# Patient Record
Sex: Female | Born: 1980 | Hispanic: Yes | Marital: Married | Smoking: Never smoker
Health system: Southern US, Community
[De-identification: ages and names within clinical notes are randomized; demographics above are authoritative.]

## PROBLEM LIST (undated history)

## (undated) DIAGNOSIS — Z8041 Family history of malignant neoplasm of ovary: Secondary | ICD-10-CM

## (undated) DIAGNOSIS — D392 Neoplasm of uncertain behavior of placenta: Secondary | ICD-10-CM

## (undated) DIAGNOSIS — J029 Acute pharyngitis, unspecified: Secondary | ICD-10-CM

## (undated) DIAGNOSIS — Z1509 Genetic susceptibility to other malignant neoplasm: Principal | ICD-10-CM

## (undated) DIAGNOSIS — C801 Malignant (primary) neoplasm, unspecified: Secondary | ICD-10-CM

## (undated) DIAGNOSIS — Z1501 Genetic susceptibility to malignant neoplasm of breast: Secondary | ICD-10-CM

## (undated) HISTORY — PX: DILATION AND CURETTAGE OF UTERUS: SHX78

## (undated) HISTORY — DX: Family history of malignant neoplasm of ovary: Z80.41

## (undated) HISTORY — PX: BREAST BIOPSY: SHX20

## (undated) HISTORY — DX: Genetic susceptibility to malignant neoplasm of breast: Z15.09

## (undated) HISTORY — PX: WISDOM TOOTH EXTRACTION: SHX21

## (undated) HISTORY — DX: Neoplasm of uncertain behavior of placenta: D39.2

## (undated) HISTORY — DX: Genetic susceptibility to malignant neoplasm of breast: Z15.01

---

## 2001-10-17 ENCOUNTER — Ambulatory Visit (HOSPITAL_COMMUNITY): Admission: RE | Admit: 2001-10-17 | Discharge: 2001-10-17 | Payer: Self-pay | Admitting: Obstetrics and Gynecology

## 2001-10-17 ENCOUNTER — Encounter: Payer: Self-pay | Admitting: Obstetrics and Gynecology

## 2002-02-25 ENCOUNTER — Inpatient Hospital Stay (HOSPITAL_COMMUNITY): Admission: AD | Admit: 2002-02-25 | Discharge: 2002-02-28 | Payer: Self-pay | Admitting: *Deleted

## 2004-02-23 ENCOUNTER — Other Ambulatory Visit: Admission: RE | Admit: 2004-02-23 | Discharge: 2004-02-23 | Payer: Self-pay | Admitting: Obstetrics and Gynecology

## 2004-08-30 ENCOUNTER — Inpatient Hospital Stay (HOSPITAL_COMMUNITY): Admission: AD | Admit: 2004-08-30 | Discharge: 2004-09-03 | Payer: Self-pay | Admitting: Obstetrics and Gynecology

## 2004-10-13 ENCOUNTER — Other Ambulatory Visit: Admission: RE | Admit: 2004-10-13 | Discharge: 2004-10-13 | Payer: Self-pay | Admitting: Obstetrics and Gynecology

## 2010-03-04 ENCOUNTER — Encounter: Payer: Self-pay | Admitting: Family Medicine

## 2010-03-04 ENCOUNTER — Ambulatory Visit (INDEPENDENT_AMBULATORY_CARE_PROVIDER_SITE_OTHER): Payer: 59 | Admitting: Family Medicine

## 2010-03-04 DIAGNOSIS — W460XXA Contact with hypodermic needle, initial encounter: Secondary | ICD-10-CM

## 2010-03-04 DIAGNOSIS — J019 Acute sinusitis, unspecified: Secondary | ICD-10-CM

## 2010-03-04 DIAGNOSIS — J018 Other acute sinusitis: Secondary | ICD-10-CM | POA: Insufficient documentation

## 2010-03-04 DIAGNOSIS — S4980XA Other specified injuries of shoulder and upper arm, unspecified arm, initial encounter: Secondary | ICD-10-CM

## 2010-03-04 LAB — CONVERTED CEMR LAB
HCV Ab: NEGATIVE
Hep B S Ab: POSITIVE — AB
Hepatitis B Surface Ag: NEGATIVE

## 2010-03-10 NOTE — Assessment & Plan Note (Signed)
Summary: NEW PT TO EST/CLE   Vital Signs:  Patient profile:   30 year old female Height:      67 inches Weight:      163.50 pounds BMI:     25.70 Temp:     97.9 degrees F oral Pulse rate:   86 / minute Pulse rhythm:   regular BP sitting:   118 / 80  (left arm) Cuff size:   regular  Vitals Entered By: Linde Gillis CMA Duncan Dull) (March 04, 2010 11:14 AM) CC: new patient, establish care   History of Present Illness: 30 yo here to establish care.  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 Six month labs were normal but was advised to have them repeated at 1 year. Would like them repeated today.  ?sinus infection- past month, has had a URI- started with runny nose. Now has sinus pressure and body aches. NO fevers or chills. No cough.  No CP.  She does not believe she has seasonal allergies but eyes do water at work from time to time.  Preventive Screening-Counseling & Management  Alcohol-Tobacco     Smoking Status: never  Caffeine-Diet-Exercise     Does Patient Exercise: yes      Drug Use:  no.    Current Medications (verified): 1)  Azithromycin 250 Mg  Tabs (Azithromycin) .... 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 Eyes:  Denies blurring. ENT:  Complains of nasal congestion and sinus pressure. CV:  Denies chest pain or discomfort. Resp:   Denies shortness of breath. GI:  Denies abdominal pain and change in bowel habits. GU:  Denies abnormal vaginal bleeding and dysuria. MS:  Denies joint pain, joint redness, and joint swelling. Derm:  Denies rash. Neuro:  Denies headaches. Psych:  Denies anxiety and depression. Endo:  Denies cold intolerance and heat intolerance. Heme:  Denies abnormal bruising and bleeding.  Physical Exam  General:  alert, well-developed, and well-nourished.   Head:  normocephalic and atraumatic.   Eyes:  vision grossly intact, pupils equal, and pupils round.   Ears:  R ear normal and L ear normal.   Nose:  boggy turbinates sinuses TTP- frontal, maxillary  Mouth:  good dentition and pharynx pink and moist.   Neck:  No deformities, masses, or tenderness noted. Lungs:  Normal respiratory effort, chest expands symmetrically. Lungs are clear to auscultation, no crackles or wheezes. Heart:  Normal rate and regular rhythm. S1 and S2 normal without gallop, murmur, click, rub or other extra sounds. Abdomen:  Bowel sounds positive,abdomen soft and non-tender without masses, organomegaly or hernias noted. Msk:  No deformity or scoliosis noted of thoracic or lumbar spine.   Extremities:  No clubbing, cyanosis, edema, or deformity noted with normal full range of motion of all joints.   Neurologic:  No cranial nerve deficits noted. Station and gait are normal. Plantar reflexes are down-going  bilaterally. DTRs are symmetrical throughout. Sensory, motor and coordinative functions appear intact. Skin:  Intact without suspicious lesions or rashes Cervical Nodes:  No lymphadenopathy noted Psych:  Cognition and judgment appear intact. Alert and cooperative with normal attention span and concentration. 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 Orders: Venipuncture (04540) T-Hepatitis B Core Antibody, IGM (98119-14782) T-Hepatitis B Surface Antibody (95621-30865) T-Hepatitis C Antibody (78469-62952) T-Hepatitis B Surface Antigen (84132-44010) T-HIV Antibody  (Reflex) ([id number])  Problem # 2:  OTHER ACUTE SINUSITIS (ICD-461.8) Assessment: New Given duration and progression of symptoms, will treat for bacterial sinusitis with Zpack. Advised OTC decongestant/allergy medication as well. Her updated medication list for this problem includes:    Azithromycin 250 Mg Tabs (Azithromycin) .Marland Kitchen... 2 by  mouth today and then 1 daily for 4 days  Complete Medication List: 1)  Azithromycin 250 Mg Tabs (Azithromycin) .... 2 by  mouth today and then 1 daily for 4 days Prescriptions: AZITHROMYCIN 250 MG  TABS (AZITHROMYCIN) 2 by  mouth today and then 1 daily for 4 days  #6 x 0   Entered and Authorized by:   Ruthe Mannan MD   Signed by:   Ruthe Mannan MD on 03/04/2010   Method used:   Electronically to        [facility]  [address] [id number] (retail)       [address]       [place], [place]  [address]       Ph: [phone] or [phone]       Fax: [phone]   RxID:   [id number]    Orders Added: 1)  Venipuncture [37628] 2)  T-Hepatitis B Core Antibody, IGM [86705-23580] 3)  T-Hepatitis B Surface Antibody [[id number]] 4)  T-Hepatitis C Antibody [86803-23620] 5)  T-Hepatitis B Surface Antigen [37106-26948] 6)  T-HIV Antibody  (Reflex) [[id number]] 7)  New Patient Level II [99202]    Prior Medications (reviewed today): None Current Allergies (reviewed today): No known allergies

## 2010-03-22 ENCOUNTER — Encounter: Payer: Self-pay | Admitting: Family Medicine

## 2010-04-13 NOTE — Letter (Signed)
Summary: [facility]   Imported By: Kassie Mends 04/05/2010 11:14:44  _____________________________________________________________________  External Attachment:    Type:   Image     Comment:   External Document

## 2010-08-09 ENCOUNTER — Encounter (HOSPITAL_COMMUNITY): Payer: Self-pay | Admitting: *Deleted

## 2010-08-09 ENCOUNTER — Ambulatory Visit (HOSPITAL_COMMUNITY)
Admit: 2010-08-09 | Discharge: 2010-08-09 | Disposition: A | Discharge status: Home / Self Care | Payer: 59 | Source: Ambulatory Visit | Attending: Obstetrics and Gynecology | Admitting: Obstetrics and Gynecology

## 2010-08-10 ENCOUNTER — Other Ambulatory Visit (HOSPITAL_COMMUNITY): Payer: Self-pay | Admitting: Obstetrics and Gynecology

## 2010-08-10 ENCOUNTER — Ambulatory Visit (HOSPITAL_COMMUNITY)
Admission: RE | Admit: 2010-08-10 | Discharge: 2010-08-10 | Disposition: A | Discharge status: Home / Self Care | Payer: 59 | Source: Ambulatory Visit | Attending: Obstetrics and Gynecology | Admitting: Obstetrics and Gynecology

## 2010-08-10 DIAGNOSIS — O020 Blighted ovum and nonhydatidiform mole: Secondary | ICD-10-CM

## 2010-08-10 DIAGNOSIS — O0289 Other abnormal products of conception: Secondary | ICD-10-CM | POA: Insufficient documentation

## 2010-08-10 DIAGNOSIS — O019 Hydatidiform mole, unspecified: Secondary | ICD-10-CM | POA: Insufficient documentation

## 2010-08-10 DIAGNOSIS — Z01811 Encounter for preprocedural respiratory examination: Secondary | ICD-10-CM | POA: Insufficient documentation

## 2010-08-10 NOTE — Anesthesia Preprocedure Evaluation (Addendum)
Anesthesia Evaluation  Name, MR# and DOB Patient awake  General Assessment Comment  Reviewed: Allergy & Precautions, H&P  and Patient's Chart, lab work & pertinent test results  Airway Mallampati: II TM Distance: >3 FB Neck ROM: Full    Dental No notable dental hx (+) Teeth Intact   Pulmonaryneg pulmonary ROS    clear to auscultation  pulmonary exam normal   Cardiovascular Regular Normal   Neuro/Psych  GI/Hepatic/Renal negative GI ROS, negative Liver ROS, and negative Renal ROS (+)       Endo/Other  Negative Endocrine ROS (+)   Abdominal Normal abdominal exam  (+)   Musculoskeletal negative musculoskeletal ROS (+)  Hematology negative hematology ROS (+)   Peds  Reproductive/Obstetrics (+) Pregnancy (Suspected Molar Pregnancy. Hcg measures 22wks)   Anesthesia Other Findings             Anesthesia Physical Anesthesia Plan  ASA: II  Anesthesia Plan: General   Post-op Pain Management:    Induction: Intravenous  Airway Management Planned: Oral ETT  Additional Equipment:   Intra-op Plan:   Post-operative Plan: Extubation in OR  Informed Consent: I have reviewed the patients History and Physical, chart, labs and discussed the procedure including the risks, benefits and alternatives for the proposed anesthesia with the patient or authorized representative who has indicated his/her understanding and acceptance.   Dental advisory given  Plan Discussed with:   Anesthesia Plan Comments:         Anesthesia Quick Evaluation

## 2010-08-12 ENCOUNTER — Encounter (HOSPITAL_COMMUNITY): Payer: Self-pay | Admitting: Anesthesiology

## 2010-08-12 ENCOUNTER — Other Ambulatory Visit: Payer: Self-pay | Admitting: Obstetrics and Gynecology

## 2010-08-12 ENCOUNTER — Ambulatory Visit (HOSPITAL_COMMUNITY)
Admission: RE | Admit: 2010-08-12 | Discharge: 2010-08-12 | Disposition: A | Discharge status: Home / Self Care | Payer: 59 | Source: Ambulatory Visit | Attending: Obstetrics and Gynecology | Admitting: Obstetrics and Gynecology

## 2010-08-12 ENCOUNTER — Ambulatory Visit (HOSPITAL_COMMUNITY): Payer: 59 | Admitting: Anesthesiology

## 2010-08-12 ENCOUNTER — Encounter (HOSPITAL_COMMUNITY)
Admission: RE | Disposition: A | Discharge status: Home / Self Care | Payer: Self-pay | Source: Ambulatory Visit | Attending: Obstetrics and Gynecology

## 2010-08-12 ENCOUNTER — Ambulatory Visit (HOSPITAL_COMMUNITY)
Admission: RE | Admit: 2010-08-12 | Discharge: 2010-08-12 | Disposition: A | Discharge status: Home / Self Care | Payer: 59 | Source: Ambulatory Visit

## 2010-08-12 ENCOUNTER — Encounter (HOSPITAL_COMMUNITY): Payer: Self-pay | Admitting: *Deleted

## 2010-08-12 DIAGNOSIS — O021 Missed abortion: Secondary | ICD-10-CM | POA: Insufficient documentation

## 2010-08-12 HISTORY — PX: DILATION AND EVACUATION: SHX1459

## 2010-08-12 LAB — CBC
HCT: 30.9 % — ABNORMAL LOW (ref 36.0–46.0)
HCT: 29.9 % — ABNORMAL LOW (ref 36.0–46.0)
Hemoglobin: 10.7 g/dL — ABNORMAL LOW (ref 12.0–15.0)
Hemoglobin: 10.3 g/dL — ABNORMAL LOW (ref 12.0–15.0)
MCH: 30.6 pg (ref 26.0–34.0)
MCH: 30.6 pg (ref 26.0–34.0)
MCHC: 34.6 g/dL (ref 30.0–36.0)
RBC: 3.5 MIL/uL — ABNORMAL LOW (ref 3.87–5.11)
RBC: 3.37 MIL/uL — ABNORMAL LOW (ref 3.87–5.11)

## 2010-08-12 LAB — TYPE AND SCREEN: Antibody Screen: NEGATIVE

## 2010-08-12 LAB — SURGICAL PCR SCREEN
MRSA, PCR: NEGATIVE
Staphylococcus aureus: NEGATIVE

## 2010-08-12 LAB — ABO/RH: ABO/RH(D): O POS

## 2010-08-12 SURGERY — DILATION AND EVACUATION, UTERUS
Anesthesia: General | Site: Vagina | Wound class: Clean Contaminated

## 2010-08-12 MED ORDER — FENTANYL CITRATE 0.05 MG/ML IJ SOLN: 25.0000 ug | INTRAMUSCULAR | Status: DC | PRN

## 2010-08-12 MED ORDER — PROPOFOL 10 MG/ML IV EMUL
INTRAVENOUS | Status: DC | PRN
  Administered 2010-08-12: 180 mg via INTRAVENOUS

## 2010-08-12 MED ORDER — FENTANYL CITRATE 0.05 MG/ML IJ SOLN
INTRAMUSCULAR | Status: AC
  Filled 2010-08-12: qty 5

## 2010-08-12 MED ORDER — PROPOFOL 10 MG/ML IV EMUL
INTRAVENOUS | Status: AC
  Filled 2010-08-12: qty 20

## 2010-08-12 MED ORDER — LIDOCAINE HCL (CARDIAC) 10 MG/ML IV SOLN
INTRAVENOUS | Status: DC | PRN
  Administered 2010-08-12: 50 mg via INTRAVENOUS

## 2010-08-12 MED ORDER — NEOSTIGMINE METHYLSULFATE 1 MG/ML IJ SOLN
INTRAMUSCULAR | Status: DC | PRN
  Administered 2010-08-12: 3 mg via INTRAMUSCULAR

## 2010-08-12 MED ORDER — MIDAZOLAM HCL 2 MG/2ML IJ SOLN
INTRAMUSCULAR | Status: AC
  Filled 2010-08-12: qty 2

## 2010-08-12 MED ORDER — LACTATED RINGERS IV SOLN
INTRAVENOUS | Status: DC
  Administered 2010-08-12 (×2): via INTRAVENOUS

## 2010-08-12 MED ORDER — DEXAMETHASONE SODIUM PHOSPHATE 10 MG/ML IJ SOLN
INTRAMUSCULAR | Status: AC
  Filled 2010-08-12: qty 1

## 2010-08-12 MED ORDER — SCOPOLAMINE 1 MG/3DAYS TD PT72
MEDICATED_PATCH | TRANSDERMAL | Status: AC
  Administered 2010-08-12: 1.5 mg via TRANSDERMAL
  Filled 2010-08-12: qty 1

## 2010-08-12 MED ORDER — FENTANYL CITRATE 0.05 MG/ML IJ SOLN
INTRAMUSCULAR | Status: DC | PRN
  Administered 2010-08-12: 50 ug via INTRAVENOUS
  Administered 2010-08-12: 15 ug via INTRAVENOUS

## 2010-08-12 MED ORDER — NEOSTIGMINE METHYLSULFATE 1 MG/ML IJ SOLN
INTRAMUSCULAR | Status: AC
  Filled 2010-08-12: qty 10

## 2010-08-12 MED ORDER — GLYCOPYRROLATE 0.2 MG/ML IJ SOLN
INTRAMUSCULAR | Status: AC
  Filled 2010-08-12: qty 1

## 2010-08-12 MED ORDER — OXYTOCIN 10 UNIT/ML IJ SOLN
INTRAMUSCULAR | Status: AC
  Filled 2010-08-12: qty 2

## 2010-08-12 MED ORDER — MIDAZOLAM HCL 5 MG/5ML IJ SOLN
INTRAMUSCULAR | Status: DC | PRN
  Administered 2010-08-12: 2 mg via INTRAVENOUS

## 2010-08-12 MED ORDER — CARBOPROST TROMETHAMINE 250 MCG/ML IM SOLN
INTRAMUSCULAR | Status: AC
  Filled 2010-08-12: qty 1

## 2010-08-12 MED ORDER — LIDOCAINE HCL (CARDIAC) 20 MG/ML IV SOLN
INTRAVENOUS | Status: AC
  Filled 2010-08-12: qty 5

## 2010-08-12 MED ORDER — ONDANSETRON HCL 4 MG/2ML IJ SOLN
INTRAMUSCULAR | Status: AC
  Filled 2010-08-12: qty 2

## 2010-08-12 MED ORDER — SCOPOLAMINE 1 MG/3DAYS TD PT72
1.0000 | MEDICATED_PATCH | TRANSDERMAL | Status: DC
  Administered 2010-08-12: 1.5 mg via TRANSDERMAL
  Filled 2010-08-12: qty 1

## 2010-08-12 MED ORDER — ROCURONIUM BROMIDE 100 MG/10ML IV SOLN
INTRAVENOUS | Status: DC | PRN
  Administered 2010-08-12: 30 mg via INTRAVENOUS

## 2010-08-12 MED ORDER — SODIUM CHLORIDE 0.9 % IR SOLN
Status: DC | PRN
  Administered 2010-08-12: 1000 mL

## 2010-08-12 MED ORDER — MUPIROCIN 2 % EX OINT
TOPICAL_OINTMENT | CUTANEOUS | Status: AC
  Administered 2010-08-12: 1 via NASAL
  Filled 2010-08-12: qty 22

## 2010-08-12 MED ORDER — METHYLERGONOVINE MALEATE 0.2 MG PO TABS: 0.2000 mg | ORAL_TABLET | Freq: Three times a day (TID) | ORAL | Status: DC | PRN

## 2010-08-12 MED ORDER — DEXAMETHASONE SODIUM PHOSPHATE 10 MG/ML IJ SOLN
INTRAMUSCULAR | Status: DC | PRN
  Administered 2010-08-12: 10 mg via INTRAVENOUS

## 2010-08-12 MED ORDER — GLYCOPYRROLATE 0.2 MG/ML IJ SOLN
INTRAMUSCULAR | Status: DC | PRN
  Administered 2010-08-12: 0.3 mg via INTRAVENOUS

## 2010-08-12 MED ORDER — ONDANSETRON HCL 4 MG/2ML IJ SOLN
INTRAMUSCULAR | Status: DC | PRN
  Administered 2010-08-12: 4 mg via INTRAVENOUS

## 2010-08-12 MED ORDER — OXYTOCIN 20 UNITS IN LACTATED RINGERS INFUSION - SIMPLE
INTRAVENOUS | Status: DC | PRN
  Administered 2010-08-12: 20 [IU] via INTRAVENOUS

## 2010-08-12 MED ORDER — CARBOPROST TROMETHAMINE 250 MCG/ML IM SOLN
INTRAMUSCULAR | Status: DC | PRN
  Administered 2010-08-12: 250 ug via INTRAMUSCULAR

## 2010-08-12 SURGICAL SUPPLY — 60 items
CANISTER SUCTION 2500CC (MISCELLANEOUS) ×3 IMPLANT
CATH FOLEY 3WAY  5CC 16FR (CATHETERS)
CATH FOLEY 3WAY 5CC 16FR (CATHETERS) IMPLANT
CATH ROBINSON RED A/P 16FR (CATHETERS) IMPLANT
CLOTH BEACON ORANGE TIMEOUT ST (SAFETY) ×3 IMPLANT
CONT PATH 16OZ SNAP LID 3702 (MISCELLANEOUS) IMPLANT
CONTAINER PREFILL 10% NBF 15ML (MISCELLANEOUS) IMPLANT
COUNTER NEEDLE 1200 MAGNETIC (NEEDLE) ×3 IMPLANT
DECANTER SPIKE VIAL GLASS SM (MISCELLANEOUS) ×3 IMPLANT
DRAPE CAMERA CLOSED 9X96 (DRAPES) IMPLANT
DRAPE UTILITY XL STRL (DRAPES) ×3 IMPLANT
DRSG VASELINE 3X18 (GAUZE/BANDAGES/DRESSINGS) IMPLANT
ELECT BLADE 6 FLAT ULTRCLN (ELECTRODE) IMPLANT
FILTER STRAW FLUID ASPIR (MISCELLANEOUS) IMPLANT
GAUZE SPONGE 4X4 12PLY STRL LF (GAUZE/BANDAGES/DRESSINGS) IMPLANT
GAUZE SPONGE 4X4 16PLY XRAY LF (GAUZE/BANDAGES/DRESSINGS) IMPLANT
GLOVE BIO SURGEON STRL SZ7.5 (GLOVE) ×6 IMPLANT
GOWN BRE IMP SLV AUR LG STRL (GOWN DISPOSABLE) ×6 IMPLANT
GOWN BRE IMP SLV AUR XL STRL (GOWN DISPOSABLE) ×3 IMPLANT
KIT BERKELEY 1ST TRIMESTER 3/8 (MISCELLANEOUS) ×3 IMPLANT
NEEDLE HYPO 25X1 1.5 SAFETY (NEEDLE) IMPLANT
NEEDLE SPNL 22GX3.5 QUINCKE BK (NEEDLE) ×3 IMPLANT
NS IRRIG 1000ML POUR BTL (IV SOLUTION) ×3 IMPLANT
PACK ABDOMINAL GYN (CUSTOM PROCEDURE TRAY) IMPLANT
PACK VAGINAL MINOR WOMEN LF (CUSTOM PROCEDURE TRAY) ×3 IMPLANT
PAD OB MATERNITY 4.3X12.25 (PERSONAL CARE ITEMS) ×3 IMPLANT
PAD PREP 24X48 CUFFED NSTRL (MISCELLANEOUS) ×3 IMPLANT
PLUG CATH AND CAP STER (CATHETERS) IMPLANT
RETRACTOR WND ALEXIS 25 LRG (MISCELLANEOUS) IMPLANT
RTRCTR WOUND ALEXIS 25CM LRG (MISCELLANEOUS)
SET BERKELEY SUCTION TUBING (SUCTIONS) ×3 IMPLANT
SET CYSTO W/LG BORE CLAMP LF (SET/KITS/TRAYS/PACK) IMPLANT
SPONGE LAP 18X18 X RAY DECT (DISPOSABLE) IMPLANT
SPONGE LAP 4X18 X RAY DECT (DISPOSABLE) IMPLANT
STAPLER VISISTAT 35W (STAPLE) IMPLANT
SUT CHROMIC 2 0 CT 1 (SUTURE) ×3 IMPLANT
SUT PDS AB 0 CTX 60 (SUTURE) IMPLANT
SUT VIC AB 0 CT1 18XCR BRD8 (SUTURE) IMPLANT
SUT VIC AB 0 CT1 27 (SUTURE)
SUT VIC AB 0 CT1 27XBRD ANBCTR (SUTURE) IMPLANT
SUT VIC AB 0 CT1 36 (SUTURE) IMPLANT
SUT VIC AB 0 CT1 8-18 (SUTURE)
SUT VIC AB 3-0 CTX 36 (SUTURE) IMPLANT
SUT VIC AB 3-0 SH 27 (SUTURE)
SUT VIC AB 3-0 SH 27X BRD (SUTURE) IMPLANT
SUT VICRYL 0 TIES 12 18 (SUTURE) IMPLANT
SYR 3ML LL SCALE MARK (SYRINGE) IMPLANT
SYR CONTROL 10ML LL (SYRINGE) ×3 IMPLANT
TOWEL OR 17X24 6PK STRL BLUE (TOWEL DISPOSABLE) ×6 IMPLANT
TRAY FOLEY CATH 14FR (SET/KITS/TRAYS/PACK) ×3 IMPLANT
TUBE VACURETTE 2ND TRIMESTER (CANNULA) ×3 IMPLANT
TUBING NON-CON 1/4 X 20 CONN (TUBING) ×3 IMPLANT
VACURETTE 10 RIGID CVD (CANNULA) IMPLANT
VACURETTE 12 RIGID CVD (CANNULA) ×3 IMPLANT
VACURETTE 14MM CVD 1/2 BASE (CANNULA) ×3 IMPLANT
VACURETTE 7MM CVD STRL WRAP (CANNULA) IMPLANT
VACURETTE 8 RIGID CVD (CANNULA) IMPLANT
VACURETTE 9 RIGID CVD (CANNULA) IMPLANT
WATER STERILE IRR 1000ML POUR (IV SOLUTION) IMPLANT
YANKAUER SUCT BULB TIP NO VENT (SUCTIONS) ×3 IMPLANT

## 2010-08-12 NOTE — Op Note (Signed)
This is Dr. Ambrose Mantle dictating an operative note on  [name] date of the operation 08/12/2010  Preoperative diagnosis: Molar pregnancy  Postoperative diagnosis: Same  Operation: Suction D&C followed by sharp D&C both under ultrasound guidance.  Anesthesia: Gen. By Dr. Malen Gauze  The patient was brought to the operating room placed under satisfactory general anesthesia and placed in lithotomy position in the [facility] stirrups. The ultrasound technologist was  in the room and identified the molar pregnancy. The uterus was approximately 18-20 weeks size and was palpable at the umbilicus. The vulva vagina, perineum, lower abdomen, and medial thighs were prepped with Betadine solution. The cervix was grasped with a single-tooth tenaculum and the cervical canal was dilated up to a 43 dilator. A #14 suction curette was inserted into the uterine cavity under US guidance . We encountered fairly significant bleeding as I was dilating the cervix but after I began the suction the bleeding diminished. The uterine size rapidly became almost normal. Pitocin was begun in the IV fluid when I began to dilate the cervix and during the procedure the anesthetist gave 1 ampule of Hemabate. At this point the endometrial thickness at the fundus was a little over 2 cm so I went back under ultrasound guidance and tried to evacuate all tissue that was present I massaged the uterus to obtain hemostasis. There did not seem to be any significant amount of tissue left in the uterus at the end of the procedure. I sutured one tenaculum site for hemostasis and the procedure was terminated. I inspected the products of conception and the tissue appeared to be consistent with placenta. I did not inspect the entire specimen to see if there was anything that appeared similar. to grapes.I inspected both suction devices measured the fluid present and the total was 825 cc. I do not know what percentage of that was new and old blood the patient was  returned to recovery in satisfactory condition without complications.

## 2010-08-12 NOTE — H&P (Signed)
Dictated on 08-11-10. Will be added to Epic after surgery.

## 2010-08-12 NOTE — Transfer of Care (Signed)
Immediate Anesthesia Transfer of Care Note  Patient: [name]  Procedure(s) Performed:  DILATATION AND EVACUATION (D&E)  Patient Location: PACU  Anesthesia Type: General  Level of Consciousness: awake, alert  and oriented  Airway & Oxygen Therapy: Patient Spontanous Breathing and Patient connected to nasal cannula oxygen  Post-op Assessment: Report given to PACU RN  Post vital signs: Reviewed and stable  Complications: No apparent anesthesia complications

## 2010-08-12 NOTE — Anesthesia Postprocedure Evaluation (Signed)
  Anesthesia Post-op Note  Patient: [name]  Procedure(s) Performed:  DILATATION AND EVACUATION (D&E)  Patient Location: PACU  Anesthesia Type: General  Level of Consciousness: awake, alert  and oriented  Airway and Oxygen Therapy: Patient Spontanous Breathing  Post-op Pain: none  Post-op Assessment: Post-op Vital signs reviewed, Patient's Cardiovascular Status Stable, Respiratory Function Stable, Patent Airway, No signs of Nausea or vomiting and Pain level controlled  Post-op Vital Signs: Reviewed and stable  Complications: No apparent anesthesia complications

## 2010-08-13 MED ORDER — LACTATED RINGERS IV SOLN: INTRAVENOUS | Status: DC

## 2010-08-13 NOTE — H&P (Signed)
NAMECONSWELLA, [name]             ACCOUNT NO.:  [id number]  MEDICAL RECORD NO.:  [id number]  LOCATION:                                 FACILITY:  PHYSICIAN:  Malachi Pro. Ambrose Mantle, M.D.      DATE OF BIRTH:  DATE OF ADMISSION:  08/12/2010 DATE OF DISCHARGE:                             HISTORY & PHYSICAL   HISTORY OF PRESENT ILLNESS:  This is a 30 year old Hispanic female para 2-0-0-2, gravida 3 who is admitted for removal of a molar pregnancy. The patient's last menstrual period was May 31, 2010.  A beta-HCG on July 30, 2010 was 244,[phone].  An ultrasound was performed that showed a molar pregnancy enlarging the uterus up to the umbilicus.  She has had some bleeding that began approximately the middle of June and actually on August 10, 2010 had some small clots.  PAST MEDICAL HISTORY:  ALLERGIES:  No known allergies.  PAST SURGIES:  She has had 2 C-sections.  PAST ILLNESSES:  She has had no significant illnesses.  SOCIAL HISTORY:  She drinks occasionally.  Does not smoke or take drugs.  FAMILY HISTORY:  Mother is 6, living and well.  Father 70, unknown health.  One brother and sister are living and well.  OBSTETRICAL HISTORY:  In 2004, the patient had a primary C-section with nonreassuring fetal heart rate pattern and in 2006, she had a failed VBAC and had the C-section for fetal tachycardia nonreassuring, tracing meconium, and occiput posterior presentation.  PHYSICAL EXAMINATION:  VITAL SIGNS:  Her blood pressure is 120/78, weight is 170, and pulse is 70. HEAD, EYES, EARS, NOSE AND THROAT:  Normal. NECK:  Supple without thyromegaly. LUNGS:  Clear to auscultation. HEART:  Normal size and sounds.  No murmurs. ABDOMEN:  Soft.  The fundal height was 12 cm on August 06, 2010, today July 11 is 13 cm.  The vulva and vagina are normal.  There is a slight amount of bloody discharge in the vagina.  The cervix is closed.  Uterus is anterior, felt to be 18-20 weeks size.  The adnexa are  free of masses as far as they can be examined.  LABORATORY DATA:  The patient's TSH was markedly depressed suggesting hyperthyroidism but her free T4 was only 1.8 which was 300s of a point above the upper limit of normal.  She has no signs or symptoms of hyperthyroidism.  A chest x-ray was normal.  Her CBC and comprehensive metabolic profile were normal.  She has been typed and screened.  ASSESSMENT AND PLAN:  We will proceed with suction dilatation and curettage ultrasound control.  If that is unsuccessful, we will proceed with hysterotomy and possible hysterectomy.  The patient has been informed of the risks of surgery including but not limited to deep venous thrombosis, hemorrhage with need for reoperation or hysterectomy or hysterotomy.  She has been informed that there could be injury to surrounding tissues including the bowel, bladder, and ureter and she understands the potential that this could eventuate in a malignancy and the need for contraception if the uterus is preserved and for close followup to ensure that her HCG titers return to normal.     Malachi Pro.  Ambrose Mantle, M.D.     TFH/MEDQ  D:  08/11/2010  T:  08/11/2010  Job:  [id number]

## 2010-08-31 ENCOUNTER — Encounter (HOSPITAL_COMMUNITY): Payer: Self-pay | Admitting: Obstetrics and Gynecology

## 2010-09-06 ENCOUNTER — Other Ambulatory Visit (HOSPITAL_COMMUNITY): Payer: Self-pay | Admitting: Obstetrics and Gynecology

## 2010-09-06 DIAGNOSIS — O020 Blighted ovum and nonhydatidiform mole: Secondary | ICD-10-CM

## 2010-09-10 ENCOUNTER — Ambulatory Visit (HOSPITAL_COMMUNITY)
Admission: RE | Admit: 2010-09-10 | Discharge: 2010-09-10 | Disposition: A | Discharge status: Home / Self Care | Payer: 59 | Source: Ambulatory Visit | Attending: Obstetrics and Gynecology | Admitting: Obstetrics and Gynecology

## 2010-09-10 ENCOUNTER — Other Ambulatory Visit (HOSPITAL_COMMUNITY): Payer: Self-pay | Admitting: Obstetrics and Gynecology

## 2010-09-10 DIAGNOSIS — O020 Blighted ovum and nonhydatidiform mole: Secondary | ICD-10-CM

## 2010-09-10 DIAGNOSIS — O0289 Other abnormal products of conception: Secondary | ICD-10-CM | POA: Insufficient documentation

## 2010-09-13 ENCOUNTER — Other Ambulatory Visit (HOSPITAL_COMMUNITY): Payer: Self-pay | Admitting: Obstetrics and Gynecology

## 2010-09-13 ENCOUNTER — Encounter (HOSPITAL_COMMUNITY): Payer: Self-pay

## 2010-09-13 ENCOUNTER — Ambulatory Visit (HOSPITAL_COMMUNITY)
Admission: RE | Admit: 2010-09-13 | Discharge: 2010-09-13 | Disposition: A | Discharge status: Home / Self Care | Payer: 59 | Source: Ambulatory Visit | Attending: Obstetrics and Gynecology | Admitting: Obstetrics and Gynecology

## 2010-09-13 DIAGNOSIS — O020 Blighted ovum and nonhydatidiform mole: Secondary | ICD-10-CM

## 2010-09-13 DIAGNOSIS — O019 Hydatidiform mole, unspecified: Secondary | ICD-10-CM | POA: Insufficient documentation

## 2010-09-13 HISTORY — DX: Malignant (primary) neoplasm, unspecified: C80.1

## 2010-09-13 MED ORDER — IOHEXOL 300 MG/ML  SOLN: 100.0000 mL | Freq: Once | INTRAMUSCULAR | Status: DC | PRN

## 2010-09-15 ENCOUNTER — Inpatient Hospital Stay (HOSPITAL_COMMUNITY)
Admission: AD | Admit: 2010-09-15 | Discharge: 2010-09-15 | Disposition: A | Discharge status: Home / Self Care | Payer: 59 | Source: Ambulatory Visit | Attending: Obstetrics and Gynecology | Admitting: Obstetrics and Gynecology

## 2010-09-15 DIAGNOSIS — O00109 Unspecified tubal pregnancy without intrauterine pregnancy: Secondary | ICD-10-CM | POA: Insufficient documentation

## 2010-09-15 LAB — HCG, QUANTITATIVE, PREGNANCY: hCG, Beta Chain, Quant, S: 25683 m[IU]/mL — ABNORMAL HIGH (ref <5)

## 2010-09-15 MED ORDER — METHOTREXATE INJECTION FOR WOMEN'S HOSPITAL
50.0000 mg/m2 | Freq: Once | INTRAMUSCULAR | Status: AC
  Administered 2010-09-15: 95 mg via INTRAMUSCULAR
  Filled 2010-09-15: qty 1.9

## 2010-09-15 MED ORDER — METHOTREXATE INJECTION FOR WOMEN'S HOSPITAL: 50.0000 mg/m2 | INTRAMUSCULAR | Status: DC

## 2010-09-15 NOTE — Initial Assessments (Signed)
MTX given. Pt tolerated well. D/C home. F/U in one week with Dr. Ambrose Mantle.

## 2010-09-15 NOTE — Progress Notes (Signed)
Pt is here for methotrexate injection

## 2010-09-16 ENCOUNTER — Other Ambulatory Visit: Payer: Self-pay | Admitting: Obstetrics and Gynecology

## 2010-09-16 MED ORDER — METHOTREXATE SODIUM CHEMO INJECTION 25 MG/ML PF: 50.0000 mg/m2 | INTRAMUSCULAR | Status: DC

## 2010-09-22 ENCOUNTER — Other Ambulatory Visit (HOSPITAL_COMMUNITY): Payer: Self-pay | Admitting: Pharmacist

## 2010-09-22 ENCOUNTER — Inpatient Hospital Stay (HOSPITAL_COMMUNITY): Admission: RE | Admit: 2010-09-22 | Payer: 59 | Source: Ambulatory Visit

## 2010-09-22 ENCOUNTER — Other Ambulatory Visit (HOSPITAL_COMMUNITY): Payer: Self-pay | Admitting: *Deleted

## 2010-09-22 ENCOUNTER — Inpatient Hospital Stay (HOSPITAL_COMMUNITY)
Admission: AD | Admit: 2010-09-22 | Discharge: 2010-09-22 | Disposition: A | Discharge status: Home / Self Care | Payer: 59 | Source: Ambulatory Visit | Attending: Obstetrics and Gynecology | Admitting: Obstetrics and Gynecology

## 2010-09-22 DIAGNOSIS — O00109 Unspecified tubal pregnancy without intrauterine pregnancy: Secondary | ICD-10-CM | POA: Insufficient documentation

## 2010-09-22 LAB — HCG, QUANTITATIVE, PREGNANCY: hCG, Beta Chain, Quant, S: 25658 m[IU]/mL — ABNORMAL HIGH (ref <5)

## 2010-09-22 LAB — COMPREHENSIVE METABOLIC PANEL
AST: 21 U/L (ref 0–37)
Albumin: 4 g/dL (ref 3.5–5.2)
BUN: 7 mg/dL (ref 6–23)
Calcium: 9.2 mg/dL (ref 8.4–10.5)
Chloride: 102 mEq/L (ref 96–112)
Creatinine, Ser: 0.49 mg/dL — ABNORMAL LOW (ref 0.50–1.10)
GFR calc non Af Amer: >60 mL/min (ref >60)
Total Bilirubin: 0.3 mg/dL (ref 0.3–1.2)

## 2010-09-22 LAB — CBC
Hemoglobin: 10.6 g/dL — ABNORMAL LOW (ref 12.0–15.0)
MCV: 88.2 fL (ref 78.0–100.0)
Platelets: 206 10*3/uL (ref 150–400)
RBC: 3.57 MIL/uL — ABNORMAL LOW (ref 3.87–5.11)
WBC: 4.5 10*3/uL (ref 4.0–10.5)

## 2010-09-22 LAB — DIFFERENTIAL
Lymphocytes Relative: 25 % (ref 12–46)
Lymphs Abs: 1.1 10*3/uL (ref 0.7–4.0)
Monocytes Relative: 7 % (ref 3–12)
Neutrophils Relative %: 67 % (ref 43–77)

## 2010-09-22 MED ORDER — METHOTREXATE INJECTION FOR WOMEN'S HOSPITAL
50.0000 mg/m2 | INTRAMUSCULAR | Status: DC
  Administered 2010-09-22: 95 mg via INTRAMUSCULAR
  Filled 2010-09-22: qty 1.9

## 2010-09-22 NOTE — Progress Notes (Signed)
Pt to MAU for weekly lab work and Methotrexate injection. Pt states no pain and slight bleeding on and off.

## 2010-09-29 ENCOUNTER — Inpatient Hospital Stay (HOSPITAL_COMMUNITY)
Admission: AD | Admit: 2010-09-29 | Discharge: 2010-09-29 | Disposition: A | Discharge status: Home / Self Care | Payer: 59 | Source: Ambulatory Visit | Attending: Obstetrics and Gynecology | Admitting: Obstetrics and Gynecology

## 2010-09-29 ENCOUNTER — Ambulatory Visit (HOSPITAL_COMMUNITY): Payer: 59

## 2010-09-29 ENCOUNTER — Other Ambulatory Visit (HOSPITAL_COMMUNITY): Payer: Self-pay | Admitting: *Deleted

## 2010-09-29 DIAGNOSIS — O00109 Unspecified tubal pregnancy without intrauterine pregnancy: Secondary | ICD-10-CM | POA: Insufficient documentation

## 2010-09-29 LAB — COMPREHENSIVE METABOLIC PANEL
ALT: 12 U/L (ref 0–35)
BUN: 7 mg/dL (ref 6–23)
CO2: 25 mEq/L (ref 19–32)
Calcium: 9.2 mg/dL (ref 8.4–10.5)
GFR calc Af Amer: >60 mL/min (ref >60)
GFR calc non Af Amer: >60 mL/min (ref >60)
Glucose, Bld: 109 mg/dL — ABNORMAL HIGH (ref 70–99)
Total Protein: 6.9 g/dL (ref 6.0–8.3)

## 2010-09-29 LAB — CBC
HCT: 29.4 % — ABNORMAL LOW (ref 36.0–46.0)
Hemoglobin: 10.1 g/dL — ABNORMAL LOW (ref 12.0–15.0)
MCH: 30.2 pg (ref 26.0–34.0)
MCHC: 34.4 g/dL (ref 30.0–36.0)
MCV: 88 fL (ref 78.0–100.0)
RBC: 3.34 MIL/uL — ABNORMAL LOW (ref 3.87–5.11)

## 2010-09-29 LAB — HCG, QUANTITATIVE, PREGNANCY: hCG, Beta Chain, Quant, S: 14374 m[IU]/mL — ABNORMAL HIGH (ref <5)

## 2010-09-29 MED ORDER — METHOTREXATE INJECTION FOR WOMEN'S HOSPITAL
50.0000 mg/m2 | Freq: Once | INTRAMUSCULAR | Status: AC
  Administered 2010-09-29: 95 mg via INTRAMUSCULAR
  Filled 2010-09-29: qty 1.9

## 2010-09-29 NOTE — Progress Notes (Signed)
Pt to MAU for BHCG and MTX injection. Pt denies any pain but does have bleeding like a period.

## 2010-09-29 NOTE — Plan of Care (Signed)
Here for weekly MTX injection and labs

## 2010-10-06 ENCOUNTER — Inpatient Hospital Stay (HOSPITAL_COMMUNITY)
Admission: AD | Admit: 2010-10-06 | Discharge: 2010-10-06 | Disposition: A | Discharge status: Home / Self Care | Payer: 59 | Source: Ambulatory Visit | Attending: Obstetrics and Gynecology | Admitting: Obstetrics and Gynecology

## 2010-10-06 ENCOUNTER — Ambulatory Visit (HOSPITAL_COMMUNITY)
Admit: 2010-10-06 | Discharge: 2010-10-06 | Disposition: A | Discharge status: Home / Self Care | Payer: 59 | Attending: Obstetrics and Gynecology | Admitting: Obstetrics and Gynecology

## 2010-10-06 ENCOUNTER — Encounter (HOSPITAL_COMMUNITY): Payer: Self-pay | Admitting: *Deleted

## 2010-10-06 DIAGNOSIS — O00109 Unspecified tubal pregnancy without intrauterine pregnancy: Secondary | ICD-10-CM | POA: Insufficient documentation

## 2010-10-06 LAB — CBC
Hemoglobin: 10.2 g/dL — ABNORMAL LOW (ref 12.0–15.0)
MCH: 29.5 pg (ref 26.0–34.0)
MCHC: 33.2 g/dL (ref 30.0–36.0)
Platelets: 234 10*3/uL (ref 150–400)
RBC: 3.46 MIL/uL — ABNORMAL LOW (ref 3.87–5.11)

## 2010-10-06 LAB — COMPREHENSIVE METABOLIC PANEL
ALT: 11 U/L (ref 0–35)
AST: 20 U/L (ref 0–37)
Alkaline Phosphatase: 56 U/L (ref 39–117)
CO2: 27 mEq/L (ref 19–32)
Calcium: 9.5 mg/dL (ref 8.4–10.5)
Glucose, Bld: 67 mg/dL — ABNORMAL LOW (ref 70–99)
Potassium: 3.8 mEq/L (ref 3.5–5.1)
Sodium: 138 mEq/L (ref 135–145)
Total Protein: 7.1 g/dL (ref 6.0–8.3)

## 2010-10-06 MED ORDER — METHOTREXATE INJECTION FOR WOMEN'S HOSPITAL: 50.0000 mg/m2 | Freq: Once | INTRAMUSCULAR | Status: DC

## 2010-10-06 MED ORDER — METHOTREXATE INJECTION FOR WOMEN'S HOSPITAL
50.0000 mg/m2 | Freq: Once | INTRAMUSCULAR | Status: AC
  Administered 2010-10-06: 95 mg via INTRAMUSCULAR
  Filled 2010-10-06: qty 1.9

## 2010-10-06 NOTE — Progress Notes (Signed)
Post molar - getting wkly TMX injection.

## 2010-10-13 ENCOUNTER — Inpatient Hospital Stay (HOSPITAL_COMMUNITY)
Admission: AD | Admit: 2010-10-13 | Discharge: 2010-10-13 | Disposition: A | Discharge status: Home / Self Care | Payer: 59 | Source: Ambulatory Visit | Attending: Obstetrics and Gynecology | Admitting: Obstetrics and Gynecology

## 2010-10-13 DIAGNOSIS — O00109 Unspecified tubal pregnancy without intrauterine pregnancy: Secondary | ICD-10-CM | POA: Insufficient documentation

## 2010-10-13 LAB — CBC
HCT: 29.5 % — ABNORMAL LOW (ref 36.0–46.0)
Hemoglobin: 9.9 g/dL — ABNORMAL LOW (ref 12.0–15.0)
MCH: 29.6 pg (ref 26.0–34.0)
MCHC: 33.6 g/dL (ref 30.0–36.0)
MCV: 88.3 fL (ref 78.0–100.0)
RBC: 3.34 MIL/uL — ABNORMAL LOW (ref 3.87–5.11)

## 2010-10-13 LAB — COMPREHENSIVE METABOLIC PANEL
ALT: 11 U/L (ref 0–35)
BUN: 7 mg/dL (ref 6–23)
CO2: 25 mEq/L (ref 19–32)
Calcium: 8.9 mg/dL (ref 8.4–10.5)
Creatinine, Ser: 0.48 mg/dL — ABNORMAL LOW (ref 0.50–1.10)
GFR calc Af Amer: >60 mL/min (ref >60)
GFR calc non Af Amer: >60 mL/min (ref >60)
Glucose, Bld: 95 mg/dL (ref 70–99)
Total Protein: 6.9 g/dL (ref 6.0–8.3)

## 2010-10-13 LAB — DIFFERENTIAL
Basophils Absolute: 0.1 10*3/uL (ref 0.0–0.1)
Eosinophils Relative: 1 % (ref 0–5)
Lymphocytes Relative: 15 % (ref 12–46)
Neutro Abs: 4.8 10*3/uL (ref 1.7–7.7)
Neutrophils Relative %: 77 % (ref 43–77)

## 2010-10-13 LAB — HCG, QUANTITATIVE, PREGNANCY: hCG, Beta Chain, Quant, S: 8996 m[IU]/mL — ABNORMAL HIGH (ref <5)

## 2010-10-13 MED ORDER — METHOTREXATE INJECTION FOR WOMEN'S HOSPITAL
50.0000 mg/m2 | Freq: Once | INTRAMUSCULAR | Status: AC
  Administered 2010-10-13: 95 mg via INTRAMUSCULAR
  Filled 2010-10-13: qty 1.9

## 2010-10-13 NOTE — Progress Notes (Signed)
Pt to MAU for blood work and MTX injection

## 2010-10-20 ENCOUNTER — Inpatient Hospital Stay (HOSPITAL_COMMUNITY)
Admission: AD | Admit: 2010-10-20 | Discharge: 2010-10-20 | Disposition: A | Discharge status: Home / Self Care | Payer: 59 | Source: Ambulatory Visit | Attending: Obstetrics and Gynecology | Admitting: Obstetrics and Gynecology

## 2010-10-20 ENCOUNTER — Encounter (HOSPITAL_COMMUNITY): Payer: Self-pay

## 2010-10-20 DIAGNOSIS — O0289 Other abnormal products of conception: Secondary | ICD-10-CM | POA: Insufficient documentation

## 2010-10-20 DIAGNOSIS — O020 Blighted ovum and nonhydatidiform mole: Secondary | ICD-10-CM

## 2010-10-20 LAB — HCG, QUANTITATIVE, PREGNANCY: hCG, Beta Chain, Quant, S: 8210 m[IU]/mL — ABNORMAL HIGH (ref <5)

## 2010-10-20 LAB — AST: AST: 15 U/L (ref 0–37)

## 2010-10-20 LAB — CREATININE, SERUM
Creatinine, Ser: 0.47 mg/dL — ABNORMAL LOW (ref 0.50–1.10)
GFR calc non Af Amer: >60 mL/min (ref >60)

## 2010-10-20 LAB — CBC
Hemoglobin: 9.7 g/dL — ABNORMAL LOW (ref 12.0–15.0)
MCHC: 33.1 g/dL (ref 30.0–36.0)
WBC: 5 10*3/uL (ref 4.0–10.5)

## 2010-10-20 LAB — DIFFERENTIAL
Eosinophils Relative: 1 % (ref 0–5)
Monocytes Relative: 6 % (ref 3–12)
Neutrophils Relative %: 72 % (ref 43–77)

## 2010-10-20 MED ORDER — METHOTREXATE INJECTION FOR WOMEN'S HOSPITAL
50.0000 mg/m2 | Freq: Once | INTRAMUSCULAR | Status: AC
  Administered 2010-10-20: 95 mg via INTRAMUSCULAR
  Filled 2010-10-20: qty 1.9

## 2010-10-20 NOTE — ED Provider Notes (Signed)
[name] is a 30 y.o. female who returns for her weekly MTX injection after being dx with molar pregnancy. Having no problems but concerned that the hormone level only dropped slightly this week. She plans to discuss follow up with Dr. Ambrose Mantle. He has been calling her each week after her level is drawn. Dr. Ellyn Hack notified of results of Bhcg today.  [place], [place] 10/20/10 1239

## 2010-10-20 NOTE — Progress Notes (Signed)
No adverse reaction to MTX

## 2010-10-27 ENCOUNTER — Inpatient Hospital Stay (HOSPITAL_COMMUNITY)
Admission: AD | Admit: 2010-10-27 | Discharge: 2010-10-27 | Disposition: A | Discharge status: Home / Self Care | Payer: 59 | Source: Ambulatory Visit | Attending: Obstetrics and Gynecology | Admitting: Obstetrics and Gynecology

## 2010-10-27 DIAGNOSIS — O0289 Other abnormal products of conception: Secondary | ICD-10-CM | POA: Insufficient documentation

## 2010-10-27 LAB — HCG, QUANTITATIVE, PREGNANCY: hCG, Beta Chain, Quant, S: 5305 m[IU]/mL — ABNORMAL HIGH (ref <5)

## 2010-10-27 LAB — DIFFERENTIAL
Basophils Relative: 0 % (ref 0–1)
Eosinophils Absolute: 0.1 10*3/uL (ref 0.0–0.7)
Monocytes Relative: 8 % (ref 3–12)
Neutrophils Relative %: 74 % (ref 43–77)

## 2010-10-27 LAB — CBC
MCH: 28.7 pg (ref 26.0–34.0)
MCHC: 33 g/dL (ref 30.0–36.0)
Platelets: 244 10*3/uL (ref 150–400)

## 2010-10-27 MED ORDER — METHOTREXATE INJECTION FOR WOMEN'S HOSPITAL
50.0000 mg/m2 | Freq: Once | INTRAMUSCULAR | Status: AC
  Administered 2010-10-27: 95 mg via INTRAMUSCULAR
  Filled 2010-10-27: qty 1.9

## 2010-10-27 NOTE — ED Provider Notes (Signed)
History   Pt presents today for her weekly methotrexate injection secondary to molar preg. She states she is doing well and has no complaints other than some mild abd cramping.  Chief Complaint  Patient presents with  . Follow-up   HPI  OB History    Grav Para Term Preterm Abortions TAB SAB Ect Mult Living   4 2 2  0 1           Past Medical History  Diagnosis Date  . Cancer     hydatidiform mole    Past Surgical History  Procedure Date  . Cesarean section 2004, 2006  . Dilation and evacuation 08/12/2010    Procedure: DILATATION AND EVACUATION (D&E);  Surgeon: Bing Plume, MD;  Location: [facility];  Service: Gynecology;  Laterality: N/A;  . Dilation and curettage of uterus     No family history on file.  History  Substance Use Topics  . Smoking status: Never Smoker   . Smokeless tobacco: Not on file  . Alcohol Use: Yes     rarely    Allergies: No Known Allergies  Prescriptions prior to admission  Medication Sig Dispense Refill  . dimenhyDRINATE (DRAMAMINE) 50 MG tablet Take 50 mg by mouth every 6 (six) hours as needed. For nausea.      Marland Kitchen ibuprofen (ADVIL,MOTRIN) 200 MG tablet Take 400 mg by mouth daily as needed. For pain.       . methylergonovine (METHERGINE) 0.2 MG tablet Take 1 tablet (0.2 mg total) by mouth 3 (three) times daily as needed.  9 tablet  0    Review of Systems  Constitutional: Negative for fever.  Cardiovascular: Negative for chest pain.  Gastrointestinal: Negative for nausea, vomiting, abdominal pain, diarrhea and constipation.  Genitourinary: Negative for dysuria, urgency, frequency and hematuria.  Neurological: Negative for dizziness and headaches.  Psychiatric/Behavioral: Negative for depression and suicidal ideas.   Physical Exam   Blood pressure 107/62, pulse 72, temperature 97 F (36.1 C), temperature source Oral, resp. rate 16, last menstrual period 05/31/2010.  Physical Exam  Constitutional: She is oriented to person, place, and  time. She appears well-developed and well-nourished. No distress.  HENT:  Head: Normocephalic and atraumatic.  Eyes: EOM are normal. Pupils are equal, round, and reactive to light.  GI: Soft. She exhibits no distension. There is no tenderness. There is no rebound and no guarding.  Neurological: She is alert and oriented to person, place, and time.  Skin: Skin is warm and dry. She is not diaphoretic.  Psychiatric: She has a normal mood and affect. Her behavior is normal. Judgment and thought content normal.    MAU Course  Procedures  Results for orders placed during the hospital encounter of 10/27/10 (from the past 24 hour(s))  CBC     Status: Abnormal   Collection Time   10/27/10  9:22 AM      Component Value Range   WBC 5.1  4.0 - 10.5 (K/uL)   RBC 3.21 (*) 3.87 - 5.11 (MIL/uL)   Hemoglobin 9.2 (*) 12.0 - 15.0 (g/dL)   HCT 04.5 (*) 40.9 - 46.0 (%)   MCV 86.9  78.0 - 100.0 (fL)   MCH 28.7  26.0 - 34.0 (pg)   MCHC 33.0  30.0 - 36.0 (g/dL)   RDW 81.1  91.4 - 78.2 (%)   Platelets 244  150 - 400 (K/uL)  DIFFERENTIAL     Status: Normal   Collection Time   10/27/10  9:22 AM  Component Value Range   Neutrophils Relative 74  43 - 77 (%)   Neutro Abs 3.8  1.7 - 7.7 (K/uL)   Lymphocytes Relative 17  12 - 46 (%)   Lymphs Abs 0.9  0.7 - 4.0 (K/uL)   Monocytes Relative 8  3 - 12 (%)   Monocytes Absolute 0.4  0.1 - 1.0 (K/uL)   Eosinophils Relative 1  0 - 5 (%)   Eosinophils Absolute 0.1  0.0 - 0.7 (K/uL)   Basophils Relative 0  0 - 1 (%)   Basophils Absolute 0.0  0.0 - 0.1 (K/uL)  COMPREHENSIVE METABOLIC PANEL     Status: Abnormal   Collection Time   10/27/10  9:22 AM      Component Value Range   Sodium 139  135 - 145 (mEq/L)   Potassium 3.7  3.5 - 5.1 (mEq/L)   Chloride 104  96 - 112 (mEq/L)   CO2 29  19 - 32 (mEq/L)   Glucose, Bld 79  70 - 99 (mg/dL)   BUN 9  6 - 23 (mg/dL)   Creatinine, Ser <1.61 (*) 0.50 - 1.10 (mg/dL)   Calcium 9.6  8.4 - 09.6 (mg/dL)   Total Protein 6.9   6.0 - 8.3 (g/dL)   Albumin 3.8  3.5 - 5.2 (g/dL)   AST 13  0 - 37 (U/L)   ALT 10  0 - 35 (U/L)   Alkaline Phosphatase 61  39 - 117 (U/L)   Total Bilirubin 0.3  0.3 - 1.2 (mg/dL)   GFR calc non Af Amer NOT CALCULATED  >60 (mL/min)   GFR calc Af Amer NOT CALCULATED  >60 (mL/min)  HCG, QUANTITATIVE, PREGNANCY     Status: Abnormal   Collection Time   10/27/10  9:22 AM      Component Value Range   hCG, Beta Chain, Quant, S 5305 (*) <5 (mIU/mL)   Discussed pt with Dr. Ambrose Mantle at length. Pt will receive methotrexate today and f/u in 1wk.  Assessment and Plan  Molar preg: discussed with pt at length. She will continue weekly methotrexate injections. Discussed diet, activity, risks, and precautions.  Clinton Gallant. Rice III, DrHSc, MPAS, PA-C  10/27/2010, 10:40 AM   Henrietta Hoover, PA 10/27/10 1048

## 2010-10-27 NOTE — Progress Notes (Signed)
Pt to MAU for BHCG. Pt states that last week after her Wednesday injection she had a lot of bleeding on Thursday and Friday but none now. No pain.

## 2010-10-28 LAB — COMPREHENSIVE METABOLIC PANEL
Albumin: 3.8 g/dL (ref 3.5–5.2)
Alkaline Phosphatase: 61 U/L (ref 39–117)
BUN: 9 mg/dL (ref 6–23)
Calcium: 9.6 mg/dL (ref 8.4–10.5)
Potassium: 3.7 mEq/L (ref 3.5–5.1)
Total Protein: 6.9 g/dL (ref 6.0–8.3)

## 2010-11-01 ENCOUNTER — Other Ambulatory Visit: Payer: Self-pay | Admitting: Obstetrics and Gynecology

## 2010-11-01 ENCOUNTER — Other Ambulatory Visit (HOSPITAL_COMMUNITY): Payer: Self-pay | Admitting: *Deleted

## 2010-11-01 DIAGNOSIS — O020 Blighted ovum and nonhydatidiform mole: Secondary | ICD-10-CM

## 2010-11-02 ENCOUNTER — Other Ambulatory Visit: Payer: Self-pay | Admitting: Obstetrics and Gynecology

## 2010-11-02 ENCOUNTER — Inpatient Hospital Stay (HOSPITAL_COMMUNITY)
Admission: AD | Admit: 2010-11-02 | Discharge: 2010-11-02 | Disposition: A | Discharge status: Home / Self Care | Payer: 59 | Source: Ambulatory Visit | Attending: Obstetrics and Gynecology | Admitting: Obstetrics and Gynecology

## 2010-11-02 DIAGNOSIS — O0289 Other abnormal products of conception: Secondary | ICD-10-CM | POA: Insufficient documentation

## 2010-11-02 DIAGNOSIS — D392 Neoplasm of uncertain behavior of placenta: Secondary | ICD-10-CM

## 2010-11-02 LAB — COMPREHENSIVE METABOLIC PANEL
ALT: 10 U/L (ref 0–35)
Alkaline Phosphatase: 64 U/L (ref 39–117)
BUN: 8 mg/dL (ref 6–23)
CO2: 29 mEq/L (ref 19–32)
Chloride: 101 mEq/L (ref 96–112)
GFR calc Af Amer: >90 mL/min (ref >90)
GFR calc non Af Amer: >90 mL/min (ref >90)
Glucose, Bld: 91 mg/dL (ref 70–99)
Potassium: 3.5 mEq/L (ref 3.5–5.1)
Total Bilirubin: 0.2 mg/dL — ABNORMAL LOW (ref 0.3–1.2)

## 2010-11-02 LAB — CBC
HCT: 27.4 % — ABNORMAL LOW (ref 36.0–46.0)
Hemoglobin: 9.1 g/dL — ABNORMAL LOW (ref 12.0–15.0)
MCHC: 33.2 g/dL (ref 30.0–36.0)
RBC: 3.21 MIL/uL — ABNORMAL LOW (ref 3.87–5.11)
WBC: 5.4 10*3/uL (ref 4.0–10.5)

## 2010-11-02 LAB — HCG, SERUM, QUALITATIVE: Preg, Serum: POSITIVE — AB

## 2010-11-02 MED ORDER — METHOTREXATE INJECTION FOR WOMEN'S HOSPITAL
50.0000 mg/m2 | Freq: Once | INTRAMUSCULAR | Status: AC
  Administered 2010-11-02: 95 mg via INTRAMUSCULAR
  Filled 2010-11-02: qty 1.9

## 2010-11-02 NOTE — ED Provider Notes (Signed)
History   Pt presents today for repeat B-quant and methotrexate injection for prior invasive molar preg. She states she is doing well and has no complaints at this time.  Chief Complaint  Patient presents with  . Follow-up   HPI  OB History    Grav Para Term Preterm Abortions TAB SAB Ect Mult Living   4 2 2  0 1           Past Medical History  Diagnosis Date  . Cancer     hydatidiform mole    Past Surgical History  Procedure Date  . Cesarean section 2004, 2006  . Dilation and evacuation 08/12/2010    Procedure: DILATATION AND EVACUATION (D&E);  Surgeon: Bing Plume, MD;  Location: [facility];  Service: Gynecology;  Laterality: N/A;  . Dilation and curettage of uterus     No family history on file.  History  Substance Use Topics  . Smoking status: Never Smoker   . Smokeless tobacco: Not on file  . Alcohol Use: Yes     rarely    Allergies: No Known Allergies  Prescriptions prior to admission  Medication Sig Dispense Refill  . Ascorbic Acid (VITAMIN C) 1000 MG tablet Take 1,000 mg by mouth daily.        . Calcium-Vitamin D-Vitamin K (CALCIUM + D + K PO) Take 1 tablet by mouth daily.        . Cyanocobalamin (VITAMIN B 12 PO) Take 1 tablet by mouth daily.        Marland Kitchen OVER THE COUNTER MEDICATION Take 1 tablet by mouth daily. Patient takes iron over the counter       . dimenhyDRINATE (DRAMAMINE) 50 MG tablet Take 50 mg by mouth every 6 (six) hours as needed. For nausea.      Marland Kitchen ibuprofen (ADVIL,MOTRIN) 200 MG tablet Take 400 mg by mouth daily as needed. For pain.       . methylergonovine (METHERGINE) 0.2 MG tablet Take 1 tablet (0.2 mg total) by mouth 3 (three) times daily as needed.  9 tablet  0    Review of Systems  Constitutional: Negative for fever.  Cardiovascular: Negative for chest pain and palpitations.  Gastrointestinal: Negative for nausea, vomiting, abdominal pain, diarrhea and constipation.  Genitourinary: Negative for dysuria, urgency, frequency and hematuria.    Neurological: Negative for dizziness and headaches.  Psychiatric/Behavioral: Negative for depression and suicidal ideas.   Physical Exam   Blood pressure 109/71, pulse 87, temperature 98.3 F (36.8 C), temperature source Oral, resp. rate 16, height 5' 6.5" (1.689 m), weight 166 lb (75.297 kg), last menstrual period 05/31/2010.  Physical Exam  Constitutional: She is oriented to person, place, and time. She appears well-developed and well-nourished. No distress.  HENT:  Head: Normocephalic and atraumatic.  Eyes: EOM are normal. Pupils are equal, round, and reactive to light.  GI: Soft. She exhibits no distension. There is no tenderness. There is no rebound and no guarding.  Neurological: She is alert and oriented to person, place, and time.  Skin: Skin is warm and dry. She is not diaphoretic.  Psychiatric: She has a normal mood and affect. Her behavior is normal. Judgment and thought content normal.    MAU Course  Procedures  Results for orders placed during the hospital encounter of 11/02/10 (from the past 24 hour(s))  CBC     Status: Abnormal   Collection Time   11/02/10  3:20 PM      Component Value Range   WBC  5.4  4.0 - 10.5 (K/uL)   RBC 3.21 (*) 3.87 - 5.11 (MIL/uL)   Hemoglobin 9.1 (*) 12.0 - 15.0 (g/dL)   HCT 40.9 (*) 81.1 - 46.0 (%)   MCV 85.4  78.0 - 100.0 (fL)   MCH 28.3  26.0 - 34.0 (pg)   MCHC 33.2  30.0 - 36.0 (g/dL)   RDW 91.4  78.2 - 95.6 (%)   Platelets 272  150 - 400 (K/uL)  COMPREHENSIVE METABOLIC PANEL     Status: Abnormal   Collection Time   11/02/10  3:20 PM      Component Value Range   Sodium 135  135 - 145 (mEq/L)   Potassium 3.5  3.5 - 5.1 (mEq/L)   Chloride 101  96 - 112 (mEq/L)   CO2 29  19 - 32 (mEq/L)   Glucose, Bld 91  70 - 99 (mg/dL)   BUN 8  6 - 23 (mg/dL)   Creatinine, Ser 2.13  0.50 - 1.10 (mg/dL)   Calcium 9.6  8.4 - 08.6 (mg/dL)   Total Protein 7.1  6.0 - 8.3 (g/dL)   Albumin 3.9  3.5 - 5.2 (g/dL)   AST 16  0 - 37 (U/L)   ALT 10  0  - 35 (U/L)   Alkaline Phosphatase 64  39 - 117 (U/L)   Total Bilirubin 0.2 (*) 0.3 - 1.2 (mg/dL)   GFR calc non Af Amer >90  >90 (mL/min)   GFR calc Af Amer >90  >90 (mL/min)  HCG, SERUM, QUALITATIVE     Status: Abnormal   Collection Time   11/02/10  3:20 PM      Component Value Range   Preg, Serum POSITIVE (*) NEGATIVE   HCG, QUANTITATIVE, PREGNANCY     Status: Abnormal   Collection Time   11/02/10  3:49 PM      Component Value Range   hCG, Beta Chain, Quant, S 4055 (*) <5 (mIU/mL)    Pt discussed with Dr. Ambrose Mantle. He wishes pt to have methotrexate and f/u next week for repeat injection and blood work.   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 Discussed diet, activity, risks, and precautions.  Clinton Gallant. Wilford Merryfield III, DrHSc, MPAS, PA-C  11/02/2010, 7:15 PM   Henrietta Hoover, PA 11/02/10 1920

## 2010-11-02 NOTE — Progress Notes (Signed)
Pt to MAU for BHCG prior to MTX injections. Pt states she is having a little spotting and no pain.

## 2010-11-02 NOTE — ED Notes (Signed)
MTX dose verified with Sue Lush, PharmD

## 2010-11-02 NOTE — Progress Notes (Signed)
Rice,PA in to speak with pt, follow up reviewed. Injection given. Instructions given.

## 2010-11-09 ENCOUNTER — Inpatient Hospital Stay (HOSPITAL_COMMUNITY)
Admission: AD | Admit: 2010-11-09 | Discharge: 2010-11-09 | Disposition: A | Discharge status: Home / Self Care | Payer: 59 | Source: Ambulatory Visit | Attending: Obstetrics and Gynecology | Admitting: Obstetrics and Gynecology

## 2010-11-09 DIAGNOSIS — O00109 Unspecified tubal pregnancy without intrauterine pregnancy: Secondary | ICD-10-CM | POA: Insufficient documentation

## 2010-11-09 LAB — COMPREHENSIVE METABOLIC PANEL
ALT: 12 U/L (ref 0–35)
AST: 16 U/L (ref 0–37)
Calcium: 9.4 mg/dL (ref 8.4–10.5)
Creatinine, Ser: 0.54 mg/dL (ref 0.50–1.10)
GFR calc Af Amer: >90 mL/min (ref >90)
Sodium: 138 mEq/L (ref 135–145)
Total Protein: 7.1 g/dL (ref 6.0–8.3)

## 2010-11-09 LAB — CBC
MCH: 27.6 pg (ref 26.0–34.0)
MCHC: 32.1 g/dL (ref 30.0–36.0)
MCV: 85.9 fL (ref 78.0–100.0)
Platelets: 230 10*3/uL (ref 150–400)

## 2010-11-09 MED ORDER — METHOTREXATE INJECTION FOR WOMEN'S HOSPITAL
50.0000 mg/m2 | Freq: Once | INTRAMUSCULAR | Status: AC
  Administered 2010-11-09: 95 mg via INTRAMUSCULAR
  Filled 2010-11-09: qty 1.9

## 2010-11-09 NOTE — ED Notes (Signed)
Pt plans to work on [employer] in [employer] after blood work is drawn while waiting for injection.  Pt gave cell #.

## 2010-11-09 NOTE — Progress Notes (Signed)
Wkly methotrexate injection.  No complaints

## 2010-11-16 ENCOUNTER — Inpatient Hospital Stay (HOSPITAL_COMMUNITY)
Admission: AD | Admit: 2010-11-16 | Discharge: 2010-11-16 | Disposition: A | Discharge status: Home / Self Care | Payer: 59 | Source: Ambulatory Visit | Attending: Obstetrics and Gynecology | Admitting: Obstetrics and Gynecology

## 2010-11-16 DIAGNOSIS — O00109 Unspecified tubal pregnancy without intrauterine pregnancy: Secondary | ICD-10-CM | POA: Insufficient documentation

## 2010-11-16 LAB — COMPREHENSIVE METABOLIC PANEL
BUN: 8 mg/dL (ref 6–23)
CO2: 27 mEq/L (ref 19–32)
Calcium: 9.6 mg/dL (ref 8.4–10.5)
Chloride: 102 mEq/L (ref 96–112)
Creatinine, Ser: 0.58 mg/dL (ref 0.50–1.10)
GFR calc Af Amer: >90 mL/min (ref >90)
GFR calc non Af Amer: >90 mL/min (ref >90)
Glucose, Bld: 94 mg/dL (ref 70–99)
Total Bilirubin: 0.3 mg/dL (ref 0.3–1.2)

## 2010-11-16 LAB — CBC
HCT: 28.4 % — ABNORMAL LOW (ref 36.0–46.0)
Hemoglobin: 9.3 g/dL — ABNORMAL LOW (ref 12.0–15.0)
MCH: 27.4 pg (ref 26.0–34.0)
MCV: 83.8 fL (ref 78.0–100.0)
Platelets: 206 10*3/uL (ref 150–400)
RBC: 3.39 MIL/uL — ABNORMAL LOW (ref 3.87–5.11)
WBC: 5.4 10*3/uL (ref 4.0–10.5)

## 2010-11-16 NOTE — ED Notes (Signed)
Dr. Ambrose Mantle notified of pt's bhcg level, pt to keep scheduled appt at specialist's office for tomorrow.

## 2010-11-16 NOTE — Progress Notes (Signed)
Pt here for f/u bhcg, cbc, and cmet. Denies pain, has had minimal spotting only. Getting weekly MTX injections.

## 2010-11-17 ENCOUNTER — Other Ambulatory Visit: Payer: Self-pay | Admitting: Oncology

## 2010-11-17 ENCOUNTER — Ambulatory Visit: Payer: 59 | Attending: Gynecologic Oncology | Admitting: Gynecologic Oncology

## 2010-11-17 ENCOUNTER — Encounter (HOSPITAL_BASED_OUTPATIENT_CLINIC_OR_DEPARTMENT_OTHER): Payer: 59 | Admitting: Oncology

## 2010-11-17 DIAGNOSIS — D500 Iron deficiency anemia secondary to blood loss (chronic): Secondary | ICD-10-CM

## 2010-11-17 DIAGNOSIS — D392 Neoplasm of uncertain behavior of placenta: Secondary | ICD-10-CM

## 2010-11-17 DIAGNOSIS — O019 Hydatidiform mole, unspecified: Secondary | ICD-10-CM | POA: Insufficient documentation

## 2010-11-17 LAB — CBC WITH DIFFERENTIAL/PLATELET
BASO%: 0.8 % (ref 0.0–2.0)
Eosinophils Absolute: 0.1 10*3/uL (ref 0.0–0.5)
LYMPH%: 17.5 % (ref 14.0–49.7)
MCHC: 32.8 g/dL (ref 31.5–36.0)
MONO#: 0.6 10*3/uL (ref 0.1–0.9)
MONO%: 9.4 % (ref 0.0–14.0)
NEUT#: 4.2 10*3/uL (ref 1.5–6.5)
Platelets: 227 10*3/uL (ref 145–400)
RBC: 3.54 10*6/uL — ABNORMAL LOW (ref 3.70–5.45)
RDW: 14.8 % — ABNORMAL HIGH (ref 11.2–14.5)
WBC: 5.9 10*3/uL (ref 3.9–10.3)

## 2010-11-17 LAB — COMPREHENSIVE METABOLIC PANEL
ALT: 17 U/L (ref 0–35)
Albumin: 4.2 g/dL (ref 3.5–5.2)
Alkaline Phosphatase: 68 U/L (ref 39–117)
CO2: 26 mEq/L (ref 19–32)
Glucose, Bld: 96 mg/dL (ref 70–99)
Potassium: 3.8 mEq/L (ref 3.5–5.3)
Sodium: 137 mEq/L (ref 135–145)
Total Bilirubin: 0.3 mg/dL (ref 0.3–1.2)
Total Protein: 7.7 g/dL (ref 6.0–8.3)

## 2010-11-19 NOTE — Consult Note (Signed)
[name], [name]              ACCOUNT NO.:  [id number]  MEDICAL RECORD NO.:  [id number]  LOCATION:  GYN                          FACILITY:  [facility]  PHYSICIAN:  Shalene Gallen A. Duard Brady, MD    DATE OF BIRTH:  May 26, 1980  DATE OF CONSULTATION:  11/17/2010 DATE OF DISCHARGE:                                CONSULTATION   REFERRING PHYSICIAN:  Malachi Pro. Ambrose Mantle, M.D.  HISTORY OF PRESENT ILLNESS:  The patient is seen today in consultation at the request of Dr. Ambrose Mantle.  [name] is a 30 year old, gravida 3, para 2.  She has an 79-year-old son and a 54-year-old daughter who has been managed by Dr. Ambrose Mantle for persistent gestational trophoblastic disease.  She was admitted after removal of molar pregnancy.  Her last menstrual cycle was May 31, 2010.  Beta hCG on June 29th was 244,553. Ultrasound was performed that showed a molar pregnancy enlarging the uterus up to the level of the umbilicus.  She has had some bleeding that began approximately in the middle of June and in July had some small clots.  She underwent a D and C on August 12, 2010 that revealed a complete hydatidiform mole.  Her hCGs were appropriately followed by Dr. Ambrose Mantle.  As stated, at the time of her D and C, her hCG had actually risen to 409,811.  She had a repeat CT scan on August 15th and repeat hCG was  25,683 and she received her first injection of methotrexate at 50 mg/sq m (95 mg). August 22nd hCG 25658/MTX 95 mg.  On August 29th, hCG was down to 14,337. She received MTX 95mg  on September 5th, September 12th, September 19th, and September 26th with appropriate decline in her hCGs.  Her hCGs were decreasing in the range of 44%, 20%, 20%, 8.7% and 35%.  However, on October 2nd, her hCG was 4055, which represented 23% drop from prior, but repeat on October 9th, her hCG again began to plateau and was 3631, which represented a 10.8% drop from prior.  On October 16th, her hCG had actually gone up to 3870 and she was  subsequently referred to [place].  The patient did have appropriate imaging at the time of her diagnosis including head CT on August 13th that was negative.  CT scan of the chest, abdomen, and pelvis similarly showed no evidence of metastatic disease.  Within the uterus, however, she did have a 6.3 x 5.6 x 5.5 cm endometrial mass consistent with her molar pregnancy.  Her WHO score was 7 at the time of her diagnosis based on hCG level and size of the uterine mass.  She comes in today for evaluation.  She is overall doing fairly well.  She has had some bleeding about 5-6 times, has felt weak as a result of bleeding, but that has improved significantly.  PAST MEDICAL HISTORY:  None.  PAST SURGICAL HISTORY:  C-sections x2.  ALLERGIES:  None.  MEDICATIONS:  Iron, B12, vitamin C, calcium with vitamin D.  SOCIAL HISTORY:  She denies use of tobacco.  She drinks alcohol occasionally.  She is married.  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  FAMILY  HISTORY:  Maternal grandfather with diabetes with complications as a result of that.  Paternal grandmother with diabetes and coronary artery disease.  GYN HISTORY:  Her Paps have been normal.  She had one abnormal Pap smear prior to the birth of her son, who is 25 years old.  She had a negative biopsy and her Pap smear has been normal since that time.  PHYSICAL EXAMINATION:  VITAL SIGNS:  Weight 163 pounds, height 5 feet 6 inches, blood pressure 90/62, pulse 60, respirations 14, temperature 98 GENERAL:  Well-nourished well-developed female in no acute distress. NECK:  Supple.  There is no lymphadenopathy, no thyromegaly. LUNGS:  Clear to auscultation bilaterally. CARDIOVASCULAR:  Regular rate and rhythm. ABDOMEN:  Soft, nontender, nondistended. There are no palpable masses or hepatosplenomegaly.  Groins are negative for adenopathy. EXTREMITIES:  There is no edema. PELVIC:  External genitalia is within normal limits.  The  cervix is without lesions.  Bimanual examination reveals the uterus to be slightly globular approximately 6-week size.  There is no adnexal mass.  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  I did discuss with her data regarding hysterectomy that it may shorten the number of cycles of chemotherapy required as long as the duration course; however, she and her husband have not completely ruled out the possibility of wanting to have another child and she would like to receive chemotherapy with a fertility- sparing agent.  We discussed a single agent actimomycin D at 1.25 mg/sq m every 2 weeks as a bolus and she is amenable to this.  I have spoken to Dr. Jama Flavors and she will be seeing the patient today for consideration of chemotherapy.  The patient understands the need for contraception to continue as well as surveillance for the next year. Her questions were elicited and answered to her satisfaction.  She has my business card and will contact me if she has any concerns.     [name] A. Duard Brady, MD     PAG/MEDQ  D:  11/18/2010  T:  11/18/2010  Job:  [id number]  cc:   Malachi Pro. Ambrose Mantle, M.D. Fax: [phone]  Telford Nab, R.N. [address] [place], [place] [address]  Reece Packer, M.D. Fax: [phone]  Electronically Signed by Cleda Mccreedy MD on 11/19/2010 11:56:13 AM

## 2010-11-22 ENCOUNTER — Encounter: Payer: 59 | Admitting: Oncology

## 2010-11-22 DIAGNOSIS — Z5111 Encounter for antineoplastic chemotherapy: Secondary | ICD-10-CM

## 2010-11-28 ENCOUNTER — Other Ambulatory Visit: Payer: Self-pay | Admitting: Oncology

## 2010-11-28 ENCOUNTER — Encounter: Payer: Self-pay | Admitting: Oncology

## 2010-11-28 DIAGNOSIS — D392 Neoplasm of uncertain behavior of placenta: Secondary | ICD-10-CM

## 2010-11-28 HISTORY — DX: Neoplasm of uncertain behavior of placenta: D39.2

## 2010-11-29 ENCOUNTER — Other Ambulatory Visit: Payer: Self-pay | Admitting: Oncology

## 2010-11-29 ENCOUNTER — Encounter (HOSPITAL_BASED_OUTPATIENT_CLINIC_OR_DEPARTMENT_OTHER): Payer: 59 | Admitting: Oncology

## 2010-11-29 DIAGNOSIS — D509 Iron deficiency anemia, unspecified: Secondary | ICD-10-CM

## 2010-11-29 DIAGNOSIS — D392 Neoplasm of uncertain behavior of placenta: Secondary | ICD-10-CM

## 2010-11-29 LAB — CBC WITH DIFFERENTIAL/PLATELET
BASO%: 1.5 % (ref 0.0–2.0)
EOS%: 1.9 % (ref 0.0–7.0)
LYMPH%: 20.6 % (ref 14.0–49.7)
MCHC: 33.7 g/dL (ref 31.5–36.0)
MCV: 83.5 fL (ref 79.5–101.0)
MONO%: 4.6 % (ref 0.0–14.0)
Platelets: 181 10*3/uL (ref 145–400)
RBC: 3.41 10*6/uL — ABNORMAL LOW (ref 3.70–5.45)
RDW: 17.9 % — ABNORMAL HIGH (ref 11.2–14.5)

## 2010-11-29 LAB — COMPREHENSIVE METABOLIC PANEL
ALT: 13 U/L (ref 0–35)
AST: 19 U/L (ref 0–37)
Albumin: 4.2 g/dL (ref 3.5–5.2)
Alkaline Phosphatase: 49 U/L (ref 39–117)
Potassium: 3.8 mEq/L (ref 3.5–5.3)
Sodium: 140 mEq/L (ref 135–145)
Total Bilirubin: 0.2 mg/dL — ABNORMAL LOW (ref 0.3–1.2)
Total Protein: 6.6 g/dL (ref 6.0–8.3)

## 2010-11-29 LAB — FERRITIN: Ferritin: 23 ng/mL (ref 10–291)

## 2010-11-29 LAB — IRON AND TIBC: UIBC: 356 ug/dL (ref 125–400)

## 2010-11-29 LAB — HCG, QUANTITATIVE, PREGNANCY: hCG, Beta Chain, Quant, S: 11426.6 m[IU]/mL

## 2010-11-30 ENCOUNTER — Other Ambulatory Visit: Payer: Self-pay | Admitting: Oncology

## 2010-11-30 DIAGNOSIS — D392 Neoplasm of uncertain behavior of placenta: Secondary | ICD-10-CM

## 2010-12-03 ENCOUNTER — Ambulatory Visit (HOSPITAL_BASED_OUTPATIENT_CLINIC_OR_DEPARTMENT_OTHER): Payer: 59 | Admitting: Oncology

## 2010-12-03 ENCOUNTER — Other Ambulatory Visit: Payer: Self-pay | Admitting: Oncology

## 2010-12-03 ENCOUNTER — Ambulatory Visit (HOSPITAL_COMMUNITY)
Admission: RE | Admit: 2010-12-03 | Discharge: 2010-12-03 | Disposition: A | Discharge status: Home / Self Care | Payer: 59 | Source: Ambulatory Visit | Attending: Oncology | Admitting: Oncology

## 2010-12-03 VITALS — Ht 66.0 in | Wt 162.0 lb

## 2010-12-03 DIAGNOSIS — O0289 Other abnormal products of conception: Secondary | ICD-10-CM | POA: Insufficient documentation

## 2010-12-03 DIAGNOSIS — D392 Neoplasm of uncertain behavior of placenta: Secondary | ICD-10-CM

## 2010-12-03 DIAGNOSIS — O019 Hydatidiform mole, unspecified: Secondary | ICD-10-CM

## 2010-12-03 DIAGNOSIS — K7689 Other specified diseases of liver: Secondary | ICD-10-CM | POA: Insufficient documentation

## 2010-12-03 LAB — HCG, QUANTITATIVE, PREGNANCY: hCG, Beta Chain, Quant, S: 3845 m[IU]/mL

## 2010-12-03 MED ORDER — DEXAMETHASONE SODIUM PHOSPHATE 10 MG/ML IJ SOLN: 10.0000 mg | Freq: Once | INTRAMUSCULAR | Status: DC

## 2010-12-03 MED ORDER — IOHEXOL 300 MG/ML  SOLN
100.0000 mL | Freq: Once | INTRAMUSCULAR | Status: AC | PRN
  Administered 2010-12-03: 100 mL via INTRAVENOUS

## 2010-12-03 MED ORDER — SODIUM CHLORIDE 0.9 % IV SOLN: INTRAVENOUS | Status: DC

## 2010-12-03 MED ORDER — ONDANSETRON 16 MG/50ML IVPB (CHCC): 16.0000 mg | Freq: Once | INTRAVENOUS | Status: DC

## 2010-12-03 MED ORDER — SODIUM CHLORIDE 0.9 % IV SOLN
1.2500 mg/m2 | Freq: Once | INTRAVENOUS | Status: DC
  Filled 2010-12-03: qty 4.6

## 2010-12-03 MED ORDER — DIPHENHYDRAMINE HCL 25 MG PO TABS
50.0000 mg | ORAL_TABLET | Freq: Once | ORAL | Status: DC
  Filled 2010-12-03: qty 2

## 2010-12-06 ENCOUNTER — Other Ambulatory Visit: Payer: Self-pay | Admitting: Oncology

## 2010-12-06 ENCOUNTER — Other Ambulatory Visit (HOSPITAL_BASED_OUTPATIENT_CLINIC_OR_DEPARTMENT_OTHER): Payer: 59 | Admitting: Lab

## 2010-12-06 ENCOUNTER — Ambulatory Visit (HOSPITAL_BASED_OUTPATIENT_CLINIC_OR_DEPARTMENT_OTHER): Payer: 59

## 2010-12-06 VITALS — BP 95/60 | HR 65 | Temp 97.4°F

## 2010-12-06 DIAGNOSIS — D392 Neoplasm of uncertain behavior of placenta: Secondary | ICD-10-CM

## 2010-12-06 DIAGNOSIS — Z5111 Encounter for antineoplastic chemotherapy: Secondary | ICD-10-CM

## 2010-12-06 LAB — CBC WITH DIFFERENTIAL/PLATELET
Basophils Absolute: 0.1 10*3/uL (ref 0.0–0.1)
EOS%: 1.7 % (ref 0.0–7.0)
Eosinophils Absolute: 0.1 10*3/uL (ref 0.0–0.5)
HGB: 9.8 g/dL — ABNORMAL LOW (ref 11.6–15.9)
NEUT#: 4.1 10*3/uL (ref 1.5–6.5)
RDW: 16.5 % — ABNORMAL HIGH (ref 11.2–14.5)
WBC: 6 10*3/uL (ref 3.9–10.3)
lymph#: 1.3 10*3/uL (ref 0.9–3.3)

## 2010-12-06 LAB — HCG, QUANTITATIVE, PREGNANCY: hCG, Beta Chain, Quant, S: 6844.5 m[IU]/mL

## 2010-12-06 MED ORDER — DIPHENHYDRAMINE HCL 25 MG PO TABS
25.0000 mg | ORAL_TABLET | Freq: Once | ORAL | Status: AC
  Administered 2010-12-06: 25 mg via ORAL
  Filled 2010-12-06: qty 1

## 2010-12-06 MED ORDER — ONDANSETRON 16 MG/50ML IVPB (CHCC)
16.0000 mg | Freq: Once | INTRAVENOUS | Status: AC
  Administered 2010-12-06: 16 mg via INTRAVENOUS

## 2010-12-06 MED ORDER — DEXAMETHASONE SODIUM PHOSPHATE 10 MG/ML IJ SOLN
10.0000 mg | Freq: Once | INTRAMUSCULAR | Status: AC
  Administered 2010-12-06: 10 mg via INTRAVENOUS

## 2010-12-06 MED ORDER — SODIUM CHLORIDE 0.9 % IV SOLN: INTRAVENOUS | Status: DC

## 2010-12-06 MED ORDER — SODIUM CHLORIDE 0.9 % IV SOLN
1.2500 mg/m2 | Freq: Once | INTRAVENOUS | Status: AC
  Administered 2010-12-06: 2300 ug via INTRAVENOUS
  Filled 2010-12-06: qty 4.6

## 2010-12-07 ENCOUNTER — Encounter: Payer: Self-pay | Admitting: Certified Registered Nurse Anesthetist

## 2010-12-09 ENCOUNTER — Telehealth: Payer: Self-pay | Admitting: *Deleted

## 2010-12-10 ENCOUNTER — Telehealth: Payer: Self-pay

## 2010-12-10 ENCOUNTER — Other Ambulatory Visit: Payer: Self-pay | Admitting: Oncology

## 2010-12-10 DIAGNOSIS — D392 Neoplasm of uncertain behavior of placenta: Secondary | ICD-10-CM

## 2010-12-10 NOTE — Telephone Encounter (Signed)
SPOKE WITH MS. Droz AND TOLD HER THAT SHE NEEDED LAB WORK EARLY 12-13-10 AM FOR BHCG LEVEL TO GO STAT.  APPT. AT 0815.  DR. Darrold Span SAID THAT SHE DOES NOT HAVE TO KEEP DOCTOR VISIT 12-13-10 SINCE SHE IS TO HAVE SURGERY 12-17-10 AND SAW DR. Duard Brady TODAY.  PT. VERBALIZED UNDERSTANDING.

## 2010-12-13 ENCOUNTER — Telehealth: Payer: Self-pay

## 2010-12-13 ENCOUNTER — Ambulatory Visit: Payer: 59 | Admitting: Oncology

## 2010-12-13 ENCOUNTER — Other Ambulatory Visit: Payer: Self-pay | Admitting: Oncology

## 2010-12-13 ENCOUNTER — Other Ambulatory Visit (HOSPITAL_BASED_OUTPATIENT_CLINIC_OR_DEPARTMENT_OTHER): Payer: 59 | Admitting: Lab

## 2010-12-13 DIAGNOSIS — D392 Neoplasm of uncertain behavior of placenta: Secondary | ICD-10-CM

## 2010-12-13 LAB — HCG, QUANTITATIVE, PREGNANCY: hCG, Beta Chain, Quant, S: 5919 m[IU]/mL

## 2010-12-13 LAB — COMPREHENSIVE METABOLIC PANEL
ALT: 11 U/L (ref 0–35)
AST: 16 U/L (ref 0–37)
Albumin: 4.2 g/dL (ref 3.5–5.2)
BUN: 7 mg/dL (ref 6–23)
Calcium: 9.3 mg/dL (ref 8.4–10.5)
Chloride: 107 mEq/L (ref 96–112)
Potassium: 4.1 mEq/L (ref 3.5–5.3)
Sodium: 137 mEq/L (ref 135–145)
Total Protein: 6.5 g/dL (ref 6.0–8.3)

## 2010-12-13 LAB — CBC WITH DIFFERENTIAL/PLATELET
Basophils Absolute: 0.1 10*3/uL (ref 0.0–0.1)
EOS%: 1.1 % (ref 0.0–7.0)
HGB: 9.8 g/dL — ABNORMAL LOW (ref 11.6–15.9)
MCH: 28 pg (ref 25.1–34.0)
NEUT#: 2.9 10*3/uL (ref 1.5–6.5)
RDW: 18.1 % — ABNORMAL HIGH (ref 11.2–14.5)
lymph#: 1.2 10*3/uL (ref 0.9–3.3)

## 2010-12-13 NOTE — Telephone Encounter (Signed)
CBC AND BHCG  FAXED TO DR. Duard Brady PER BEVERLY MISENHEIMER.  BEVERLY ALSO GAVE A COPY TO NANCY WILKINSON IN GYN ONC CLINIC.

## 2010-12-13 NOTE — Telephone Encounter (Signed)
GAVE MS. Lakeman THE BHCG VALUE FOR TODAY=5919.0 WHICH IS LOWER THAN 12-06-10.  HER HGB. =9.8 WHICH HAS BEEN CONSTANT.

## 2010-12-15 NOTE — Progress Notes (Signed)
Lab only no MD visit this day

## 2010-12-16 ENCOUNTER — Telehealth: Payer: Self-pay | Admitting: Oncology

## 2010-12-16 ENCOUNTER — Other Ambulatory Visit (HOSPITAL_COMMUNITY): Payer: Self-pay | Admitting: Oncology

## 2010-12-17 HISTORY — PX: LAPAROSCOPIC TOTAL HYSTERECTOMY: SUR800

## 2010-12-17 HISTORY — PX: ABDOMINAL HYSTERECTOMY: SHX81

## 2010-12-20 ENCOUNTER — Ambulatory Visit: Payer: 59

## 2010-12-20 ENCOUNTER — Other Ambulatory Visit: Payer: 59 | Admitting: Lab

## 2010-12-29 ENCOUNTER — Other Ambulatory Visit: Payer: Self-pay | Admitting: Oncology

## 2010-12-29 DIAGNOSIS — D392 Neoplasm of uncertain behavior of placenta: Secondary | ICD-10-CM

## 2010-12-30 ENCOUNTER — Other Ambulatory Visit: Payer: Self-pay | Admitting: Oncology

## 2010-12-30 ENCOUNTER — Other Ambulatory Visit (HOSPITAL_BASED_OUTPATIENT_CLINIC_OR_DEPARTMENT_OTHER): Payer: 59 | Admitting: Lab

## 2010-12-30 DIAGNOSIS — D392 Neoplasm of uncertain behavior of placenta: Secondary | ICD-10-CM

## 2010-12-30 LAB — CBC WITH DIFFERENTIAL/PLATELET
EOS%: 1.7 % (ref 0.0–7.0)
LYMPH%: 15.7 % (ref 14.0–49.7)
MCH: 27.6 pg (ref 25.1–34.0)
MCHC: 33 g/dL (ref 31.5–36.0)
MCV: 83.4 fL (ref 79.5–101.0)
MONO%: 6.1 % (ref 0.0–14.0)
NEUT#: 4.5 10*3/uL (ref 1.5–6.5)
RBC: 4.03 10*6/uL (ref 3.70–5.45)
RDW: 18.2 % — ABNORMAL HIGH (ref 11.2–14.5)

## 2010-12-31 ENCOUNTER — Telehealth: Payer: Self-pay

## 2010-12-31 ENCOUNTER — Other Ambulatory Visit: Payer: Self-pay | Admitting: Oncology

## 2010-12-31 ENCOUNTER — Telehealth: Payer: Self-pay | Admitting: Oncology

## 2010-12-31 DIAGNOSIS — D392 Neoplasm of uncertain behavior of placenta: Secondary | ICD-10-CM

## 2010-12-31 NOTE — Telephone Encounter (Signed)
lmonvm for pt re wkly lb appts for 12/6, 12/13, 12/20, and 12/27 all @ 9 am. Per louise no tx needed.

## 2010-12-31 NOTE — Telephone Encounter (Signed)
LM IN PT.'S CELL PHONE  LETTING HER KNOW THAT HER LABS WERE GOOD FROM 12-30-10 PER DR. Darrold Span.  HER HGB WAS STILL A LITTLE LOW BUT MUCH BETTER AT 11.1. A COPY OF HER LABS WERE FAXED TO DR. TOM HENLEY.  FAX [phone]. [phone].

## 2011-01-06 ENCOUNTER — Other Ambulatory Visit (HOSPITAL_BASED_OUTPATIENT_CLINIC_OR_DEPARTMENT_OTHER): Payer: 59 | Admitting: Lab

## 2011-01-06 DIAGNOSIS — D392 Neoplasm of uncertain behavior of placenta: Secondary | ICD-10-CM

## 2011-01-06 LAB — HCG, QUANTITATIVE, PREGNANCY: hCG, Beta Chain, Quant, S: 20.1 m[IU]/mL

## 2011-01-07 ENCOUNTER — Telehealth: Payer: Self-pay

## 2011-01-07 NOTE — Telephone Encounter (Signed)
SPOKE WITH MS. Zinn AND TOLD HER THAT THE BHCD LEVEL WAS 20 YESTERDAY.  IT IS  SL. UP FROM THE PREVIOUS WEEK.  NANCY WILL DISCUSS WITH DR. GHERIG.  NANCY SAID NOT TO FRET ABOUT LEVEL. SHE WILL KEEP APT. FOR LAB 01-13-11 AS SCHEDULED.

## 2011-01-13 ENCOUNTER — Other Ambulatory Visit: Payer: 59 | Admitting: Lab

## 2011-01-13 ENCOUNTER — Ambulatory Visit (HOSPITAL_BASED_OUTPATIENT_CLINIC_OR_DEPARTMENT_OTHER): Payer: 59

## 2011-01-13 DIAGNOSIS — D392 Neoplasm of uncertain behavior of placenta: Secondary | ICD-10-CM

## 2011-01-13 LAB — HCG, QUANTITATIVE, PREGNANCY: hCG, Beta Chain, Quant, S: 15.9 m[IU]/mL

## 2011-01-14 ENCOUNTER — Telehealth: Payer: Self-pay | Admitting: Oncology

## 2011-01-14 NOTE — Telephone Encounter (Signed)
Returned patient's phone call re results of BHCG from 01-13-11. Result lower at 15.9; she is feeling well and is scheduled for repeat BHCG 12-20 and 01-27-11.  I have LM for gyn onc that if they need to tell her anything additionally they should call her  [phone]; lab was cc'd to Dr.P.Gehrig and to N.Aundria Rud

## 2011-01-18 ENCOUNTER — Encounter: Payer: Self-pay | Admitting: Gynecologic Oncology

## 2011-01-19 ENCOUNTER — Encounter: Payer: Self-pay | Admitting: Gynecologic Oncology

## 2011-01-19 ENCOUNTER — Ambulatory Visit: Payer: 59 | Attending: Gynecologic Oncology | Admitting: Gynecologic Oncology

## 2011-01-19 VITALS — BP 110/60 | HR 74 | Temp 98.3°F | Resp 16 | Ht 67.0 in | Wt 171.6 lb

## 2011-01-19 DIAGNOSIS — O019 Hydatidiform mole, unspecified: Secondary | ICD-10-CM | POA: Insufficient documentation

## 2011-01-19 NOTE — Patient Instructions (Signed)
Return to clinic next Thursday for repeat hCG

## 2011-01-19 NOTE — Progress Notes (Signed)
Consult Note: Gyn-Onc  [name] 30 y.o. female  CC:  Chief Complaint  Patient presents with  . GTD    Follow up    HPI:HPI: 30 year old G3, P2 who was admitted to [facility] after removal of her molar pregnancy. Beta hCG on 6/29 was 244,000. Ultrasound was performed that showed a molar pregnancy and she had some bleeding. 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 HCGs were appropriately followed by Dr. Ambrose Mantle in [place]. At the time of her D&C, her hCG had actually risen to 603,000. She had a repeat CT scan on 8/15 and repeat hCG was 25,000 and she received her first injection of methotrexate. On 8/22, her hCG was 26,658. On 8/29, hCG was 14,000. She had methotrexate on 9/5, 9/12, 9/19 and 9/26 with appropriate decline in her hCGs. Her hCGs were decreasing in the range of 44%, 20%, 20%, 8.7% and 35%; however, on 10/2, her hCG was 4055, which again showed 23% drop from prior, but repeat on 10/9 hCG began to plateau and it is 3631 and actually gone up to 3870. She was referred to [facility] at [facility] with Dr. Duard Brady in October 2012. CT scan of the chest, abdomen and pelvis showed no evidence of metastatic disease. Within the uterus, she did have a 6 x 5 x 5 cm endometrial mass consistent with a molar pregnancy. Her WHO score was 7 at the time of her diagnosis being on hCG level. She was evaluated by Dr. Duard Brady and counseled regarding treatment options. She has been started on dactinomycin last week and has been dispositioned to hysterectomy. The patient currently is doing well with no complaints.   PE: Shows normal external female genitalia, normal urethra meatus. Upon insertion of speculum, there was some bleeding and clots noted in the vaginal vault, which was cleared. No active bleeding seen from the cervix. No vaginal or cervical masses seen. Bimanual examination reveals normal cervix with a uterus measuring about 8-10 cm, which is mobile. No adnexal masses or  tenderness to palpation.   PMH: none  PSH: CS x2  FH: none  Labs: as above: Recent HCG 12/17/2010 was 3489   Imaging: As above.   Procedure: Total robotic hysterectomy, bilateral salpingectomy 12/17/10.  Findings: 1. Normal tubes and ovaries bilaterally. 2. Globular uterus.   Pathology: Complete hydatidiform mole, size 4.5 x 4.2 x 3.5 cm, filling endometrial cavity and abuts myometrium, with focal trophoblasts extending superficially into myometrium. + LVSI. No serosal, cervix, lower uterine segment, or tubal involvement identified. Bilateral tubes / ovaries benign.   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 Dispositioned to continued dactinomycin with consideration of no chemo if her HCG drops appropriately.   HCGs has been followed consistently since her surgery. First and postoperatively was 18.9 it then rose slightly to 20.1. Most recently on December 13 it was 15.9.  Today's labs reveal an HCG of 11.0  She comes in today for her postoperative check.  She is doing well and very pleased with today's labs.  She is due to return to work on 01/29/11.   Interval History:   Review of Systems  Current Meds:  Outpatient Encounter Prescriptions as of 01/19/2011  Medication Sig Dispense Refill  . Ascorbic Acid (VITAMIN C) 1000 MG tablet Take 1,000 mg by mouth daily.        . Calcium-Vitamin  D-Vitamin K (CALCIUM + D + K PO) Take 1 tablet by mouth daily.        . Cyanocobalamin (VITAMIN B 12 PO) Take 1 tablet by mouth daily.        Marland Kitchen ibuprofen (ADVIL,MOTRIN) 200 MG tablet Take 400 mg by mouth daily as needed. For pain.       Marland Kitchen dimenhyDRINATE (DRAMAMINE) 50 MG tablet Take 50 mg by mouth every 6 (six) hours as needed. For nausea.      Marland Kitchen LORazepam (ATIVAN) 0.5 MG tablet Take 0.5 mg by mouth every 6 (six) hours as needed. MAY PUT UNDER TONGUE OR SWALLOW       .  methylergonovine (METHERGINE) 0.2 MG tablet Take 1 tablet (0.2 mg total) by mouth 3 (three) times daily as needed.  9 tablet  0  . ondansetron (ZOFRAN) 8 MG tablet Take 8 mg by mouth every 12 (twelve) hours as needed. MAY TAKE 1-2 TABS EVERY 12 HRS AS NEEDED       . OVER THE COUNTER MEDICATION Take 1 tablet by mouth daily. Patient takes iron over the counter         Allergy: No Known Allergies  Social Hx:   History   Social History  . Marital Status: Married    Spouse Name: N/A    Number of Children: N/A  . Years of Education: N/A   Occupational History  . Not on file.   Social History Main Topics  . Smoking status: Never Smoker   . Smokeless tobacco: Not on file  . Alcohol Use: Yes     rarely  . Drug Use: No  . Sexually Active: Yes    Birth Control/ Protection: None   Other Topics Concern  . Not on file   Social History Narrative  . No narrative on file    Past Surgical Hx:  Past Surgical History  Procedure Date  . Cesarean section 2004, 2006  . Dilation and evacuation 08/12/2010    Procedure: DILATATION AND EVACUATION (D&E);  Surgeon: Bing Plume, MD;  Location: [facility];  Service: Gynecology;  Laterality: N/A;  . Dilation and curettage of uterus   . Abdominal hysterectomy 12/17/2010    Past Medical Hx:  Past Medical History  Diagnosis Date  . Cancer     hydatidiform mole  . 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 Diabetes Other   . Diabetes Other   . Coronary artery disease Other     Vitals:  Blood pressure 110/60, pulse 74, temperature 98.3 F (36.8 C), temperature source Oral, resp. rate 16, height 5\' 7"  (1.702 m), weight 171 lb 9.6 oz (77.837 kg), last menstrual period 05/31/2010, unknown if currently breastfeeding.  Physical Exam: Well-nourished well-developed female in no acute distress.  Abdomen: Well-healed surgical incisions. Abdomen is soft nontender  nondistended there are no palpable masses or hepatomegaly.  Pelvic: Normal external female genitalia. The vaginal cuff is healing well. Suture line is intact. There is no granulation tissue. Bimanual examination reveals no nodularity.  Assessment/Plan: Very pleasant young lady with a stage I WHO score 5 GTN (1 point for pretreatment hCG of 3000, 2 points for tumor size of 5 cm, 2 points for failure of one single agent regimen). She was dispositioned to continued dactinomycin with consideration of no chemotherapy for hCG drops appropriately. To date has been the case in her hCG is dropped nicely. She'll return next  Thursday for repeat hCG and I will call her with the results. She'll be released to return to work on December 29.  Clarence Cogswell A., MD 01/19/2011, 3:30 PM

## 2011-01-20 ENCOUNTER — Other Ambulatory Visit: Payer: 59 | Admitting: Lab

## 2011-01-27 ENCOUNTER — Other Ambulatory Visit (HOSPITAL_BASED_OUTPATIENT_CLINIC_OR_DEPARTMENT_OTHER): Payer: 59 | Admitting: Lab

## 2011-01-27 DIAGNOSIS — D392 Neoplasm of uncertain behavior of placenta: Secondary | ICD-10-CM

## 2011-02-08 ENCOUNTER — Other Ambulatory Visit: Payer: Self-pay

## 2011-02-08 ENCOUNTER — Other Ambulatory Visit: Payer: Self-pay | Admitting: Oncology

## 2011-02-08 ENCOUNTER — Ambulatory Visit (HOSPITAL_BASED_OUTPATIENT_CLINIC_OR_DEPARTMENT_OTHER): Payer: 59

## 2011-02-08 DIAGNOSIS — D392 Neoplasm of uncertain behavior of placenta: Secondary | ICD-10-CM

## 2011-02-09 ENCOUNTER — Telehealth: Payer: Self-pay | Admitting: Gynecologic Oncology

## 2011-02-10 NOTE — Telephone Encounter (Signed)
See message above

## 2011-02-11 ENCOUNTER — Encounter: Payer: Self-pay | Admitting: Oncology

## 2011-02-11 ENCOUNTER — Telehealth: Payer: Self-pay | Admitting: Oncology

## 2011-02-11 ENCOUNTER — Other Ambulatory Visit: Payer: Self-pay | Admitting: Oncology

## 2011-02-11 DIAGNOSIS — D392 Neoplasm of uncertain behavior of placenta: Secondary | ICD-10-CM

## 2011-02-11 NOTE — Progress Notes (Signed)
Note to this MD from gyn onc clinic that patient needs to be scheduled for weekly BHCG until normal, then weekly x 1 month, then monthly x 1 year. Most recent BHCG was 7.9 (normal < 5) on Jan 8.  I have entered weekly orders beginning Jan 15 thru Feb 26; these dates may need to be adjusted if she normalizes quickly. I have requested all results be copied to Dr.Gehrig at [facility], to gyn onc clinic at [facility] and to Dr.[facility]. Note she will need labs/ appts out beyond Feb 26 when dates known. Request sent to schedulers for Jan 15 - Feb 26, 20113.

## 2011-02-11 NOTE — Telephone Encounter (Signed)
lmonvm adviisng the pt of her weekly lab appts starting on 02/15/2011

## 2011-02-15 ENCOUNTER — Other Ambulatory Visit: Payer: 59 | Admitting: Lab

## 2011-02-15 ENCOUNTER — Telehealth: Payer: Self-pay

## 2011-02-15 NOTE — Telephone Encounter (Signed)
SPOKE WITH [name] AND R/S HER LAB APPT. FROM TODAY TO TOMORROW AS SHE WAS NOT AWARE OF APPT. TODAY.  GAVE HER THE WEEKLY  LAB SCHEDULE THROUGH 03-29-11.  APPTS. WILL NEED TO BE MADE FROM THAT POINT FORWARD. SEE PROGRESS NOTE BY DR. Darrold Span DATED 02-11-11 FOR DETAILED LAB PLAN FOR PATIENT.

## 2011-02-16 ENCOUNTER — Other Ambulatory Visit: Payer: 59 | Admitting: Lab

## 2011-02-16 DIAGNOSIS — D392 Neoplasm of uncertain behavior of placenta: Secondary | ICD-10-CM

## 2011-02-16 LAB — HCG, QUANTITATIVE, PREGNANCY: hCG, Beta Chain, Quant, S: 6.4 m[IU]/mL

## 2011-02-17 ENCOUNTER — Telehealth: Payer: Self-pay | Admitting: *Deleted

## 2011-02-17 NOTE — Telephone Encounter (Signed)
Pt. Called for result of lab done yest.  This result was given to her.

## 2011-02-21 ENCOUNTER — Telehealth: Payer: Self-pay

## 2011-02-21 NOTE — Telephone Encounter (Signed)
NANCY WILKINSON RN WITH GYN ONC SAID THAT BEVERLY M. IN [facility] LAB HAS BHCG RESULTS BEING SENT TO THEIR OFFICE TO FOLLOW ON PATIENT.  THEY WILL NOTIFY PATIENT OF RESULTS.

## 2011-02-22 ENCOUNTER — Other Ambulatory Visit: Payer: 59 | Admitting: Lab

## 2011-02-22 DIAGNOSIS — D392 Neoplasm of uncertain behavior of placenta: Secondary | ICD-10-CM

## 2011-02-23 ENCOUNTER — Telehealth: Payer: Self-pay | Admitting: *Deleted

## 2011-02-23 NOTE — Telephone Encounter (Signed)
Pt and Dr Duard Brady notified of Hcg from 02/22/11.  Per Dr Duard Brady we will defer treatment decisions pending next week's Hcg.

## 2011-03-01 ENCOUNTER — Encounter: Payer: Self-pay | Admitting: Oncology

## 2011-03-01 ENCOUNTER — Other Ambulatory Visit: Payer: 59 | Admitting: Lab

## 2011-03-01 DIAGNOSIS — D392 Neoplasm of uncertain behavior of placenta: Secondary | ICD-10-CM

## 2011-03-01 LAB — HCG, QUANTITATIVE, PREGNANCY: hCG, Beta Chain, Quant, S: 6.6 m[IU]/mL

## 2011-03-01 NOTE — Progress Notes (Signed)
email from Dr.Gehrig 02-23-11 to N.Aundria Rud cc'd to me: she discussed rise in B HCG with Dr.Soper. Plan is to repeat B HCG as scheduled today, if rising to repeat scan and 5 day actinomycin D.

## 2011-03-08 ENCOUNTER — Other Ambulatory Visit (HOSPITAL_BASED_OUTPATIENT_CLINIC_OR_DEPARTMENT_OTHER): Payer: 59

## 2011-03-08 DIAGNOSIS — D392 Neoplasm of uncertain behavior of placenta: Secondary | ICD-10-CM

## 2011-03-08 LAB — HCG, QUANTITATIVE, PREGNANCY: hCG, Beta Chain, Quant, S: 9.9 m[IU]/mL

## 2011-03-10 ENCOUNTER — Telehealth: Payer: Self-pay | Admitting: *Deleted

## 2011-03-10 NOTE — Telephone Encounter (Signed)
Pt given Hcg results and Dr Denman George recommendation the she may wait for one more Hcg before restarting chemo or she may wish to proceed with chemo as soon as possible. She will consider this and call us tomorrow with her decision.

## 2011-03-11 ENCOUNTER — Telehealth: Payer: Self-pay

## 2011-03-11 ENCOUNTER — Telehealth: Payer: Self-pay | Admitting: Oncology

## 2011-03-11 ENCOUNTER — Other Ambulatory Visit (HOSPITAL_COMMUNITY): Payer: 59

## 2011-03-11 ENCOUNTER — Other Ambulatory Visit: Payer: Self-pay

## 2011-03-11 ENCOUNTER — Ambulatory Visit (HOSPITAL_BASED_OUTPATIENT_CLINIC_OR_DEPARTMENT_OTHER): Payer: 59 | Admitting: Lab

## 2011-03-11 ENCOUNTER — Other Ambulatory Visit: Payer: Self-pay | Admitting: Oncology

## 2011-03-11 DIAGNOSIS — D392 Neoplasm of uncertain behavior of placenta: Secondary | ICD-10-CM

## 2011-03-11 DIAGNOSIS — O019 Hydatidiform mole, unspecified: Secondary | ICD-10-CM

## 2011-03-11 LAB — CBC WITH DIFFERENTIAL/PLATELET
BASO%: 0.8 % (ref 0.0–2.0)
Eosinophils Absolute: 0.1 10*3/uL (ref 0.0–0.5)
HCT: 37.5 % (ref 34.8–46.6)
LYMPH%: 29.5 % (ref 14.0–49.7)
MONO#: 0.4 10*3/uL (ref 0.1–0.9)
NEUT#: 3.1 10*3/uL (ref 1.5–6.5)
NEUT%: 60.2 % (ref 38.4–76.8)
Platelets: 190 10*3/uL (ref 145–400)
RBC: 4.48 10*6/uL (ref 3.70–5.45)
WBC: 5.2 10*3/uL (ref 3.9–10.3)
lymph#: 1.5 10*3/uL (ref 0.9–3.3)

## 2011-03-11 LAB — COMPREHENSIVE METABOLIC PANEL
AST: 23 U/L (ref 0–37)
Albumin: 4.1 g/dL (ref 3.5–5.2)
BUN: 13 mg/dL (ref 6–23)
CO2: 27 mEq/L (ref 19–32)
Calcium: 10 mg/dL (ref 8.4–10.5)
Chloride: 105 mEq/L (ref 96–112)
Glucose, Bld: 88 mg/dL (ref 70–99)
Potassium: 3.7 mEq/L (ref 3.5–5.3)

## 2011-03-11 NOTE — Telephone Encounter (Signed)
Talked to pt, gave her appt for CT and lab, also informed pt to get a new calendar for next week appt.

## 2011-03-11 NOTE — Telephone Encounter (Signed)
SPOKE WITH [name] AND GAVE HER APPTS. FOR TREATMENT NEXT WEEK, CT SCAN 03-15-11.  SHE WILL P/U CONTRAST FOR CT WHEN HERE FOR LAB TODAY. TOLD HER THAT DR. Duard Brady WANTS THE HCG LEVEL DONE ON MONDAYS.  SHE WILL HAVE THIS DONE ON 2-11,18, 25.  THE IV NURSE CAN DRAW FROM IV START IN INFUSION ON Monday. SHE HAS ATIVAN AND ZOFRAN AT HOME FOR NAUSEA. SHE WILL BRING IN FMLA PAPERS NEXT WEEK  IF THEY NEED REVISING. TOLD HER THAT DR. Darrold Span WANTED TO SEE WHEN THE CT/ CHEMO WAS SCHEDULED AND WILL SEND ORDERS TO THE SCHEDULERS FOR FOLLOW UP APPT. WITH HER.

## 2011-03-12 ENCOUNTER — Other Ambulatory Visit: Payer: Self-pay | Admitting: Oncology

## 2011-03-12 DIAGNOSIS — D392 Neoplasm of uncertain behavior of placenta: Secondary | ICD-10-CM

## 2011-03-14 ENCOUNTER — Other Ambulatory Visit: Payer: 59 | Admitting: Lab

## 2011-03-14 ENCOUNTER — Ambulatory Visit (HOSPITAL_BASED_OUTPATIENT_CLINIC_OR_DEPARTMENT_OTHER): Payer: 59

## 2011-03-14 ENCOUNTER — Other Ambulatory Visit: Payer: Self-pay | Admitting: Oncology

## 2011-03-14 VITALS — BP 105/69 | HR 69 | Temp 98.5°F

## 2011-03-14 DIAGNOSIS — Z5111 Encounter for antineoplastic chemotherapy: Secondary | ICD-10-CM

## 2011-03-14 DIAGNOSIS — D392 Neoplasm of uncertain behavior of placenta: Secondary | ICD-10-CM

## 2011-03-14 LAB — HCG, QUANTITATIVE, PREGNANCY: hCG, Beta Chain, Quant, S: 11.9 m[IU]/mL

## 2011-03-14 MED ORDER — DEXAMETHASONE SODIUM PHOSPHATE 10 MG/ML IJ SOLN
10.0000 mg | Freq: Once | INTRAMUSCULAR | Status: AC
  Administered 2011-03-14: 10 mg via INTRAVENOUS

## 2011-03-14 MED ORDER — ONDANSETRON 8 MG/50ML IVPB (CHCC)
8.0000 mg | Freq: Once | INTRAVENOUS | Status: AC
  Administered 2011-03-14: 8 mg via INTRAVENOUS

## 2011-03-14 MED ORDER — SODIUM CHLORIDE 0.9 % IV SOLN
12.0000 ug/kg | Freq: Once | INTRAVENOUS | Status: AC
  Administered 2011-03-14: 900 ug via INTRAVENOUS
  Filled 2011-03-14: qty 1.8

## 2011-03-14 MED ORDER — SODIUM CHLORIDE 0.9 % IV SOLN
Freq: Once | INTRAVENOUS | Status: AC
  Administered 2011-03-14: 09:00:00 via INTRAVENOUS

## 2011-03-14 NOTE — Patient Instructions (Signed)
Call md for problems.  D Rinda Rollyson rn 

## 2011-03-15 ENCOUNTER — Other Ambulatory Visit: Payer: Self-pay | Admitting: *Deleted

## 2011-03-15 ENCOUNTER — Other Ambulatory Visit: Payer: 59 | Admitting: Lab

## 2011-03-15 ENCOUNTER — Ambulatory Visit (HOSPITAL_COMMUNITY)
Admission: RE | Admit: 2011-03-15 | Discharge: 2011-03-15 | Disposition: A | Discharge status: Home / Self Care | Payer: 59 | Source: Ambulatory Visit | Attending: Oncology | Admitting: Oncology

## 2011-03-15 ENCOUNTER — Other Ambulatory Visit: Payer: Self-pay | Admitting: Oncology

## 2011-03-15 ENCOUNTER — Encounter: Payer: 59 | Admitting: Oncology

## 2011-03-15 ENCOUNTER — Ambulatory Visit (HOSPITAL_BASED_OUTPATIENT_CLINIC_OR_DEPARTMENT_OTHER): Payer: 59

## 2011-03-15 ENCOUNTER — Other Ambulatory Visit (HOSPITAL_COMMUNITY): Payer: 59

## 2011-03-15 ENCOUNTER — Telehealth: Payer: Self-pay | Admitting: Certified Registered Nurse Anesthetist

## 2011-03-15 DIAGNOSIS — D392 Neoplasm of uncertain behavior of placenta: Secondary | ICD-10-CM

## 2011-03-15 DIAGNOSIS — Z5111 Encounter for antineoplastic chemotherapy: Secondary | ICD-10-CM

## 2011-03-15 DIAGNOSIS — Z483 Aftercare following surgery for neoplasm: Secondary | ICD-10-CM | POA: Insufficient documentation

## 2011-03-15 DIAGNOSIS — O019 Hydatidiform mole, unspecified: Secondary | ICD-10-CM

## 2011-03-15 DIAGNOSIS — Z8542 Personal history of malignant neoplasm of other parts of uterus: Secondary | ICD-10-CM | POA: Insufficient documentation

## 2011-03-15 MED ORDER — LIDOCAINE HCL 1 % IJ SOLN
INTRAMUSCULAR | Status: AC
  Filled 2011-03-15: qty 20

## 2011-03-15 MED ORDER — SODIUM CHLORIDE 0.9 % IV SOLN
Freq: Once | INTRAVENOUS | Status: AC
  Administered 2011-03-15: 14:00:00 via INTRAVENOUS

## 2011-03-15 MED ORDER — ONDANSETRON HCL 8 MG PO TABS: ORAL_TABLET | ORAL | Status: DC

## 2011-03-15 MED ORDER — SODIUM CHLORIDE 0.9 % IV SOLN
12.0000 ug/kg | Freq: Once | INTRAVENOUS | Status: AC
  Administered 2011-03-15: 900 ug via INTRAVENOUS
  Filled 2011-03-15: qty 1.8

## 2011-03-15 MED ORDER — IOHEXOL 300 MG/ML  SOLN
100.0000 mL | Freq: Once | INTRAMUSCULAR | Status: AC | PRN
  Administered 2011-03-15: 100 mL via INTRAVENOUS

## 2011-03-15 MED ORDER — ONDANSETRON 8 MG/50ML IVPB (CHCC)
8.0000 mg | Freq: Once | INTRAVENOUS | Status: AC
  Administered 2011-03-15: 8 mg via INTRAVENOUS

## 2011-03-15 MED ORDER — DEXAMETHASONE SODIUM PHOSPHATE 10 MG/ML IJ SOLN
10.0000 mg | Freq: Once | INTRAMUSCULAR | Status: AC
  Administered 2011-03-15: 10 mg via INTRAVENOUS

## 2011-03-15 NOTE — Patient Instructions (Signed)
[facility] Discharge Instructions for Patients Receiving Chemotherapy  Today you received the following chemotherapy agents  Dactinomycian. To help prevent nausea and vomiting after your treatment, we encourage you to take your nausea medication as prescribed by your physician.   If you develop nausea and vomiting that is not controlled by your nausea medication, call the clinic. If it is after clinic hours your family physician or the after hours number for the clinic or go to the Emergency Department.   BELOW ARE SYMPTOMS THAT SHOULD BE REPORTED IMMEDIATELY:  *FEVER GREATER THAN 100.5 F  *CHILLS WITH OR WITHOUT FEVER  NAUSEA AND VOMITING THAT IS NOT CONTROLLED WITH YOUR NAUSEA MEDICATION  *UNUSUAL SHORTNESS OF BREATH  *UNUSUAL BRUISING OR BLEEDING  TENDERNESS IN MOUTH AND THROAT WITH OR WITHOUT PRESENCE OF ULCERS  *URINARY PROBLEMS  *BOWEL PROBLEMS  UNUSUAL RASH Items with * indicate a potential emergency and should be followed up as soon as possible.   Feel free to call the clinic you have any questions or concerns. The clinic phone number is [phone].   I have been informed and understand all the instructions given to me. I know to contact the clinic, my physician, or go to the Emergency Department if any problems should occur. I do not have any questions at this time, but understand that I may call the clinic during office hours   should I have any questions or need assistance in obtaining follow up care.    __________________________________________  _____________  __________ Signature of Patient or Authorized Representative            Date                   Time    __________________________________________ Nurse's Signature

## 2011-03-15 NOTE — Procedures (Signed)
US/ Fluoroscopic guided right DL PICC placed. Length 38 cm. Tip SVC/RA junction. No immediate complications.

## 2011-03-15 NOTE — Progress Notes (Signed)
OFFICE PROGRESS NOTE Date of Visit: 03-15-2011 Physicians: P.Gehrig, T.Henley  INTERVAL HISTORY:   Patient is seen in infusion area, receiving day 2 cycle 1 of actinomycin D for rising B- HCG despite initial chemotherapy followed by hysterectomy for gestational trophoblastic disease.   Objective:  Vital signs in last 24 hours:  LMP 05/31/2010        Lab Results:  No results found for this basename: WBC:2,HGB:2,HCT:2,PLT:2 in the last 72 hours  BMET No results found for this basename: NA:2,K:2,CL:2,CO2:2,GLUCOSE:2,BUN:2,CREATININE:2,CALCIUM:2 in the last 72 hours  Studies/Results:  Ct Chest W Contrast  03/15/2011  *RADIOLOGY REPORT*  Clinical Data:  Gestational trophoblastic disease.  Prior molar pregnancy.  Elevated tumor markers.  CT CHEST, ABDOMEN AND PELVIS WITH CONTRAST  Technique:  Multidetector CT imaging of the chest, abdomen and pelvis was performed following the standard protocol during bolus administration of intravenous contrast.  Contrast: OMNIPAQUE IOHEXOL 300 MG/ML IV SOLN  Comparison:  CT 12/03/2010  CT CHEST  Findings:  No axillary or supraclavicular lymphadenopathy.  No mediastinal or hilar lymphadenopathy.  No pericardial fluid. Esophagus is normal.  Review of the lung parenchyma demonstrates no new or suspicious pulmonary nodules.  IMPRESSION: Stable exam of the chest.  No evidence of metastasis.  CT ABDOMEN AND PELVIS  Findings:  Stable low attenuation hepatic cyst within the left lateral hepatic lobe measures 34 mm not changed from 33 on prior. Gallbladder, pancreas, spleen, adrenal glands, and kidneys are normal.  Stomach, small bowel, appendix, and colon are normal.  No evidence of peritoneal disease.  No retroperitoneal periportal lymphadenopathy.  In the pelvis, interval hysterectomy.  No evidence of pelvic lymphadenopathy.  The ovaries are normal.  There is a corpus luteal cyst within the right ovary.  Small amount free fluid the posterior cul-de-sac.   Review of  bone windows demonstrates no aggressive osseous lesions.  IMPRESSION:  1.  Interval hysterectomy.  Ovaries appear normal. 2.  No evidence of metastasis within the abdomen or pelvis.  Original Report Authenticated By: Genevive Bi, M.D.   Ct Abdomen Pelvis W Contrast  03/15/2011  *RADIOLOGY REPORT*  Clinical Data:  Gestational trophoblastic disease.  Prior molar pregnancy.  Elevated tumor markers.  CT CHEST, ABDOMEN AND PELVIS WITH CONTRAST  Technique:  Multidetector CT imaging of the chest, abdomen and pelvis was performed following the standard protocol during bolus administration of intravenous contrast.  Contrast: OMNIPAQUE IOHEXOL 300 MG/ML IV SOLN  Comparison:  CT 12/03/2010  CT CHEST  Findings:  No axillary or supraclavicular lymphadenopathy.  No mediastinal or hilar lymphadenopathy.  No pericardial fluid. Esophagus is normal.  Review of the lung parenchyma demonstrates no new or suspicious pulmonary nodules.  IMPRESSION: Stable exam of the chest.  No evidence of metastasis.  CT ABDOMEN AND PELVIS  Findings:  Stable low attenuation hepatic cyst within the left lateral hepatic lobe measures 34 mm not changed from 33 on prior. Gallbladder, pancreas, spleen, adrenal glands, and kidneys are normal.  Stomach, small bowel, appendix, and colon are normal.  No evidence of peritoneal disease.  No retroperitoneal periportal lymphadenopathy.  In the pelvis, interval hysterectomy.  No evidence of pelvic lymphadenopathy.  The ovaries are normal.  There is a corpus luteal cyst within the right ovary.  Small amount free fluid the posterior cul-de-sac.  Review of  bone windows demonstrates no aggressive osseous lesions.  IMPRESSION:  1.  Interval hysterectomy.  Ovaries appear normal. 2.  No evidence of metastasis within the abdomen or pelvis.  Original Report  Authenticated By: Genevive Bi, M.D.    Medications: I have reviewed the patient's current  medications.  Assessment/Plan:          Reece Packer, MD   03/15/2011, 9:24 PM    This encounter was created in error - please disregard. This encounter was created in error - please disregard. This encounter was created in error - please disregard. This encounter was created in error - please disregard.

## 2011-03-15 NOTE — Telephone Encounter (Signed)
Added additional appts for feb/march per latest 2/12 pof. lmonvm for pt + pt to get new schedule when she comes in tomorrow. Also per pof picc by IR set up by [facility].

## 2011-03-16 ENCOUNTER — Other Ambulatory Visit: Payer: Self-pay | Admitting: Certified Registered Nurse Anesthetist

## 2011-03-16 ENCOUNTER — Ambulatory Visit (HOSPITAL_BASED_OUTPATIENT_CLINIC_OR_DEPARTMENT_OTHER): Payer: 59

## 2011-03-16 VITALS — BP 120/66 | HR 70 | Temp 98.2°F

## 2011-03-16 DIAGNOSIS — Z5111 Encounter for antineoplastic chemotherapy: Secondary | ICD-10-CM

## 2011-03-16 DIAGNOSIS — D392 Neoplasm of uncertain behavior of placenta: Secondary | ICD-10-CM

## 2011-03-16 MED ORDER — ONDANSETRON 8 MG/50ML IVPB (CHCC)
8.0000 mg | Freq: Once | INTRAVENOUS | Status: AC
  Administered 2011-03-16: 8 mg via INTRAVENOUS

## 2011-03-16 MED ORDER — SODIUM CHLORIDE 0.9 % IV SOLN
Freq: Once | INTRAVENOUS | Status: AC
  Administered 2011-03-16: 10:00:00 via INTRAVENOUS

## 2011-03-16 MED ORDER — SODIUM CHLORIDE 0.9 % IV SOLN
12.0000 ug/kg | Freq: Once | INTRAVENOUS | Status: AC
  Administered 2011-03-16: 900 ug via INTRAVENOUS
  Filled 2011-03-16: qty 1.8

## 2011-03-16 MED ORDER — HEPARIN SOD (PORK) LOCK FLUSH 100 UNIT/ML IV SOLN
500.0000 [IU] | Freq: Once | INTRAVENOUS | Status: AC | PRN
  Administered 2011-03-16: 500 [IU]
  Filled 2011-03-16: qty 5

## 2011-03-16 MED ORDER — SODIUM CHLORIDE 0.9 % IJ SOLN
10.0000 mL | INTRAMUSCULAR | Status: DC | PRN
  Administered 2011-03-16: 10 mL
  Filled 2011-03-16: qty 10

## 2011-03-16 MED ORDER — DEXAMETHASONE SODIUM PHOSPHATE 10 MG/ML IJ SOLN
10.0000 mg | Freq: Once | INTRAMUSCULAR | Status: AC
  Administered 2011-03-16: 10 mg via INTRAVENOUS

## 2011-03-17 ENCOUNTER — Ambulatory Visit (HOSPITAL_BASED_OUTPATIENT_CLINIC_OR_DEPARTMENT_OTHER): Payer: 59

## 2011-03-17 VITALS — BP 104/66 | HR 63 | Temp 98.2°F

## 2011-03-17 DIAGNOSIS — D392 Neoplasm of uncertain behavior of placenta: Secondary | ICD-10-CM

## 2011-03-17 DIAGNOSIS — Z5111 Encounter for antineoplastic chemotherapy: Secondary | ICD-10-CM

## 2011-03-17 MED ORDER — SODIUM CHLORIDE 0.9 % IV SOLN
Freq: Once | INTRAVENOUS | Status: AC
  Administered 2011-03-17: 09:00:00 via INTRAVENOUS

## 2011-03-17 MED ORDER — SODIUM CHLORIDE 0.9 % IV SOLN
12.0000 ug/kg | Freq: Once | INTRAVENOUS | Status: AC
  Administered 2011-03-17: 900 ug via INTRAVENOUS
  Filled 2011-03-17: qty 1.8

## 2011-03-17 MED ORDER — DEXAMETHASONE SODIUM PHOSPHATE 10 MG/ML IJ SOLN
10.0000 mg | Freq: Once | INTRAMUSCULAR | Status: AC
  Administered 2011-03-17: 10 mg via INTRAVENOUS

## 2011-03-17 MED ORDER — SODIUM CHLORIDE 0.9 % IJ SOLN
10.0000 mL | INTRAMUSCULAR | Status: DC | PRN
  Administered 2011-03-17: 10 mL
  Filled 2011-03-17: qty 10

## 2011-03-17 MED ORDER — HEPARIN SOD (PORK) LOCK FLUSH 100 UNIT/ML IV SOLN
250.0000 [IU] | Freq: Once | INTRAVENOUS | Status: AC | PRN
  Administered 2011-03-17: 250 [IU]
  Filled 2011-03-17: qty 5

## 2011-03-17 MED ORDER — ONDANSETRON 8 MG/50ML IVPB (CHCC)
8.0000 mg | Freq: Once | INTRAVENOUS | Status: AC
  Administered 2011-03-17: 8 mg via INTRAVENOUS

## 2011-03-18 ENCOUNTER — Other Ambulatory Visit: Payer: Self-pay | Admitting: Oncology

## 2011-03-18 ENCOUNTER — Telehealth: Payer: Self-pay

## 2011-03-18 ENCOUNTER — Ambulatory Visit (HOSPITAL_BASED_OUTPATIENT_CLINIC_OR_DEPARTMENT_OTHER): Payer: 59

## 2011-03-18 ENCOUNTER — Other Ambulatory Visit: Payer: Self-pay

## 2011-03-18 VITALS — BP 104/68 | HR 61 | Temp 98.4°F

## 2011-03-18 DIAGNOSIS — D392 Neoplasm of uncertain behavior of placenta: Secondary | ICD-10-CM

## 2011-03-18 DIAGNOSIS — Z5111 Encounter for antineoplastic chemotherapy: Secondary | ICD-10-CM

## 2011-03-18 MED ORDER — HEPARIN SOD (PORK) LOCK FLUSH 100 UNIT/ML IV SOLN
250.0000 [IU] | Freq: Once | INTRAVENOUS | Status: AC | PRN
  Administered 2011-03-18: 250 [IU]
  Filled 2011-03-18: qty 5

## 2011-03-18 MED ORDER — ONDANSETRON 8 MG/50ML IVPB (CHCC): 8.0000 mg | Freq: Once | INTRAVENOUS | Status: DC

## 2011-03-18 MED ORDER — PROMETHAZINE HCL 25 MG PO TABS
25.0000 mg | ORAL_TABLET | Freq: Four times a day (QID) | ORAL | Status: DC | PRN
  Administered 2011-03-18: 25 mg via ORAL

## 2011-03-18 MED ORDER — PROMETHAZINE HCL 25 MG PO TABS: 25.0000 mg | ORAL_TABLET | Freq: Once | ORAL | Status: DC

## 2011-03-18 MED ORDER — SODIUM CHLORIDE 0.9 % IV SOLN
Freq: Once | INTRAVENOUS | Status: AC
  Administered 2011-03-18: 09:00:00 via INTRAVENOUS

## 2011-03-18 MED ORDER — DEXAMETHASONE SODIUM PHOSPHATE 10 MG/ML IJ SOLN
10.0000 mg | Freq: Once | INTRAMUSCULAR | Status: AC
  Administered 2011-03-18: 10 mg via INTRAVENOUS

## 2011-03-18 MED ORDER — SODIUM CHLORIDE 0.9 % IV SOLN
12.0000 ug/kg | Freq: Once | INTRAVENOUS | Status: AC
  Administered 2011-03-18: 900 ug via INTRAVENOUS
  Filled 2011-03-18: qty 1.8

## 2011-03-18 MED ORDER — SODIUM CHLORIDE 0.9 % IJ SOLN
10.0000 mL | INTRAMUSCULAR | Status: DC | PRN
  Administered 2011-03-18: 10 mL
  Filled 2011-03-18: qty 10

## 2011-03-18 NOTE — Progress Notes (Signed)
0920-Pt states slight nausea relieved by Zofran taken at home.  Pt took Zofran 16mg  PO at home this morning.  Dr Darrold Span notified and order received to hold Zofran as a pre med today and to give Phenergan 25mg  PO pre Dactinomycin.  Pt does have Ativan at home to take if needed.  Pt encouraged to push fluids at home as tolerated and verbalizes an understanding to do so.

## 2011-03-18 NOTE — Telephone Encounter (Signed)
LM FOR MS. Duplessis THAT HER APPT. FOR 03-21-11 IS AT 945 INSTEAD OF 0915 FOR LAB AND FLUSH AND APPT. FOR FLUSH ON 03-23-11 IS AT 1000.  SHE CAN P/U A NEW SCHEDULE WHEN SHE COME IN FOR LAB ON 03-21-11.  SHE CAN CALL THE OFFICE IF SHE HAS ANY QUESTIONS.

## 2011-03-21 ENCOUNTER — Other Ambulatory Visit: Payer: 59 | Admitting: Lab

## 2011-03-21 ENCOUNTER — Other Ambulatory Visit (HOSPITAL_BASED_OUTPATIENT_CLINIC_OR_DEPARTMENT_OTHER): Payer: 59 | Admitting: Lab

## 2011-03-21 ENCOUNTER — Ambulatory Visit: Payer: 59

## 2011-03-21 DIAGNOSIS — D392 Neoplasm of uncertain behavior of placenta: Secondary | ICD-10-CM

## 2011-03-21 DIAGNOSIS — O019 Hydatidiform mole, unspecified: Secondary | ICD-10-CM

## 2011-03-21 LAB — CBC WITH DIFFERENTIAL/PLATELET
BASO%: 0.4 % (ref 0.0–2.0)
Basophils Absolute: 0 10*3/uL (ref 0.0–0.1)
Eosinophils Absolute: 0 10*3/uL (ref 0.0–0.5)
HCT: 40.8 % (ref 34.8–46.6)
LYMPH%: 17.2 % (ref 14.0–49.7)
MCHC: 33.7 g/dL (ref 31.5–36.0)
MONO#: 0.1 10*3/uL (ref 0.1–0.9)
NEUT%: 80 % — ABNORMAL HIGH (ref 38.4–76.8)
Platelets: 168 10*3/uL (ref 145–400)
WBC: 7.4 10*3/uL (ref 3.9–10.3)

## 2011-03-21 MED ORDER — HEPARIN SOD (PORK) LOCK FLUSH 100 UNIT/ML IV SOLN
500.0000 [IU] | Freq: Once | INTRAVENOUS | Status: AC
  Administered 2011-03-21: 500 [IU] via INTRAVENOUS
  Filled 2011-03-21: qty 5

## 2011-03-21 MED ORDER — SODIUM CHLORIDE 0.9 % IJ SOLN
10.0000 mL | INTRAMUSCULAR | Status: DC | PRN
  Administered 2011-03-21: 10 mL via INTRAVENOUS
  Filled 2011-03-21: qty 10

## 2011-03-22 ENCOUNTER — Other Ambulatory Visit: Payer: 59

## 2011-03-22 ENCOUNTER — Telehealth: Payer: Self-pay | Admitting: *Deleted

## 2011-03-22 ENCOUNTER — Other Ambulatory Visit: Payer: 59 | Admitting: Lab

## 2011-03-22 NOTE — Telephone Encounter (Signed)
Message on voicemail from pt requesting hcg level from 03/21/11. Notified MD who states ok to give results and inform pt that gyn onc has been notified as well to let pt and our office know what they would like to do next. Verbalized understanding. Encouraged to call with any other questions/concerns

## 2011-03-23 ENCOUNTER — Ambulatory Visit (HOSPITAL_BASED_OUTPATIENT_CLINIC_OR_DEPARTMENT_OTHER): Payer: 59

## 2011-03-23 ENCOUNTER — Telehealth: Payer: Self-pay | Admitting: Oncology

## 2011-03-23 DIAGNOSIS — O019 Hydatidiform mole, unspecified: Secondary | ICD-10-CM

## 2011-03-23 MED ORDER — SODIUM CHLORIDE 0.9 % IJ SOLN
10.0000 mL | INTRAMUSCULAR | Status: DC | PRN
  Administered 2011-03-23: 10 mL via INTRAVENOUS
  Filled 2011-03-23: qty 10

## 2011-03-23 MED ORDER — HEPARIN SOD (PORK) LOCK FLUSH 100 UNIT/ML IV SOLN
500.0000 [IU] | Freq: Once | INTRAVENOUS | Status: AC
  Administered 2011-03-23: 500 [IU] via INTRAVENOUS
  Filled 2011-03-23: qty 5

## 2011-03-23 NOTE — Telephone Encounter (Signed)
Spoke with gyn onc secretary and requested appt for pt with Dr.Gehrig.

## 2011-03-24 ENCOUNTER — Encounter: Payer: Self-pay | Admitting: Gynecologic Oncology

## 2011-03-24 ENCOUNTER — Ambulatory Visit: Payer: 59 | Attending: Gynecologic Oncology | Admitting: Gynecologic Oncology

## 2011-03-24 VITALS — BP 110/56 | HR 70 | Temp 98.3°F | Resp 18 | Ht 67.0 in | Wt 167.7 lb

## 2011-03-24 DIAGNOSIS — D392 Neoplasm of uncertain behavior of placenta: Secondary | ICD-10-CM | POA: Insufficient documentation

## 2011-03-24 DIAGNOSIS — Z90710 Acquired absence of both cervix and uterus: Secondary | ICD-10-CM | POA: Insufficient documentation

## 2011-03-24 DIAGNOSIS — O019 Hydatidiform mole, unspecified: Secondary | ICD-10-CM

## 2011-03-24 NOTE — Progress Notes (Signed)
Consult Note: Gyn-Onc  [name] 31 y.o. female  CC:  Chief Complaint  Patient presents with  . GTD    Follow up    HPI: 31 year old G3, P2 who was admitted to [facility] after removal of her molar pregnancy. Beta hCG on 6/29 was 244,000. Ultrasound was performed that showed a molar pregnancy and she had some bleeding. 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 HCGs were appropriately followed by Dr. Ambrose Mantle in [place]. At the time of her D&C, her hCG had actually risen to 603,000. She had a repeat CT scan on 8/15 and repeat hCG was 25,000 and she received her first injection of methotrexate. On 8/22, her hCG was 26,658. On 8/29, hCG was 14,000. She had methotrexate on 9/5, 9/12, 9/19 and 9/26 with appropriate decline in her hCGs. Her hCGs were decreasing in the range of 44%, 20%, 20%, 8.7% and 35%; however, on 10/2, her hCG was 4055, which again showed 23% drop from prior, but repeat on 10/9 hCG began to plateau and it is 3631 and actually gone up to 3870. She was referred to [facility] GYN/ONC at [facility] in October 2012. CT scan of the chest, abdomen and pelvis showed no evidence of metastatic disease. Within the uterus, she did have a 6 x 5 x 5 cm endometrial mass consistent with a molar pregnancy. Her WHO score was 7 at the time of her diagnosis being on hCG level. She was evaluated and counseled regarding treatment options. She has been started on dactinomycin last week and has been dispositioned to hysterectomy.  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, filling endometrial cavity and abuts myometrium, with focal trophoblasts extending superficially into myometrium - Lymphovascular invasion present (A6) - No serosal, cervix, lower uterine segment, or tubal involvement identified - Cervix with reactive atypia (Arias-Stella  changes), focal ulceration, and Nabothian cysts - Background endometrium with pyometrium, inactive endometrial epithelium, and chronic endometritis  Right fallopian tube - Acute and chronic salpingitis  Left fallopian tube - Acute and chronic salpingitis and focal decidualization of stroma  She did receive one pre-op dose of MTX the day of surgery. HCG titers dropped from 5919 to 6.4 on 02/16/11.  Her titers began to plateau. This low level we did send her blood for serial solutions to ensure that we were not dealing with a phantom hCG.  The HCG is NOT phantom and is real. HCG then increased to 10.7 on January 22, 6.6 on January 29, 9.9 on February 5, 11.9 on February 11. I February 8 of year 2013 chest CT scan of the chest abdomen and pelvis that was negative. She started at D5 day cycle on February 11. Her hCG on day 7 was 21.1. She became quite nervous and frustrated by this and sought to come in today for evaluation.   She is wondering if she should go to a comprehensive Center at that specializes in hCG. I discussed with her that I been working closely with Ronita Hipps at [facility] with regards to her entire treatment from the time we became involved in on November. This did provide her with some relief she saw that his name was on many of the publications have been written. She was offered a second opinion with him discomforted to know that I have been discussing this with him and at this point wishes to  defer. She understands that we do see what day 1 for cycle 2 hCG is. I discussed with her that it is much more telling than what her day 8 hCG was.       Interval History:  As above Review of Systems  Current Meds:  Outpatient Encounter Prescriptions as of 03/24/2011  Medication Sig Dispense Refill  . Ascorbic Acid (VITAMIN C) 1000 MG tablet Take 1,000 mg by mouth daily.        . Calcium-Vitamin D-Vitamin K (CALCIUM + D + K PO) Take 1 tablet by mouth daily.        . Cyanocobalamin (VITAMIN B  12 PO) Take 1 tablet by mouth daily.        Marland Kitchen ibuprofen (ADVIL,MOTRIN) 200 MG tablet Take 400 mg by mouth daily as needed. For pain.       Marland Kitchen LORazepam (ATIVAN) 0.5 MG tablet Take 0.5 mg by mouth every 6 (six) hours as needed. MAY PUT UNDER TONGUE OR SWALLOW       . ondansetron (ZOFRAN) 8 MG tablet MAY TAKE 1-2 TABS EVERY 12 HRS AS NEEDED  30 tablet  2  . OVER THE COUNTER MEDICATION Take 1 tablet by mouth daily. Patient takes iron over the counter       . promethazine (PHENERGAN) 25 MG tablet Take 1 tablet (25 mg total) by mouth once.  30 tablet  0  . dimenhyDRINATE (DRAMAMINE) 50 MG tablet Take 50 mg by mouth every 6 (six) hours as needed. For nausea.      . methylergonovine (METHERGINE) 0.2 MG tablet Take 1 tablet (0.2 mg total) by mouth 3 (three) times daily as needed.  9 tablet  0   Facility-Administered Encounter Medications as of 03/24/2011  Medication Dose Route Frequency Provider Last Rate Last Dose  . ondansetron (ZOFRAN) IVPB 8 mg  8 mg Intravenous Once Lennis P Livesay, MD      . sodium chloride 0.9 % injection 10 mL  10 mL Intracatheter PRN Lennis Buzzy Han, MD   10 mL at 03/18/11 1035  . DISCONTD: sodium chloride 0.9 % injection 10 mL  10 mL Intravenous PRN Lennis Buzzy Han, MD   10 mL at 03/23/11 1402    Allergy: No Known Allergies  Social Hx:   History   Social History  . Marital Status: Married    Spouse Name: N/A    Number of Children: N/A  . Years of Education: N/A   Occupational History  . Not on file.   Social History Main Topics  . Smoking status: Never Smoker   . Smokeless tobacco: Not on file  . Alcohol Use: Yes     rarely  . Drug Use: No  . Sexually Active: Yes    Birth Control/ Protection: None   Other Topics Concern  . Not on file   Social History Narrative  . No narrative on file    Past Surgical Hx:  Past Surgical History  Procedure Date  . Cesarean section 2004, 2006  . Dilation and evacuation 08/12/2010    Procedure: DILATATION AND  EVACUATION (D&E);  Surgeon: Bing Plume, MD;  Location: [facility];  Service: Gynecology;  Laterality: N/A;  . Dilation and curettage of uterus   . Abdominal hysterectomy 12/17/2010    Past Medical Hx:  Past Medical History  Diagnosis Date  . Cancer     hydatidiform mole  . 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 Diabetes Other   . Diabetes Other   . Coronary artery disease Other     Vitals:  Blood pressure 110/56, pulse 70, temperature 98.3 F (36.8 C), temperature source Oral, resp. rate 18, height 5\' 7"  (1.702 m), weight 167 lb 11.2 oz (76.068 kg), last menstrual period 05/31/2010, not currently breastfeeding.  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 Dispositioned to continued dactinomycin 5 day cycle every 2 weeks at this point. We'll continue following her hCGs.  Kanishk Stroebel A., MD 03/24/2011, 2:56 PM

## 2011-03-24 NOTE — Patient Instructions (Signed)
Follow up with Dr. Livesay as scheduled. 

## 2011-03-25 ENCOUNTER — Ambulatory Visit: Payer: 59

## 2011-03-25 ENCOUNTER — Other Ambulatory Visit: Payer: 59 | Admitting: Lab

## 2011-03-25 ENCOUNTER — Encounter: Payer: Self-pay | Admitting: Oncology

## 2011-03-25 ENCOUNTER — Telehealth: Payer: Self-pay

## 2011-03-25 ENCOUNTER — Ambulatory Visit (HOSPITAL_BASED_OUTPATIENT_CLINIC_OR_DEPARTMENT_OTHER): Payer: 59 | Admitting: Oncology

## 2011-03-25 VITALS — BP 110/66 | HR 57 | Temp 98.1°F | Ht 67.0 in | Wt 169.2 lb

## 2011-03-25 DIAGNOSIS — D392 Neoplasm of uncertain behavior of placenta: Secondary | ICD-10-CM

## 2011-03-25 DIAGNOSIS — O019 Hydatidiform mole, unspecified: Secondary | ICD-10-CM

## 2011-03-25 LAB — CBC WITH DIFFERENTIAL/PLATELET
BASO%: 1 % (ref 0.0–2.0)
EOS%: 0.6 % (ref 0.0–7.0)
HCT: 34.1 % — ABNORMAL LOW (ref 34.8–46.6)
LYMPH%: 25.2 % (ref 14.0–49.7)
MCH: 28.8 pg (ref 25.1–34.0)
MCHC: 34.8 g/dL (ref 31.5–36.0)
MONO%: 3 % (ref 0.0–14.0)
NEUT%: 70.2 % (ref 38.4–76.8)
lymph#: 1.2 10*3/uL (ref 0.9–3.3)

## 2011-03-25 MED ORDER — SODIUM CHLORIDE 0.9 % IJ SOLN
10.0000 mL | INTRAMUSCULAR | Status: DC | PRN
  Administered 2011-03-25: 09:00:00 via INTRAVENOUS
  Filled 2011-03-25: qty 10

## 2011-03-25 MED ORDER — HEPARIN SOD (PORK) LOCK FLUSH 100 UNIT/ML IV SOLN
500.0000 [IU] | Freq: Once | INTRAVENOUS | Status: AC
  Administered 2011-03-25: 500 [IU] via INTRAVENOUS
  Filled 2011-03-25: qty 5

## 2011-03-25 MED ORDER — CLINDAMYCIN PHOSPHATE 1 % EX GEL: Freq: Two times a day (BID) | CUTANEOUS | Status: DC

## 2011-03-25 MED ORDER — TRIAMCINOLONE ACETONIDE 0.1 % MT PSTE: PASTE | OROMUCOSAL | Status: DC

## 2011-03-25 NOTE — Telephone Encounter (Signed)
SPOKE WITH MS. Taubman AND TOLD HER THAT HER PLT. COUNT WAS 96K TODAY AND WITH THE PICC LINE AND PLTS. LOW DR. Darrold Span FEELS THAT IT IS BEST  NOT TO WORK TOMORROW.  PT. VERBALIZED UNDERSTANDING.

## 2011-03-25 NOTE — Progress Notes (Signed)
OFFICE PROGRESS NOTE Date of Visit 03-25-2011 Physicians: P.Gehrig, T.Henley  INTERVAL HISTORY:   Patient is seen, alone for visit today, in continuing attention to her gestational trophoblastic disease, now on treatment with actinomycin D due to elevation in B HCG. She had first 5 day cycle of actinomycin D at this office 2-11 thru 03-18-2011, planned every other week. History is of molar pregnancy diagnosed June 2012, with B HCG 603,000 at [facility] 08-12-2010, complete hydatidiform mole then. Per Dr.Gehrig's subsequent consult note,  WHO score was 7 at diagnosis. B HCGs were followed closely but did not normalize and she was begun on methotrexate in August 2012. She had CT head+ CAP at [facility] in August. The marker progressively dropped until mid October, when treatment was changed to bolus actinomycinD at 1.25 mg/m2, single treatment given at [facility] on 11-22-10. Repeat CT  Head +CAP was done at [facility]11-2-12 with stable 3.2 cm simple cyst in lateral left hepatic lobe and stable small low density lesion in central liver, with enlarging hypervascular mass in endometrial canal involving right wall of uterus, no evidence of metastatic disease. She went to hysterectomy and bilateral salpingectomy at [facility] 12-17-10, with pathology showing complete hydatidaform mole 4.5x4.2x3.5 cm with focal involvement of myometrium and LVI present. She was given one pre-op dose of MTX day of surgery. B HCG dropped from 5919 to 6.4 by 02-16-11, then plateau'd (serial dilutions done and confirmed HCG). She had CT CAP on 03-15-11, with chest unremarkable, stable left hepatic cyst, no pelvic adenopathy, ovaries not remarkable with corpus luteal cyst, no CT evidence of metastatic disease. On 03-14-11 (= day 1 cycle 1 of five day actinomycin D) HCG was 11.9; HCG  03-21-11 was 21.1. Patient was seen in follow up by Dr.Gehrig on 03-24-11, with recommendation that actinomycin continue depending on subsequent weekly markers. If this is not effective, we  expect care will continue with Dr.John Soper's assistance at [facility].   Patient had PICC line placed last week for treatment. She had nausea especially the 2 days after treatment, controlled with zofran and phenergan. She had not needed antiemetics for past 3 days. She had acne-type lesions upper trunk and face, which are improving and I have written cleocin gel to use additionally. Face and scalp were sore as acneiform rash began. She has oral ulcers, but has been able to eat and drink. Bowels have been moving. She has had no bleeding. She denies headache, shortness of breath, abdominal pain. The PICC was flushed today. She is just a little fatigued today. She was very reassured by Dr.Gehrig's visit yesterday. 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 Remainder of 10 point Review of Systems negative.   Objective:  Vital signs in last 24 hours:  BP 110/66  Pulse 57  Temp(Src) 98.1 F (36.7 C) (Oral)  Ht 5\' 7"  (1.702 m)  Wt 169 lb 3.2 oz (76.749 kg)  BMI 26.50 kg/m2  LMP 05/31/2010  Breastfeeding? No  Alert, easily mobile, very pleasant as always.No alopecia HEENT:mucous membranes moist, pharynx normal without lesions and CORRECTION: aphthous ulcers beneath tongue and in posterior pharynx, no candida. PERRL.Not icteric. Acne lesions scattered on face and upper chest/upper back, not obvious in scalp.  LymphaticsCervical, supraclavicular, and axillary nodes normal.No inguinal adenopathy. Resp: clear to auscultation bilaterally and normal percussion bilaterally Cardio: regular rate and rhythm GI: soft, non-tender;  bowel sounds normal; no masses,  no organomegaly Extremities: extremities normal, atraumatic, no cyanosis or edema PICC right upper arm dressed, site not remarkable. Neuro nonfocal    Lab Results:   [facility] 03/25/11 0826  WBC 4.6    HGB 11.8  HCT 34.1*  PLT 96*    BMET No results found for this basename: NA:2,K:2,CL:2,CO2:2,GLUCOSE:2,BUN:2,CREATININE:2,CALCIUM:2 in the last 72 hours Last CMET 03-11-11 Studies/Results:  No results found.  Medications: I have reviewed the patient's current medicationsShe is using biotene mouthwash and will add baking soda mouthwash..I have added clindamycin gel topical bid and triamcinolone acetamide 0.1% dental paste.  I have let Dr.Gehrig know CBC results; I expect to treat next week as long as platelets close to 100k.  Assessment/Plan: 1. 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 Continue close monitoring together with gyn oncology. Cycle 2 of 5 day actinomycin D planned 2-25 thru 04-01-11 depending on labs. 2. PICC in  Patient had questions answered to her satisfaction and was in agreement with plan.         Noelene Gang P, MD   03/25/2011, 8:11 PM

## 2011-03-25 NOTE — Patient Instructions (Signed)
May work one day this weekend if feeling well --  Note given   We will  Call in topical cleocin and dental paste to [facility] outpatient pharmacy to pick up today  Continue treatments as planned next week

## 2011-03-26 ENCOUNTER — Other Ambulatory Visit: Payer: Self-pay | Admitting: *Deleted

## 2011-03-27 ENCOUNTER — Other Ambulatory Visit: Payer: Self-pay | Admitting: Oncology

## 2011-03-27 ENCOUNTER — Encounter: Payer: Self-pay | Admitting: Oncology

## 2011-03-27 NOTE — Progress Notes (Signed)
DOCUMENTATION NOTE for 03-14-11   Patient seen in infusion, to receive day 2 cycle 1 five day actinomycin D for rising BHCG in setting of gestational trophoblastic disease. Husband here, whom I had not met previously. If peripheral IV access has to be restarted daily, PICC may be preferable. She is also for CT 03-15-2011. She understands and is in agreement with treatment plan. She has spoken with gyn oncology by phone.  DOCUMENTATION NOTE for 03-15-11. CT CAP this day no measurable disease and patient aware.  PICC preferable to peripheral access. Tolerated day 1 treatment without difficulty.

## 2011-03-28 ENCOUNTER — Ambulatory Visit (HOSPITAL_BASED_OUTPATIENT_CLINIC_OR_DEPARTMENT_OTHER): Payer: 59

## 2011-03-28 ENCOUNTER — Other Ambulatory Visit: Payer: 59

## 2011-03-28 VITALS — BP 109/39 | HR 61 | Temp 98.0°F

## 2011-03-28 DIAGNOSIS — Z5111 Encounter for antineoplastic chemotherapy: Secondary | ICD-10-CM

## 2011-03-28 DIAGNOSIS — D392 Neoplasm of uncertain behavior of placenta: Secondary | ICD-10-CM

## 2011-03-28 LAB — CBC WITH DIFFERENTIAL/PLATELET
Basophils Absolute: 0.1 10*3/uL (ref 0.0–0.1)
EOS%: 0.3 % (ref 0.0–7.0)
Eosinophils Absolute: 0 10*3/uL (ref 0.0–0.5)
HCT: 35.4 % (ref 34.8–46.6)
HGB: 11.8 g/dL (ref 11.6–15.9)
MCH: 27.4 pg (ref 25.1–34.0)
MCV: 82.1 fL (ref 79.5–101.0)
MONO%: 8.6 % (ref 0.0–14.0)
NEUT%: 62.9 % (ref 38.4–76.8)
Platelets: 174 10*3/uL (ref 145–400)

## 2011-03-28 LAB — COMPREHENSIVE METABOLIC PANEL
AST: 20 U/L (ref 0–37)
Alkaline Phosphatase: 56 U/L (ref 39–117)
BUN: 16 mg/dL (ref 6–23)
Creatinine, Ser: 0.56 mg/dL (ref 0.50–1.10)
Potassium: 4.1 mEq/L (ref 3.5–5.3)
Total Bilirubin: 0.3 mg/dL (ref 0.3–1.2)

## 2011-03-28 MED ORDER — ONDANSETRON 8 MG/50ML IVPB (CHCC)
8.0000 mg | Freq: Once | INTRAVENOUS | Status: AC
  Administered 2011-03-28: 8 mg via INTRAVENOUS

## 2011-03-28 MED ORDER — SODIUM CHLORIDE 0.9 % IJ SOLN
10.0000 mL | INTRAMUSCULAR | Status: DC | PRN
  Filled 2011-03-28: qty 10

## 2011-03-28 MED ORDER — SODIUM CHLORIDE 0.9 % IV SOLN
12.0000 ug/kg | Freq: Once | INTRAVENOUS | Status: AC
  Administered 2011-03-28: 900 ug via INTRAVENOUS
  Filled 2011-03-28: qty 1.8

## 2011-03-28 MED ORDER — SODIUM CHLORIDE 0.9 % IV SOLN
Freq: Once | INTRAVENOUS | Status: AC
  Administered 2011-03-28: 11:00:00 via INTRAVENOUS

## 2011-03-28 MED ORDER — HEPARIN SOD (PORK) LOCK FLUSH 100 UNIT/ML IV SOLN
250.0000 [IU] | Freq: Once | INTRAVENOUS | Status: DC | PRN
  Filled 2011-03-28: qty 5

## 2011-03-28 MED ORDER — DEXAMETHASONE SODIUM PHOSPHATE 10 MG/ML IJ SOLN
10.0000 mg | Freq: Once | INTRAMUSCULAR | Status: AC
  Administered 2011-03-28: 10 mg via INTRAVENOUS

## 2011-03-28 NOTE — Patient Instructions (Signed)
[facility] Discharge Instructions for Patients Receiving Chemotherapy  Today you received the following chemotherapy agents Dactinomycin  To help prevent nausea and vomiting after your treatment, we encourage you to take your nausea medication as directed by MD   If you develop nausea and vomiting that is not controlled by your nausea medication, call the clinic. If it is after clinic hours your family physician or the after hours number for the clinic or go to the Emergency Department.   BELOW ARE SYMPTOMS THAT SHOULD BE REPORTED IMMEDIATELY:  *FEVER GREATER THAN 100.5 F  *CHILLS WITH OR WITHOUT FEVER  NAUSEA AND VOMITING THAT IS NOT CONTROLLED WITH YOUR NAUSEA MEDICATION  *UNUSUAL SHORTNESS OF BREATH  *UNUSUAL BRUISING OR BLEEDING  TENDERNESS IN MOUTH AND THROAT WITH OR WITHOUT PRESENCE OF ULCERS  *URINARY PROBLEMS  *BOWEL PROBLEMS  UNUSUAL RASH Items with * indicate a potential emergency and should be followed up as soon as possible.  One of the nurses will contact you 24 hours after your first treatment. Please let the nurse know about any problems that you may have experienced. Feel free to call the clinic you have any questions or concerns. The clinic phone number is [phone].   I have been informed and understand all the instructions given to me. I know to contact the clinic, my physician, or go to the Emergency Department if any problems should occur. I do not have any questions at this time, but understand that I may call the clinic during office hours   should I have any questions or need assistance in obtaining follow up care.    __________________________________________  _____________  __________ Signature of Patient or Authorized Representative            Date                   Time    __________________________________________ Nurse's Signature

## 2011-03-29 ENCOUNTER — Ambulatory Visit (HOSPITAL_BASED_OUTPATIENT_CLINIC_OR_DEPARTMENT_OTHER): Payer: 59

## 2011-03-29 ENCOUNTER — Other Ambulatory Visit: Payer: 59

## 2011-03-29 VITALS — BP 113/67 | HR 64 | Temp 97.6°F

## 2011-03-29 DIAGNOSIS — Z5111 Encounter for antineoplastic chemotherapy: Secondary | ICD-10-CM

## 2011-03-29 DIAGNOSIS — D392 Neoplasm of uncertain behavior of placenta: Secondary | ICD-10-CM

## 2011-03-29 MED ORDER — SODIUM CHLORIDE 0.9 % IV SOLN
Freq: Once | INTRAVENOUS | Status: AC
  Administered 2011-03-29: 12:00:00 via INTRAVENOUS

## 2011-03-29 MED ORDER — DEXAMETHASONE SODIUM PHOSPHATE 10 MG/ML IJ SOLN
10.0000 mg | Freq: Once | INTRAMUSCULAR | Status: AC
  Administered 2011-03-29: 10 mg via INTRAVENOUS

## 2011-03-29 MED ORDER — SODIUM CHLORIDE 0.9 % IJ SOLN
10.0000 mL | INTRAMUSCULAR | Status: DC | PRN
Start: 2011-03-29 — End: 2011-03-29
  Administered 2011-03-29: 10 mL
  Filled 2011-03-29: qty 10

## 2011-03-29 MED ORDER — HEPARIN SOD (PORK) LOCK FLUSH 100 UNIT/ML IV SOLN
500.0000 [IU] | Freq: Once | INTRAVENOUS | Status: AC | PRN
  Administered 2011-03-29: 500 [IU]
  Filled 2011-03-29: qty 5

## 2011-03-29 MED ORDER — ONDANSETRON 8 MG/50ML IVPB (CHCC)
8.0000 mg | Freq: Once | INTRAVENOUS | Status: AC
  Administered 2011-03-29: 8 mg via INTRAVENOUS

## 2011-03-29 MED ORDER — SODIUM CHLORIDE 0.9 % IV SOLN
12.0000 ug/kg | Freq: Once | INTRAVENOUS | Status: AC
  Administered 2011-03-29: 900 ug via INTRAVENOUS
  Filled 2011-03-29: qty 1.8

## 2011-03-29 NOTE — Patient Instructions (Signed)
Patient aware of next appointment.

## 2011-03-30 ENCOUNTER — Ambulatory Visit (HOSPITAL_BASED_OUTPATIENT_CLINIC_OR_DEPARTMENT_OTHER): Payer: 59

## 2011-03-30 VITALS — BP 110/65 | HR 57 | Temp 98.3°F

## 2011-03-30 DIAGNOSIS — Z5111 Encounter for antineoplastic chemotherapy: Secondary | ICD-10-CM

## 2011-03-30 DIAGNOSIS — D392 Neoplasm of uncertain behavior of placenta: Secondary | ICD-10-CM

## 2011-03-30 MED ORDER — DACTINOMYCIN CHEMO INJECTION 0.5 MG
12.0000 ug/kg | Freq: Once | INTRAVENOUS | Status: AC
  Administered 2011-03-30: 900 ug via INTRAVENOUS
  Filled 2011-03-30: qty 1.8

## 2011-03-30 MED ORDER — SODIUM CHLORIDE 0.9 % IJ SOLN
10.0000 mL | INTRAMUSCULAR | Status: DC | PRN
  Administered 2011-03-30: 10 mL
  Filled 2011-03-30: qty 10

## 2011-03-30 MED ORDER — HEPARIN SOD (PORK) LOCK FLUSH 100 UNIT/ML IV SOLN
250.0000 [IU] | Freq: Once | INTRAVENOUS | Status: AC | PRN
  Administered 2011-03-30: 250 [IU]
  Filled 2011-03-30: qty 5

## 2011-03-30 MED ORDER — ONDANSETRON 8 MG/50ML IVPB (CHCC)
8.0000 mg | Freq: Once | INTRAVENOUS | Status: AC
  Administered 2011-03-30: 8 mg via INTRAVENOUS

## 2011-03-30 MED ORDER — DEXAMETHASONE SODIUM PHOSPHATE 10 MG/ML IJ SOLN
10.0000 mg | Freq: Once | INTRAMUSCULAR | Status: AC
  Administered 2011-03-30: 10 mg via INTRAVENOUS

## 2011-03-30 MED ORDER — SODIUM CHLORIDE 0.9 % IV SOLN
Freq: Once | INTRAVENOUS | Status: AC
  Administered 2011-03-30: 12:00:00 via INTRAVENOUS

## 2011-03-31 ENCOUNTER — Other Ambulatory Visit: Payer: Self-pay | Admitting: Certified Registered Nurse Anesthetist

## 2011-03-31 ENCOUNTER — Ambulatory Visit (HOSPITAL_BASED_OUTPATIENT_CLINIC_OR_DEPARTMENT_OTHER): Payer: 59

## 2011-03-31 VITALS — BP 108/71 | HR 65 | Temp 98.2°F

## 2011-03-31 DIAGNOSIS — D392 Neoplasm of uncertain behavior of placenta: Secondary | ICD-10-CM

## 2011-03-31 MED ORDER — DEXAMETHASONE SODIUM PHOSPHATE 10 MG/ML IJ SOLN
10.0000 mg | Freq: Once | INTRAMUSCULAR | Status: AC
  Administered 2011-03-31: 10 mg via INTRAVENOUS

## 2011-03-31 MED ORDER — SODIUM CHLORIDE 0.9 % IV SOLN
12.0000 ug/kg | Freq: Once | INTRAVENOUS | Status: AC
  Administered 2011-03-31: 900 ug via INTRAVENOUS
  Filled 2011-03-31: qty 1.8

## 2011-03-31 MED ORDER — ONDANSETRON 8 MG/50ML IVPB (CHCC)
8.0000 mg | Freq: Once | INTRAVENOUS | Status: AC
  Administered 2011-03-31: 8 mg via INTRAVENOUS

## 2011-03-31 MED ORDER — SODIUM CHLORIDE 0.9 % IV SOLN
Freq: Once | INTRAVENOUS | Status: AC
Start: 2011-03-31 — End: 2011-03-31
  Administered 2011-03-31: 13:00:00 via INTRAVENOUS

## 2011-03-31 MED ORDER — SODIUM CHLORIDE 0.9 % IJ SOLN
10.0000 mL | INTRAMUSCULAR | Status: DC | PRN
  Administered 2011-03-31: 10 mL
  Filled 2011-03-31: qty 10

## 2011-03-31 MED ORDER — HEPARIN SOD (PORK) LOCK FLUSH 100 UNIT/ML IV SOLN
250.0000 [IU] | Freq: Once | INTRAVENOUS | Status: AC | PRN
  Administered 2011-03-31: 250 [IU]
  Filled 2011-03-31: qty 5

## 2011-04-01 ENCOUNTER — Ambulatory Visit (HOSPITAL_BASED_OUTPATIENT_CLINIC_OR_DEPARTMENT_OTHER): Payer: 59

## 2011-04-01 ENCOUNTER — Ambulatory Visit: Payer: 59

## 2011-04-01 ENCOUNTER — Ambulatory Visit (HOSPITAL_BASED_OUTPATIENT_CLINIC_OR_DEPARTMENT_OTHER): Payer: 59 | Admitting: Oncology

## 2011-04-01 VITALS — BP 104/70 | HR 59 | Temp 97.3°F | Wt 171.1 lb

## 2011-04-01 VITALS — BP 97/57 | HR 55 | Temp 97.2°F

## 2011-04-01 DIAGNOSIS — D392 Neoplasm of uncertain behavior of placenta: Secondary | ICD-10-CM

## 2011-04-01 MED ORDER — SODIUM CHLORIDE 0.9 % IJ SOLN
10.0000 mL | INTRAMUSCULAR | Status: DC | PRN
  Filled 2011-04-01: qty 10

## 2011-04-01 MED ORDER — ONDANSETRON 8 MG/50ML IVPB (CHCC)
8.0000 mg | Freq: Once | INTRAVENOUS | Status: AC
  Administered 2011-04-01: 8 mg via INTRAVENOUS

## 2011-04-01 MED ORDER — HEPARIN SOD (PORK) LOCK FLUSH 100 UNIT/ML IV SOLN
500.0000 [IU] | Freq: Once | INTRAVENOUS | Status: DC | PRN
  Filled 2011-04-01: qty 5

## 2011-04-01 MED ORDER — DEXAMETHASONE SODIUM PHOSPHATE 10 MG/ML IJ SOLN
10.0000 mg | Freq: Once | INTRAMUSCULAR | Status: AC
  Administered 2011-04-01: 10 mg via INTRAVENOUS

## 2011-04-01 MED ORDER — SODIUM CHLORIDE 0.9 % IV SOLN
12.0000 ug/kg | Freq: Once | INTRAVENOUS | Status: AC
  Administered 2011-04-01: 900 ug via INTRAVENOUS
  Filled 2011-04-01: qty 1.8

## 2011-04-01 MED ORDER — SODIUM CHLORIDE 0.9 % IV SOLN
Freq: Once | INTRAVENOUS | Status: AC
  Administered 2011-04-01: 13:00:00 via INTRAVENOUS

## 2011-04-01 MED ORDER — HEPARIN SOD (PORK) LOCK FLUSH 100 UNIT/ML IV SOLN
250.0000 [IU] | Freq: Once | INTRAVENOUS | Status: DC | PRN
  Filled 2011-04-01: qty 5

## 2011-04-01 NOTE — Patient Instructions (Signed)
Patient aware of next appointment; discharged home with no complaints. 

## 2011-04-02 ENCOUNTER — Encounter: Payer: Self-pay | Admitting: Oncology

## 2011-04-02 NOTE — Progress Notes (Signed)
OFFICE PROGRESS NOTE Date of Visit 04-01-2011 Physicians; P.Gehrig, T.Henley  INTERVAL HISTORY:  Patient is seen, alone for most of visit and accompanied by RN friend at end of visit, in continuing attention to her recently progressive gestational trophoblastic disease, to complete second 5 day cycle of actinomycin today.   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 Per Dr.Gehrig's subsequent consult note, WHO score was 7 at diagnosis. B HCGs were followed closely but did not normalize and she was begun on methotrexate in August 2012. She had CT head+ CAP at [facility] in August. The marker progressively dropped until mid October, when treatment was changed to bolus actinomycinD at 1.25 mg/m2, single treatment given at [facility] on 11-22-10. Repeat CT Head +CAP was done at [facility]11-2-12 with stable 3.2 cm simple cyst in lateral left hepatic lobe and stable small low density lesion in central liver, with enlarging hypervascular mass in endometrial canal involving right wall of uterus, no evidence of metastatic disease. 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 She was given one pre-op dose of MTX day of surgery. B HCG dropped from 5919 to 6.4 by 02-16-11, then plateau'd (serial dilutions done and confirmed HCG). She had CT CAP on 03-15-11, with chest unremarkable, stable left hepatic cyst, no pelvic adenopathy, ovaries not remarkable with corpus luteal cyst, no CT evidence of metastatic disease. On 03-14-11 (= day 1 cycle 1 of five day actinomycin D) HCG was 11.9; HCG 03-21-11 was 21.1. Patient was seen in follow up by Dr.Gehrig on 03-24-11, with recommendation that actinomycin continue depending on subsequent weekly markers. If this is not effective, we expect care will continue with Dr.John Soper's  assistance at [facility]. She had first cycle of 5 day actinomycin D 2-11 thru 03-18-2011, and second cycle in progress 2-25 thru 04-01-2011. She has PICC RUE. ANC was 2.4 and platelets were 174k on day 1 cycle 2 (platelets 96k on day 12 cycle 1).  BHCG was down to 5.8 on 03-28-2011, this 11.9 on 03-14-11 (and up to 21.1 on 03-21-2011). BHCG is to be checked weekly; number of actinomycin D treatments will be per gyn oncology.  [name] has tolerated treatments this week generally well. Oral ulcers with first cycle improved quickly with triamcinolone dental paste and facial rash has not flared this week; she does have clindamycin gel for rash if needed. She has slight nausea this am better with zofran x1. She has had no problems with PICC, which she can have flushed by RN at home on Wednesdays of the off week (flush with weekly labs on Mon and dressing changes + flush Fri at [facility]). She has been eating and is able to sleep. Bowels have been ok this week, tho she had diarrhea on ~ day 7 cycle 1 and can use careful imodium if this recurs. She is having no bleeding, no abdominal or pelvic discomfort, no respiratory symptoms, no neurologic symptoms, no swelling LE, no fever or symptoms of infection. Remainder of 10 point Review of Systems negative. Objective:  Vital signs in last 24 hours:  BP 104/70  Pulse 59  Temp(Src) 97.3 F (36.3 C) (Oral)  Wt 171 lb 1.6 oz (77.61 kg)  LMP 05/31/2010  Breastfeeding? No  Alert, easily mobile, very pleasant as always, NAD.   HEENT:mucous membranes moist, pharynx normal without lesions. No aphthous ulcers  seen including posterior pharynx or beneath tongue. LymphaticsCervical, supraclavicular, and axillary nodes normal. Resp: clear to auscultation bilaterally and normal percussion bilaterally Cardio: regular rate and rhythm GI: soft, non-tender; bowel sounds normal; no masses,  no organomegaly Extremities: extremities normal, atraumatic, no cyanosis or edema PICC-without  erythema or tenderness. Skin: minimal acneiform rash on face without evidence of infection. Lab Results: CBC 2-25 as above, hgb 11.8 No results found for this basename: WBC:2,HGB:2,HCT:2,PLT:2 in the last 72 hours  BMET No results found for this basename: NA:2,K:2,CL:2,CO2:2,GLUCOSE:2,BUN:2,CREATININE:2,CALCIUM:2 in the last 72 hours CMET also 03-28-2011 entirely normal  B HCG results as above Studies/Results:  No results found.  Medications: I have reviewed the patient's current medications.  Assessment/Plan:  1.Gestational trophoblastic disease: post treatment as above including hysterectomy, with recent rise in marker and now on actinomycin daily x 5 q 2 weeks. She will continue weekly labs (+PICC care) and will be set up for cycle 3 3-11 thru 04-15-2011 as long as ANC >= approximately 1.5 and plt >= approximately 100k. She will see midlevel at this office at least 04-22-2011. I expect to remain in close communication with gyn oncology as we follow marker, as they will give instructions for # of cycles of present actinomycin D 2.PICC in place  Patient was in agreement with plan as above.      [name] P, MD   04/02/2011, 1:52 PM

## 2011-04-04 ENCOUNTER — Other Ambulatory Visit: Payer: 59 | Admitting: Lab

## 2011-04-04 ENCOUNTER — Ambulatory Visit (HOSPITAL_BASED_OUTPATIENT_CLINIC_OR_DEPARTMENT_OTHER): Payer: 59

## 2011-04-04 DIAGNOSIS — O019 Hydatidiform mole, unspecified: Secondary | ICD-10-CM

## 2011-04-04 DIAGNOSIS — D392 Neoplasm of uncertain behavior of placenta: Secondary | ICD-10-CM

## 2011-04-04 LAB — CBC WITH DIFFERENTIAL/PLATELET
BASO%: 0.6 % (ref 0.0–2.0)
EOS%: 0 % (ref 0.0–7.0)
HCT: 37.4 % (ref 34.8–46.6)
LYMPH%: 28.8 % (ref 14.0–49.7)
MCH: 28.5 pg (ref 25.1–34.0)
MCHC: 34 g/dL (ref 31.5–36.0)
MCV: 83.7 fL (ref 79.5–101.0)
NEUT%: 63.8 % (ref 38.4–76.8)
Platelets: 281 10*3/uL (ref 145–400)

## 2011-04-04 LAB — COMPREHENSIVE METABOLIC PANEL
ALT: 13 U/L (ref 0–35)
AST: 17 U/L (ref 0–37)
BUN: 7 mg/dL (ref 6–23)
Creatinine, Ser: 0.73 mg/dL (ref 0.50–1.10)
Total Bilirubin: 0.3 mg/dL (ref 0.3–1.2)

## 2011-04-04 LAB — HCG, QUANTITATIVE, PREGNANCY: hCG, Beta Chain, Quant, S: 6.1 m[IU]/mL

## 2011-04-04 MED ORDER — HEPARIN SOD (PORK) LOCK FLUSH 100 UNIT/ML IV SOLN
500.0000 [IU] | Freq: Once | INTRAVENOUS | Status: AC
  Administered 2011-04-04: 500 [IU] via INTRAVENOUS
  Filled 2011-04-04: qty 5

## 2011-04-04 MED ORDER — SODIUM CHLORIDE 0.9 % IJ SOLN
10.0000 mL | INTRAMUSCULAR | Status: DC | PRN
  Administered 2011-04-04: 10 mL via INTRAVENOUS
  Filled 2011-04-04: qty 10

## 2011-04-07 ENCOUNTER — Other Ambulatory Visit: Payer: Self-pay | Admitting: Physician Assistant

## 2011-04-07 ENCOUNTER — Telehealth: Payer: Self-pay | Admitting: *Deleted

## 2011-04-07 NOTE — Telephone Encounter (Signed)
Ms. Harkey called asking what to do about possible sinus or cold which won't go away. And noted blood with stools.  Asked about her Beta hCG chain results.   1. Lab results given, = 6.1.   2.   Reports sinus congestion and headache started 04-03-11 and she has taken dayquil and advil on Monday.  In the morning she has green, thick drainage and the headache is at the top of her head.  Denies feeling like she has a fever.  Temperature checked during call = 97.8 at 8:59 am.  Encouraged to drink water and symptom management with Dayquil (phenlephrine) is okay to use.    3.   Reports having a bm last night with bright red blood.  Another bm this morning with blood.  Was constipated and impacted having not having a bm sat, Sun or Mon.  Blood drips out before the stool comes out.  Denies abd pain or swelling, just gassy.  Reports hemorhoids with pregnancy.  Uses [facility].  Will notify providers.

## 2011-04-07 NOTE — Telephone Encounter (Signed)
Amy Loft PA-C notified of patient's call.  Verbal orders received and read back.  [name] was given instructions to call [name] about sinuses.  [name] has managed sinuses.  Instructions to take colace stool softener and miralax for bowels to prevent straining.  Encouraged to drink at least 64 oz water daily.  If bleeding persists, tell flush nurse at tomorrow's appt. To notify PA for further evaluation.  This nurse reviewed how to mix and take Miralax.  Suggested she mix with eight oz and take 4 oz daily if she feels bowels are too loose.  Denies questions.

## 2011-04-08 ENCOUNTER — Ambulatory Visit (HOSPITAL_BASED_OUTPATIENT_CLINIC_OR_DEPARTMENT_OTHER): Payer: 59

## 2011-04-08 DIAGNOSIS — O019 Hydatidiform mole, unspecified: Secondary | ICD-10-CM

## 2011-04-08 DIAGNOSIS — Z469 Encounter for fitting and adjustment of unspecified device: Secondary | ICD-10-CM

## 2011-04-08 MED ORDER — SODIUM CHLORIDE 0.9 % IJ SOLN
10.0000 mL | INTRAMUSCULAR | Status: DC | PRN
  Administered 2011-04-08: 10 mL via INTRAVENOUS
  Filled 2011-04-08: qty 10

## 2011-04-08 MED ORDER — HEPARIN SOD (PORK) LOCK FLUSH 100 UNIT/ML IV SOLN
500.0000 [IU] | Freq: Once | INTRAVENOUS | Status: AC
  Administered 2011-04-08: 500 [IU] via INTRAVENOUS
  Filled 2011-04-08: qty 5

## 2011-04-11 ENCOUNTER — Other Ambulatory Visit (HOSPITAL_BASED_OUTPATIENT_CLINIC_OR_DEPARTMENT_OTHER): Payer: 59 | Admitting: Lab

## 2011-04-11 ENCOUNTER — Ambulatory Visit (HOSPITAL_BASED_OUTPATIENT_CLINIC_OR_DEPARTMENT_OTHER): Payer: 59

## 2011-04-11 VITALS — BP 103/61 | HR 66 | Temp 98.3°F

## 2011-04-11 DIAGNOSIS — Z5111 Encounter for antineoplastic chemotherapy: Secondary | ICD-10-CM

## 2011-04-11 DIAGNOSIS — D392 Neoplasm of uncertain behavior of placenta: Secondary | ICD-10-CM

## 2011-04-11 LAB — COMPREHENSIVE METABOLIC PANEL
Albumin: 4.2 g/dL (ref 3.5–5.2)
Alkaline Phosphatase: 59 U/L (ref 39–117)
BUN: 7 mg/dL (ref 6–23)
CO2: 26 mEq/L (ref 19–32)
Calcium: 9.5 mg/dL (ref 8.4–10.5)
Chloride: 105 mEq/L (ref 96–112)
Glucose, Bld: 95 mg/dL (ref 70–99)
Potassium: 3.9 mEq/L (ref 3.5–5.3)
Sodium: 139 mEq/L (ref 135–145)
Total Protein: 6.9 g/dL (ref 6.0–8.3)

## 2011-04-11 LAB — CBC WITH DIFFERENTIAL/PLATELET
Basophils Absolute: 0 10*3/uL (ref 0.0–0.1)
EOS%: 0.2 % (ref 0.0–7.0)
Eosinophils Absolute: 0 10*3/uL (ref 0.0–0.5)
HCT: 36.1 % (ref 34.8–46.6)
HGB: 12.2 g/dL (ref 11.6–15.9)
MCH: 27.3 pg (ref 25.1–34.0)
NEUT#: 3 10*3/uL (ref 1.5–6.5)
NEUT%: 61.2 % (ref 38.4–76.8)
lymph#: 1.3 10*3/uL (ref 0.9–3.3)

## 2011-04-11 LAB — HCG, QUANTITATIVE, PREGNANCY: hCG, Beta Chain, Quant, S: 2.3 m[IU]/mL

## 2011-04-11 MED ORDER — SODIUM CHLORIDE 0.9 % IV SOLN
12.0000 ug/kg | Freq: Once | INTRAVENOUS | Status: AC
  Administered 2011-04-11: 900 ug via INTRAVENOUS
  Filled 2011-04-11: qty 1.8

## 2011-04-11 MED ORDER — HEPARIN SOD (PORK) LOCK FLUSH 100 UNIT/ML IV SOLN
250.0000 [IU] | Freq: Once | INTRAVENOUS | Status: AC | PRN
  Administered 2011-04-11: 250 [IU]
  Filled 2011-04-11: qty 5

## 2011-04-11 MED ORDER — SODIUM CHLORIDE 0.9 % IV SOLN
Freq: Once | INTRAVENOUS | Status: AC
  Administered 2011-04-11: 10:00:00 via INTRAVENOUS

## 2011-04-11 MED ORDER — ONDANSETRON 8 MG/50ML IVPB (CHCC)
8.0000 mg | Freq: Once | INTRAVENOUS | Status: AC
  Administered 2011-04-11: 8 mg via INTRAVENOUS

## 2011-04-11 MED ORDER — DEXAMETHASONE SODIUM PHOSPHATE 10 MG/ML IJ SOLN
10.0000 mg | Freq: Once | INTRAMUSCULAR | Status: AC
  Administered 2011-04-11: 10 mg via INTRAVENOUS

## 2011-04-11 MED ORDER — SODIUM CHLORIDE 0.9 % IJ SOLN
10.0000 mL | INTRAMUSCULAR | Status: DC | PRN
  Administered 2011-04-11: 10 mL
  Filled 2011-04-11: qty 10

## 2011-04-11 NOTE — Patient Instructions (Signed)
Patient aware of next appointment; discharged home with no complaints. 

## 2011-04-12 ENCOUNTER — Ambulatory Visit (HOSPITAL_BASED_OUTPATIENT_CLINIC_OR_DEPARTMENT_OTHER): Payer: 59

## 2011-04-12 ENCOUNTER — Telehealth: Payer: Self-pay

## 2011-04-12 VITALS — BP 102/70 | HR 78 | Temp 97.7°F

## 2011-04-12 DIAGNOSIS — D392 Neoplasm of uncertain behavior of placenta: Secondary | ICD-10-CM

## 2011-04-12 MED ORDER — ONDANSETRON 8 MG/50ML IVPB (CHCC)
8.0000 mg | Freq: Once | INTRAVENOUS | Status: AC
  Administered 2011-04-12: 8 mg via INTRAVENOUS

## 2011-04-12 MED ORDER — HEPARIN SOD (PORK) LOCK FLUSH 100 UNIT/ML IV SOLN
500.0000 [IU] | Freq: Once | INTRAVENOUS | Status: AC | PRN
  Administered 2011-04-12: 2.5 [IU]
  Filled 2011-04-12: qty 5

## 2011-04-12 MED ORDER — SODIUM CHLORIDE 0.9 % IV SOLN
Freq: Once | INTRAVENOUS | Status: AC
  Administered 2011-04-12: 10:00:00 via INTRAVENOUS

## 2011-04-12 MED ORDER — SODIUM CHLORIDE 0.9 % IV SOLN
12.0000 ug/kg | Freq: Once | INTRAVENOUS | Status: AC
  Administered 2011-04-12: 900 ug via INTRAVENOUS
  Filled 2011-04-12: qty 1.8

## 2011-04-12 MED ORDER — DEXAMETHASONE SODIUM PHOSPHATE 10 MG/ML IJ SOLN
10.0000 mg | Freq: Once | INTRAMUSCULAR | Status: AC
  Administered 2011-04-12: 10 mg via INTRAVENOUS

## 2011-04-12 MED ORDER — HEPARIN SOD (PORK) LOCK FLUSH 100 UNIT/ML IV SOLN
500.0000 [IU] | Freq: Once | INTRAVENOUS | Status: AC
  Administered 2011-04-12: 500 [IU] via INTRAVENOUS
  Filled 2011-04-12: qty 5

## 2011-04-12 MED ORDER — SODIUM CHLORIDE 0.9 % IJ SOLN
10.0000 mL | INTRAMUSCULAR | Status: DC | PRN
  Administered 2011-04-12: 10 mL via INTRAVENOUS
  Filled 2011-04-12: qty 10

## 2011-04-12 MED ORDER — SODIUM CHLORIDE 0.9 % IJ SOLN
10.0000 mL | INTRAMUSCULAR | Status: DC | PRN
  Administered 2011-04-12: 10 mL
  Filled 2011-04-12: qty 10

## 2011-04-12 NOTE — Telephone Encounter (Signed)
SPOKE WITH [name] AND TOLD HER THAT HER HCG LEVEL WAS 2.3 YESTERDAY.  SHE HAD QUESTIONS AS TO HOW MANY MORE TREATMENTS ETC.  SUGGESTED SHE CALL [name] WITH GYN ONC AND POSE THESE QUESTIONS TO HER SO SHE CAN REVIEW WITH DR. Duard Brady IF NECESSARY.  PT. VERBALIZED UNDERSTANDING.

## 2011-04-13 ENCOUNTER — Ambulatory Visit (HOSPITAL_BASED_OUTPATIENT_CLINIC_OR_DEPARTMENT_OTHER): Payer: 59

## 2011-04-13 ENCOUNTER — Encounter: Payer: Self-pay | Admitting: Oncology

## 2011-04-13 VITALS — BP 102/61 | HR 67 | Temp 97.1°F

## 2011-04-13 DIAGNOSIS — D392 Neoplasm of uncertain behavior of placenta: Secondary | ICD-10-CM

## 2011-04-13 DIAGNOSIS — Z5111 Encounter for antineoplastic chemotherapy: Secondary | ICD-10-CM

## 2011-04-13 MED ORDER — ONDANSETRON 8 MG/50ML IVPB (CHCC)
8.0000 mg | Freq: Once | INTRAVENOUS | Status: AC
  Administered 2011-04-13: 8 mg via INTRAVENOUS

## 2011-04-13 MED ORDER — DEXAMETHASONE SODIUM PHOSPHATE 10 MG/ML IJ SOLN
10.0000 mg | Freq: Once | INTRAMUSCULAR | Status: AC
  Administered 2011-04-13: 10 mg via INTRAVENOUS

## 2011-04-13 MED ORDER — SODIUM CHLORIDE 0.9 % IJ SOLN
10.0000 mL | INTRAMUSCULAR | Status: DC | PRN
  Administered 2011-04-13: 10 mL
  Filled 2011-04-13: qty 10

## 2011-04-13 MED ORDER — SODIUM CHLORIDE 0.9 % IV SOLN
Freq: Once | INTRAVENOUS | Status: AC
  Administered 2011-04-13: 09:00:00 via INTRAVENOUS

## 2011-04-13 MED ORDER — HEPARIN SOD (PORK) LOCK FLUSH 100 UNIT/ML IV SOLN
500.0000 [IU] | Freq: Once | INTRAVENOUS | Status: AC | PRN
  Administered 2011-04-13: 500 [IU]
  Filled 2011-04-13: qty 5

## 2011-04-13 MED ORDER — SODIUM CHLORIDE 0.9 % IV SOLN
12.0000 ug/kg | Freq: Once | INTRAVENOUS | Status: AC
  Administered 2011-04-13: 900 ug via INTRAVENOUS
  Filled 2011-04-13: qty 1.8

## 2011-04-13 NOTE — Progress Notes (Signed)
email to Dr.Gehrig re BHCG down to 2.3 on 04-11-11, cycle 3 actD in process this week. ? How many cycles to expect. ? Next apt to gyn onc.

## 2011-04-14 ENCOUNTER — Other Ambulatory Visit: Payer: Self-pay | Admitting: Oncology

## 2011-04-14 ENCOUNTER — Ambulatory Visit (HOSPITAL_BASED_OUTPATIENT_CLINIC_OR_DEPARTMENT_OTHER): Payer: 59

## 2011-04-14 ENCOUNTER — Encounter: Payer: Self-pay | Admitting: Oncology

## 2011-04-14 VITALS — BP 107/62 | HR 73 | Temp 97.5°F

## 2011-04-14 DIAGNOSIS — Z5111 Encounter for antineoplastic chemotherapy: Secondary | ICD-10-CM

## 2011-04-14 DIAGNOSIS — D392 Neoplasm of uncertain behavior of placenta: Secondary | ICD-10-CM

## 2011-04-14 MED ORDER — SODIUM CHLORIDE 0.9 % IV SOLN
Freq: Once | INTRAVENOUS | Status: AC
Start: 2011-04-14 — End: 2011-04-14
  Administered 2011-04-14: 09:00:00 via INTRAVENOUS

## 2011-04-14 MED ORDER — SODIUM CHLORIDE 0.9 % IV SOLN
12.0000 ug/kg | Freq: Once | INTRAVENOUS | Status: AC
  Administered 2011-04-14: 900 ug via INTRAVENOUS
  Filled 2011-04-14: qty 1.8

## 2011-04-14 MED ORDER — DEXAMETHASONE SODIUM PHOSPHATE 10 MG/ML IJ SOLN
10.0000 mg | Freq: Once | INTRAMUSCULAR | Status: AC
  Administered 2011-04-14: 10 mg via INTRAVENOUS

## 2011-04-14 MED ORDER — HEPARIN SOD (PORK) LOCK FLUSH 100 UNIT/ML IV SOLN
500.0000 [IU] | Freq: Once | INTRAVENOUS | Status: AC | PRN
  Administered 2011-04-14: 250 [IU]
  Filled 2011-04-14: qty 5

## 2011-04-14 MED ORDER — ONDANSETRON 8 MG/50ML IVPB (CHCC)
8.0000 mg | Freq: Once | INTRAVENOUS | Status: AC
  Administered 2011-04-14: 8 mg via INTRAVENOUS

## 2011-04-14 MED ORDER — SODIUM CHLORIDE 0.9 % IJ SOLN
10.0000 mL | INTRAMUSCULAR | Status: DC | PRN
  Administered 2011-04-14: 10 mL
  Filled 2011-04-14: qty 10

## 2011-04-14 NOTE — Patient Instructions (Signed)
Discussed benadryl or melatonin for better sleep. Told pt to ask Dr Welton Flakes before taking. Pt aware to return tomorrow.

## 2011-04-14 NOTE — Progress Notes (Signed)
Message from Dr.Gehrig that patient should be treated with 2 cycles beyond normalization of marker, which was normal as of 04-11-11. Patient is being treated this week and will be set up to be treated week of 3-25 thru 3-29. Dr.Gehrig will set up f/u apt to gyn onc. Scheduling request sent now and orders in for week of 3-25; RN in infusion aware and will let patient know.

## 2011-04-15 ENCOUNTER — Telehealth: Payer: Self-pay | Admitting: Oncology

## 2011-04-15 ENCOUNTER — Ambulatory Visit (HOSPITAL_BASED_OUTPATIENT_CLINIC_OR_DEPARTMENT_OTHER): Payer: 59

## 2011-04-15 VITALS — BP 98/67 | HR 56 | Temp 97.2°F

## 2011-04-15 DIAGNOSIS — D392 Neoplasm of uncertain behavior of placenta: Secondary | ICD-10-CM

## 2011-04-15 DIAGNOSIS — Z5111 Encounter for antineoplastic chemotherapy: Secondary | ICD-10-CM

## 2011-04-15 MED ORDER — SODIUM CHLORIDE 0.9 % IV SOLN
Freq: Once | INTRAVENOUS | Status: AC
  Administered 2011-04-15: 10:00:00 via INTRAVENOUS

## 2011-04-15 MED ORDER — DEXAMETHASONE SODIUM PHOSPHATE 10 MG/ML IJ SOLN
10.0000 mg | Freq: Once | INTRAMUSCULAR | Status: AC
  Administered 2011-04-15: 10 mg via INTRAVENOUS

## 2011-04-15 MED ORDER — HEPARIN SOD (PORK) LOCK FLUSH 100 UNIT/ML IV SOLN
500.0000 [IU] | Freq: Once | INTRAVENOUS | Status: DC | PRN
  Filled 2011-04-15: qty 5

## 2011-04-15 MED ORDER — SODIUM CHLORIDE 0.9 % IJ SOLN
10.0000 mL | INTRAMUSCULAR | Status: DC | PRN
  Administered 2011-04-15: 10 mL
  Filled 2011-04-15: qty 10

## 2011-04-15 MED ORDER — ONDANSETRON 8 MG/50ML IVPB (CHCC)
8.0000 mg | Freq: Once | INTRAVENOUS | Status: AC
  Administered 2011-04-15: 8 mg via INTRAVENOUS

## 2011-04-15 MED ORDER — SODIUM CHLORIDE 0.9 % IV SOLN
12.0000 ug/kg | Freq: Once | INTRAVENOUS | Status: AC
  Administered 2011-04-15: 900 ug via INTRAVENOUS
  Filled 2011-04-15: qty 1.8

## 2011-04-15 MED ORDER — HEPARIN SOD (PORK) LOCK FLUSH 100 UNIT/ML IV SOLN
250.0000 [IU] | Freq: Once | INTRAVENOUS | Status: AC | PRN
Start: 2011-04-15 — End: 2011-04-15
  Administered 2011-04-15: 250 [IU]
  Filled 2011-04-15: qty 5

## 2011-04-15 NOTE — Telephone Encounter (Signed)
Talked to pt gave her appt for 3/18, and she will come to get calendar appt on Monday.

## 2011-04-18 ENCOUNTER — Ambulatory Visit (HOSPITAL_BASED_OUTPATIENT_CLINIC_OR_DEPARTMENT_OTHER): Payer: 59

## 2011-04-18 ENCOUNTER — Other Ambulatory Visit (HOSPITAL_BASED_OUTPATIENT_CLINIC_OR_DEPARTMENT_OTHER): Payer: 59 | Admitting: Lab

## 2011-04-18 VITALS — BP 117/61 | HR 74 | Temp 98.6°F

## 2011-04-18 DIAGNOSIS — O019 Hydatidiform mole, unspecified: Secondary | ICD-10-CM

## 2011-04-18 DIAGNOSIS — Z452 Encounter for adjustment and management of vascular access device: Secondary | ICD-10-CM

## 2011-04-18 DIAGNOSIS — D392 Neoplasm of uncertain behavior of placenta: Secondary | ICD-10-CM

## 2011-04-18 LAB — CBC WITH DIFFERENTIAL/PLATELET
BASO%: 0.8 % (ref 0.0–2.0)
EOS%: 0.1 % (ref 0.0–7.0)
HGB: 12.7 g/dL (ref 11.6–15.9)
MCH: 28.7 pg (ref 25.1–34.0)
MCV: 83.6 fL (ref 79.5–101.0)
MONO%: 4.2 % (ref 0.0–14.0)
RBC: 4.44 10*6/uL (ref 3.70–5.45)
RDW: 16.3 % — ABNORMAL HIGH (ref 11.2–14.5)
lymph#: 1.4 10*3/uL (ref 0.9–3.3)

## 2011-04-18 LAB — COMPREHENSIVE METABOLIC PANEL
ALT: 12 U/L (ref 0–35)
AST: 16 U/L (ref 0–37)
Albumin: 4 g/dL (ref 3.5–5.2)
Alkaline Phosphatase: 44 U/L (ref 39–117)
Calcium: 9.1 mg/dL (ref 8.4–10.5)
Chloride: 104 mEq/L (ref 96–112)
Potassium: 4.1 mEq/L (ref 3.5–5.3)
Sodium: 139 mEq/L (ref 135–145)
Total Protein: 6.4 g/dL (ref 6.0–8.3)

## 2011-04-18 LAB — HCG, QUANTITATIVE, PREGNANCY: hCG, Beta Chain, Quant, S: <2 m[IU]/mL

## 2011-04-18 MED ORDER — HEPARIN SOD (PORK) LOCK FLUSH 100 UNIT/ML IV SOLN
500.0000 [IU] | Freq: Once | INTRAVENOUS | Status: AC
  Administered 2011-04-18: 500 [IU] via INTRAVENOUS
  Filled 2011-04-18: qty 5

## 2011-04-18 MED ORDER — SODIUM CHLORIDE 0.9 % IJ SOLN
10.0000 mL | INTRAMUSCULAR | Status: DC | PRN
  Administered 2011-04-18: 10 mL via INTRAVENOUS
  Filled 2011-04-18: qty 10

## 2011-04-19 ENCOUNTER — Other Ambulatory Visit: Payer: Self-pay

## 2011-04-19 DIAGNOSIS — D392 Neoplasm of uncertain behavior of placenta: Secondary | ICD-10-CM

## 2011-04-19 MED ORDER — SUCRALFATE 1 GM/10ML PO SUSP: 1.0000 g | Freq: Four times a day (QID) | ORAL | Status: DC

## 2011-04-19 MED ORDER — PANTOPRAZOLE SODIUM 40 MG PO TBEC: DELAYED_RELEASE_TABLET | ORAL | Status: DC

## 2011-04-19 NOTE — Progress Notes (Signed)
SPOKE WITH [name] AN TOLD HER THAT SHE IS PROBABLY RAW ALONG THE GI TRACT FROM THE CHEMOTHERAPY.  DR. Darrold Span GAVE HER A PRESCRIPTION FOR PROTONIX FOR THE ACID REFLUX.  SHE IS TO START WITH 40 MG BID X 7 DAYS THEN DAILY. CARAFATE SLURRY WAS ORDERED TO HELP COAT HER THROAT. FOR THE RECTAL BLEEDING WITH BM THAT IS OCCURING SINCE 04-17-11.  DR. Darrold Span SAID TO TAKE A SITZ BATH QID, USE BABY WIPES, TUCKS, AND APPLY DESATIN  OINT. AROUND THE RECTAL AREA.   WILL REPEAT CBC PRIOR TO VISIT WITH PA-C ON 04-22-11. CBC LOOKED GOOD FROM 04-18-11. IF SHE HAS ANY PROLONGED RECTAL BLEEDING, SHE NEEDS TO GO TO THE [facility] ED.  PT. VERBALIZED UNDERSTANDING. THE HCG LEVEL WAS < 2.0 YESTERDAY WHICH MADE HER HAPPY. PRESCRIPTIONS SENT TO [facility] AS REQUESTED.

## 2011-04-20 ENCOUNTER — Other Ambulatory Visit: Payer: Self-pay | Admitting: Emergency Medicine

## 2011-04-22 ENCOUNTER — Other Ambulatory Visit (HOSPITAL_BASED_OUTPATIENT_CLINIC_OR_DEPARTMENT_OTHER): Payer: 59

## 2011-04-22 ENCOUNTER — Ambulatory Visit (HOSPITAL_BASED_OUTPATIENT_CLINIC_OR_DEPARTMENT_OTHER): Payer: 59 | Admitting: Physician Assistant

## 2011-04-22 ENCOUNTER — Telehealth: Payer: Self-pay | Admitting: Oncology

## 2011-04-22 ENCOUNTER — Encounter: Payer: Self-pay | Admitting: Physician Assistant

## 2011-04-22 ENCOUNTER — Ambulatory Visit: Payer: 59

## 2011-04-22 VITALS — BP 102/65 | HR 64 | Temp 98.5°F | Ht 67.0 in | Wt 172.2 lb

## 2011-04-22 VITALS — BP 104/72 | HR 67 | Temp 99.2°F

## 2011-04-22 DIAGNOSIS — O019 Hydatidiform mole, unspecified: Secondary | ICD-10-CM

## 2011-04-22 DIAGNOSIS — D392 Neoplasm of uncertain behavior of placenta: Secondary | ICD-10-CM

## 2011-04-22 DIAGNOSIS — Z90710 Acquired absence of both cervix and uterus: Secondary | ICD-10-CM

## 2011-04-22 LAB — CBC WITH DIFFERENTIAL/PLATELET
BASO%: 0.9 % (ref 0.0–2.0)
Eosinophils Absolute: 0 10*3/uL (ref 0.0–0.5)
LYMPH%: 15.8 % (ref 14.0–49.7)
MCHC: 34.6 g/dL (ref 31.5–36.0)
MONO#: 0.2 10*3/uL (ref 0.1–0.9)
NEUT#: 6.2 10*3/uL (ref 1.5–6.5)
Platelets: 135 10*3/uL — ABNORMAL LOW (ref 145–400)
RBC: 3.81 10*6/uL (ref 3.70–5.45)
WBC: 7.7 10*3/uL (ref 3.9–10.3)
lymph#: 1.2 10*3/uL (ref 0.9–3.3)

## 2011-04-22 MED ORDER — HEPARIN SOD (PORK) LOCK FLUSH 100 UNIT/ML IV SOLN
500.0000 [IU] | Freq: Once | INTRAVENOUS | Status: AC
  Administered 2011-04-22: 250 [IU] via INTRAVENOUS
  Filled 2011-04-22: qty 5

## 2011-04-22 MED ORDER — ACYCLOVIR 200 MG PO CAPS: 200.0000 mg | ORAL_CAPSULE | Freq: Three times a day (TID) | ORAL | Status: AC

## 2011-04-22 MED ORDER — SODIUM CHLORIDE 0.9 % IJ SOLN
10.0000 mL | INTRAMUSCULAR | Status: DC | PRN
  Administered 2011-04-22: 10 mL via INTRAVENOUS
  Filled 2011-04-22: qty 10

## 2011-04-22 NOTE — Telephone Encounter (Signed)
Gv pt schedule for [phone]

## 2011-04-22 NOTE — Patient Instructions (Signed)
Call MD for problems 

## 2011-04-23 ENCOUNTER — Other Ambulatory Visit: Payer: Self-pay | Admitting: Oncology

## 2011-04-24 NOTE — Progress Notes (Signed)
OFFICE PROGRESS NOTE Date of Visit 04-01-2011 Physicians; P.Gehrig, T.Henley  INTERVAL HISTORY:  Patient is seen, alone for most of visit and accompanied by RN friend at end of visit, in continuing attention to her recently progressive gestational trophoblastic disease, to complete second 5 day cycle of actinomycin today.   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 Per Dr.Gehrig's subsequent consult note, WHO score was 7 at diagnosis. B HCGs were followed closely but did not normalize and she was begun on methotrexate in August 2012. She had CT head+ CAP at [facility] in August. The marker progressively dropped until mid October, when treatment was changed to bolus actinomycinD at 1.25 mg/m2, single treatment given at [facility] on 11-22-10. Repeat CT Head +CAP was done at [facility]12 with stable 3.2 cm simple cyst in lateral left hepatic lobe and stable small low density lesion in central liver, with enlarging hypervascular mass in endometrial canal involving right wall of uterus, no evidence of metastatic disease. 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 She was given one pre-op dose of MTX day of surgery. B HCG dropped from 5919 to 6.4 by 02-16-11, then plateau'd (serial dilutions done and confirmed HCG). She had CT CAP on 03-15-11, with chest unremarkable, stable left hepatic cyst, no pelvic adenopathy, ovaries not remarkable with corpus luteal cyst, no CT evidence of metastatic disease. On 03-14-11 (= day 1 cycle 1 of five day actinomycin D) HCG was 11.9; HCG 03-21-11 was 21.1. Patient was seen in follow up by Dr.Gehrig on 03-24-11, with recommendation that actinomycin continue depending on subsequent weekly markers. If this is not effective, we expect care will continue with Dr.John Soper's  assistance at [facility]. She had first cycle of 5 day actinomycin D 2-11 thru 03-18-2011, and second cycle in progress 2-25 thru 04-01-2011. She has PICC RUE. ANC was 2.4 and platelets were 174k on day 1 cycle 2 (platelets 96k on day 12 cycle 1).  BHCG was down to 5.8 on 03-28-2011, this 11.9 on 03-14-11 (and up to 21.1 on 03-21-2011). BHCG is to be checked weekly; number of actinomycin D treatments will be per gyn oncology.  [name] has tolerated treatments this week generally well. Oral ulcers with first cycle improved quickly with triamcinolone dental paste. The ulcers were a bit worse after this last cycle. She has not had any further facial rash. She's had no significant problems with nausea since her last cycle. She is status post 3 cycles. For the acid reflux she is currently on Protonix and Carafate. She states that she averages taking the Carafate about 3 times a day and is beginning to see some improvement in her symptoms. She's not sleeping well and is wondering whether it's related to the steroids and she is given with her chemotherapy. She's also noticed some changes to her mood lately and is wondering if this is related to the steroids as well. Her bowels are moving regularly now and she's had no further bloody stools. She does have a history of hemorrhoids. She's been using preparation H. cream and sitz bath and tucks pads. She has had no problems with PICC, which she can have flushed by RN at home on Wednesdays of the off week (flush with weekly labs on Mon and dressing changes + flush Fri at [facility]). She is having no bleeding, no abdominal  or pelvic discomfort, no respiratory symptoms, no neurologic symptoms, no swelling LE, no fever or symptoms of infection. Remainder of 10 point Review of Systems negative. Objective:  Vital signs in last 24 hours:  BP 102/65  Pulse 64  Temp(Src) 98.5 F (36.9 C) (Oral)  Ht 5\' 7"  (1.702 m)  Wt 172 lb 3.2 oz (78.109 kg)  BMI 26.97 kg/m2  LMP 05/31/2010   Breastfeeding? No  Alert, easily mobile, very pleasant as always, NAD.   HEENT:mucous membranes moist, pharynx normal without lesions.  aphthous ulcers seen including posterior pharynx and beneath tongue. LymphaticsCervical, supraclavicular, and axillary nodes normal. Resp: clear to auscultation bilaterally and normal percussion bilaterally Cardio: regular rate and rhythm GI: soft, non-tender; bowel sounds normal; no masses,  no organomegaly Extremities: extremities normal, atraumatic, no cyanosis or edema PICC-without erythema or tenderness. Skin: Without rash or evidence of infection . Lab Results: CBC 2-25 as above, hgb 11.8  Basename 04/22/11 0933  WBC 7.7  HGB 10.9*  HCT 31.7*  PLT 135*    BMET No results found for this basename: NA:2,K:2,CL:2,CO2:2,GLUCOSE:2,BUN:2,CREATININE:2,CALCIUM:2 in the last 72 hours CMET also 03-28-2011 entirely normal  B HCG results as above Studies/Results:  No results found.  Medications: I have reviewed the patient's current medications.  Assessment/Plan:  1.Gestational trophoblastic disease: post treatment as above including hysterectomy, with recent rise in marker and now on actinomycin daily x 5 q 2 weeks. She will continue weekly labs (+PICC care) and will be set up for cycle 3 3-11 thru 04-15-2011 as long as ANC >= approximately 1.5 and plt >= approximately 100k. Patient was discussed with Dr. Darrold Span. We'll have her continue the preparation H., sitz baths and Tucks pads as needed for her hemorrhoids. She is to let us know she has any further blood per rectum. For her mouth ulcers we have asked her to switch her mouth with a mixture of baking soda salt and warm water. She may also use Biotene products. We'll also place her on acyclovir 200 mg by mouth 3 times a day for the next 10 days. She is to followup as previously scheduled with Dr. Darrold Span on 04/26/2011 and with Dr. Duard Brady on 05/19/2011. She has one more cycle of the actinomycin D  scheduled to begin 04/25/2011.  2.PICC in place  Patient was in agreement with plan as above.  Conni Slipper, PA-C     Coats, Suetta Hoffmeister E, PA-C   04/24/2011, 12:35 AM

## 2011-04-25 ENCOUNTER — Other Ambulatory Visit: Payer: 59 | Admitting: Lab

## 2011-04-25 ENCOUNTER — Ambulatory Visit (HOSPITAL_BASED_OUTPATIENT_CLINIC_OR_DEPARTMENT_OTHER): Payer: 59

## 2011-04-25 VITALS — BP 109/73 | HR 65 | Temp 98.4°F

## 2011-04-25 DIAGNOSIS — Z5111 Encounter for antineoplastic chemotherapy: Secondary | ICD-10-CM

## 2011-04-25 DIAGNOSIS — D392 Neoplasm of uncertain behavior of placenta: Secondary | ICD-10-CM

## 2011-04-25 LAB — CBC WITH DIFFERENTIAL/PLATELET
Basophils Absolute: 0.1 10*3/uL (ref 0.0–0.1)
EOS%: 0.2 % (ref 0.0–7.0)
Eosinophils Absolute: 0 10*3/uL (ref 0.0–0.5)
HGB: 11.4 g/dL — ABNORMAL LOW (ref 11.6–15.9)
MCV: 82.2 fL (ref 79.5–101.0)
MONO%: 9.6 % (ref 0.0–14.0)
NEUT#: 4.7 10*3/uL (ref 1.5–6.5)
RBC: 4.09 10*6/uL (ref 3.70–5.45)
RDW: 15.1 % — ABNORMAL HIGH (ref 11.2–14.5)
lymph#: 1 10*3/uL (ref 0.9–3.3)
nRBC: 0 % (ref 0–0)

## 2011-04-25 LAB — COMPREHENSIVE METABOLIC PANEL
ALT: 16 U/L (ref 0–35)
Albumin: 4.3 g/dL (ref 3.5–5.2)
Alkaline Phosphatase: 50 U/L (ref 39–117)
Glucose, Bld: 98 mg/dL (ref 70–99)
Potassium: 3.9 mEq/L (ref 3.5–5.3)
Sodium: 140 mEq/L (ref 135–145)
Total Bilirubin: 0.3 mg/dL (ref 0.3–1.2)
Total Protein: 6.6 g/dL (ref 6.0–8.3)

## 2011-04-25 LAB — HCG, QUANTITATIVE, PREGNANCY: hCG, Beta Chain, Quant, S: <2 m[IU]/mL

## 2011-04-25 MED ORDER — HEPARIN SOD (PORK) LOCK FLUSH 100 UNIT/ML IV SOLN
500.0000 [IU] | Freq: Once | INTRAVENOUS | Status: AC | PRN
  Administered 2011-04-25: 500 [IU]
  Filled 2011-04-25: qty 5

## 2011-04-25 MED ORDER — SODIUM CHLORIDE 0.9 % IV SOLN
12.0000 ug/kg | Freq: Once | INTRAVENOUS | Status: AC
  Administered 2011-04-25: 900 ug via INTRAVENOUS
  Filled 2011-04-25: qty 1.8

## 2011-04-25 MED ORDER — ONDANSETRON 8 MG/50ML IVPB (CHCC)
8.0000 mg | Freq: Once | INTRAVENOUS | Status: AC
  Administered 2011-04-25: 8 mg via INTRAVENOUS

## 2011-04-25 MED ORDER — ALTEPLASE 2 MG IJ SOLR
2.0000 mg | Freq: Once | INTRAMUSCULAR | Status: DC | PRN
  Filled 2011-04-25: qty 2

## 2011-04-25 MED ORDER — LORAZEPAM 1 MG PO TABS: 0.5000 mg | ORAL_TABLET | Freq: Every day | ORAL | Status: DC | PRN

## 2011-04-25 MED ORDER — SODIUM CHLORIDE 0.9 % IV SOLN
Freq: Once | INTRAVENOUS | Status: AC
  Administered 2011-04-25: 10:00:00 via INTRAVENOUS

## 2011-04-25 MED ORDER — SODIUM CHLORIDE 0.9 % IJ SOLN
10.0000 mL | INTRAMUSCULAR | Status: DC | PRN
  Administered 2011-04-25: 10 mL
  Filled 2011-04-25: qty 10

## 2011-04-26 ENCOUNTER — Encounter: Payer: Self-pay | Admitting: Oncology

## 2011-04-26 ENCOUNTER — Other Ambulatory Visit: Payer: 59 | Admitting: Lab

## 2011-04-26 ENCOUNTER — Ambulatory Visit (HOSPITAL_BASED_OUTPATIENT_CLINIC_OR_DEPARTMENT_OTHER): Payer: 59

## 2011-04-26 ENCOUNTER — Ambulatory Visit: Payer: 59 | Admitting: Oncology

## 2011-04-26 VITALS — BP 95/59 | HR 62 | Temp 98.6°F | Ht 67.0 in | Wt 171.1 lb

## 2011-04-26 DIAGNOSIS — Z5111 Encounter for antineoplastic chemotherapy: Secondary | ICD-10-CM

## 2011-04-26 DIAGNOSIS — D392 Neoplasm of uncertain behavior of placenta: Secondary | ICD-10-CM

## 2011-04-26 MED ORDER — ONDANSETRON 8 MG/50ML IVPB (CHCC)
8.0000 mg | Freq: Once | INTRAVENOUS | Status: AC
  Administered 2011-04-26: 8 mg via INTRAVENOUS

## 2011-04-26 MED ORDER — SODIUM CHLORIDE 0.9 % IV SOLN
Freq: Once | INTRAVENOUS | Status: AC
  Administered 2011-04-26: 14:00:00 via INTRAVENOUS

## 2011-04-26 MED ORDER — COLD PACK MISC ONCOLOGY
1.0000 | Freq: Once | Status: DC | PRN
  Filled 2011-04-26: qty 1

## 2011-04-26 MED ORDER — SODIUM CHLORIDE 0.9 % IV SOLN
12.0000 ug/kg | Freq: Once | INTRAVENOUS | Status: AC
  Administered 2011-04-26: 900 ug via INTRAVENOUS
  Filled 2011-04-26: qty 1.8

## 2011-04-26 MED ORDER — HEPARIN SOD (PORK) LOCK FLUSH 100 UNIT/ML IV SOLN
250.0000 [IU] | Freq: Once | INTRAVENOUS | Status: AC | PRN
  Administered 2011-04-26: 250 [IU]
  Filled 2011-04-26: qty 5

## 2011-04-26 MED ORDER — SODIUM CHLORIDE 0.9 % IJ SOLN
10.0000 mL | INTRAMUSCULAR | Status: DC | PRN
  Administered 2011-04-26: 10 mL
  Filled 2011-04-26: qty 10

## 2011-04-26 NOTE — Progress Notes (Signed)
OFFICE PROGRESS NOTE Date of Visit 04-26-2011 Physicians: P.Gehrig, T.Henley  INTERVAL HISTORY:  Patient is seen, alone for visit today, day 2 cycle 4 actinomycin D for gestational trophoblastic disease.  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 Per Dr.Gehrig's subsequent consult note, WHO score was 7 at diagnosis. B HCGs were followed closely but did not normalize and she was begun on methotrexate in August 2012. She had CT head+ CAP at [facility] in August. The marker progressively dropped until mid October, when treatment was changed to bolus actinomycinD at 1.25 mg/m2, single treatment given at [facility] on 11-22-10. Repeat CT Head +CAP was done at [facility] with stable 3.2 cm simple cyst in lateral left hepatic lobe and stable small low density lesion in central liver, with enlarging hypervascular mass in endometrial canal involving right wall of uterus, no evidence of metastatic disease. 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 She was given one pre-op dose of MTX day of surgery. B HCG dropped from 5919 to 6.4 by 02-16-11, then plateau'd (serial dilutions done and confirmed HCG). She had CT CAP on 03-15-11, with chest unremarkable, stable left hepatic cyst, no pelvic adenopathy, ovaries not remarkable with corpus luteal cyst, no CT evidence of metastatic disease. She was begun on actinomycin D 12 mcg/kg/day x5 q 2 weeks on 03-13-2011, with BHCG 11.9 on 03-14-11 and 21.1 on 03-21-11, but subsequently down to normal range (2.3) by 04-11-11. Present cycle 4, which began 04-25-11, is thus the second full cycle beyond normalization of marker and we expect to go back to close observation after treatment this week is completed. She is to see Dr.Gehrig again 05-19-2011.  Patient has required PICC  line for treatment, which has functioned well. She is a little reluctant to have this removed immediately, although her veins are good for blood draws and I do not expect we will need it longer than ~ a week after treatment at most (could DC after treatment 3-29 if she agrees). She has done well with treatment so far this week, tho tends to have more problems with nausea and oral ulcers for a couple of days after the 5 days complete. She has had a few episodes of BRB with bowel movements, does have history of hemorrhoids. I have recommended sitz baths ~ 2x daily and she can use Tucks or Prep H prn. She is having no other bleeding. She has had some intermittent constipation thru treatment. She is losing more hair. She has had no other bleeding and no symptoms of infection or fever. She denies shortness of breath or any pain.  Full 10 point Review of Systems otherwise negative/ unchanged.   Objective:  Vital signs in last 24 hours:  BP 95/59  Pulse 62  Temp(Src) 98.6 F (37 C) (Oral)  Ht 5\' 7"  (1.702 m)  Wt 171 lb 1.6 oz (77.61 kg)  BMI 26.80 kg/m2  LMP 05/31/2010  Breastfeeding? No Weight is down 1 lb. Alert, easily ambulatory, looks comfortable, very pleasant as always. No obvious alopecia as her hair is quite thick. HEENT:mucous membranes moist, pharynx normal without lesions. PERRL.  LymphaticsCervical, supraclavicular, and axillary nodes normal. Resp: clear to auscultation bilaterally and normal percussion bilaterally Cardio: regular rate and rhythm GI: soft, non-tender; bowel sounds normal; no masses,  no organomegaly. Perirectal area not remarkable, not tender, no  external hemorrhoids Extremities: extremities normal, atraumatic, no cyanosis or edema Neuro:nonfocal PICC-without erythema or tenderness RUE. Lab Results:   [facility] 04/25/11 0932  WBC 6.3  HGB 11.4*  HCT 33.6*  PLT 154  ANC 4.7  BMET  Basename 04/25/11 0933  NA 140  K 3.9  CL 109  CO2 25  GLUCOSE 98  BUN  11  CREATININE 0.57  CALCIUM 9.2   Remainder of full CMET normal  HCG also 04-25-11 <2 Studies/Results:  No results found.  Medications: I have reviewed the patient's current medications.  Assessment/Plan: 1.Gestational trophoblastic disease: history as above. Present cycle 4 of 5 day actinomycin D will be second cycle since normalization of marker. She will have weekly HCG initially as we continue to follow off treatment after completion of cycle 4. She will see gyn onc on 05-19-11; I have set up return visit to me in May, tho certainly I can move this if needed 2.PICC line in: anticipate having this removed shortly after chemotherapy completes. 3.post hysterectomy 12-2010  Patient was in agreement with plan as above Kaijah Abts P, MD   04/26/2011, 4:03 PM

## 2011-04-27 ENCOUNTER — Ambulatory Visit (HOSPITAL_BASED_OUTPATIENT_CLINIC_OR_DEPARTMENT_OTHER): Payer: 59

## 2011-04-27 VITALS — BP 100/66 | HR 68 | Temp 98.3°F

## 2011-04-27 DIAGNOSIS — D392 Neoplasm of uncertain behavior of placenta: Secondary | ICD-10-CM

## 2011-04-27 DIAGNOSIS — Z5111 Encounter for antineoplastic chemotherapy: Secondary | ICD-10-CM

## 2011-04-27 MED ORDER — SODIUM CHLORIDE 0.9 % IV SOLN
Freq: Once | INTRAVENOUS | Status: AC
  Administered 2011-04-27: 10:00:00 via INTRAVENOUS

## 2011-04-27 MED ORDER — ONDANSETRON 8 MG/50ML IVPB (CHCC)
8.0000 mg | Freq: Once | INTRAVENOUS | Status: AC
  Administered 2011-04-27: 8 mg via INTRAVENOUS

## 2011-04-27 MED ORDER — HEPARIN SOD (PORK) LOCK FLUSH 100 UNIT/ML IV SOLN
500.0000 [IU] | Freq: Once | INTRAVENOUS | Status: AC | PRN
  Administered 2011-04-27: 500 [IU]
  Filled 2011-04-27: qty 5

## 2011-04-27 MED ORDER — SODIUM CHLORIDE 0.9 % IV SOLN
12.0000 ug/kg | Freq: Once | INTRAVENOUS | Status: AC
  Administered 2011-04-27: 900 ug via INTRAVENOUS
  Filled 2011-04-27: qty 1.8

## 2011-04-27 MED ORDER — SODIUM CHLORIDE 0.9 % IJ SOLN
10.0000 mL | INTRAMUSCULAR | Status: DC | PRN
  Administered 2011-04-27: 10 mL
  Filled 2011-04-27: qty 10

## 2011-04-27 NOTE — Patient Instructions (Signed)
[facility] Discharge Instructions for Patients Receiving Chemotherapy  Today you received the following chemotherapy agents [place].  To help prevent nausea and vomiting after your treatment, we encourage you to take your nausea medication as prescribed by your physician.  If you develop nausea and vomiting that is not controlled by your nausea medication, call the clinic. If it is after clinic hours your family physician or the after hours number for the clinic or go to the Emergency Department.   BELOW ARE SYMPTOMS THAT SHOULD BE REPORTED IMMEDIATELY:  *FEVER GREATER THAN 100.5 F  *CHILLS WITH OR WITHOUT FEVER  NAUSEA AND VOMITING THAT IS NOT CONTROLLED WITH YOUR NAUSEA MEDICATION  *UNUSUAL SHORTNESS OF BREATH  *UNUSUAL BRUISING OR BLEEDING  TENDERNESS IN MOUTH AND THROAT WITH OR WITHOUT PRESENCE OF ULCERS  *URINARY PROBLEMS  *BOWEL PROBLEMS  UNUSUAL RASH Items with * indicate a potential emergency and should be followed up as soon as possible.  Feel free to call the clinic you have any questions or concerns. The clinic phone number is [phone].   I have been informed and understand all the instructions given to me. I know to contact the clinic, my physician, or go to the Emergency Department if any problems should occur. I do not have any questions at this time, but understand that I may call the clinic during office hours   should I have any questions or need assistance in obtaining follow up care.    __________________________________________  _____________  __________ Signature of Patient or Authorized Representative            Date                   Time    __________________________________________ Nurse's Signature

## 2011-04-28 ENCOUNTER — Ambulatory Visit (HOSPITAL_BASED_OUTPATIENT_CLINIC_OR_DEPARTMENT_OTHER): Payer: 59

## 2011-04-28 VITALS — BP 101/66 | HR 74 | Temp 98.6°F

## 2011-04-28 DIAGNOSIS — Z5111 Encounter for antineoplastic chemotherapy: Secondary | ICD-10-CM

## 2011-04-28 DIAGNOSIS — D392 Neoplasm of uncertain behavior of placenta: Secondary | ICD-10-CM

## 2011-04-28 MED ORDER — ONDANSETRON 8 MG/50ML IVPB (CHCC)
8.0000 mg | Freq: Once | INTRAVENOUS | Status: AC
  Administered 2011-04-28: 8 mg via INTRAVENOUS

## 2011-04-28 MED ORDER — SODIUM CHLORIDE 0.9 % IV SOLN
12.0000 ug/kg | Freq: Once | INTRAVENOUS | Status: AC
  Administered 2011-04-28: 900 ug via INTRAVENOUS
  Filled 2011-04-28: qty 1.8

## 2011-04-28 MED ORDER — LORAZEPAM 1 MG PO TABS: 0.5000 mg | ORAL_TABLET | Freq: Every day | ORAL | Status: DC | PRN

## 2011-04-28 MED ORDER — SODIUM CHLORIDE 0.9 % IV SOLN
Freq: Once | INTRAVENOUS | Status: AC
  Administered 2011-04-28: 08:00:00 via INTRAVENOUS

## 2011-04-29 ENCOUNTER — Ambulatory Visit (HOSPITAL_BASED_OUTPATIENT_CLINIC_OR_DEPARTMENT_OTHER): Payer: 59

## 2011-04-29 VITALS — BP 94/62 | HR 69

## 2011-04-29 DIAGNOSIS — D392 Neoplasm of uncertain behavior of placenta: Secondary | ICD-10-CM

## 2011-04-29 DIAGNOSIS — Z5111 Encounter for antineoplastic chemotherapy: Secondary | ICD-10-CM

## 2011-04-29 MED ORDER — SODIUM CHLORIDE 0.9 % IV SOLN
Freq: Once | INTRAVENOUS | Status: AC
  Administered 2011-04-29: 08:00:00 via INTRAVENOUS

## 2011-04-29 MED ORDER — ONDANSETRON 8 MG/50ML IVPB (CHCC)
8.0000 mg | Freq: Once | INTRAVENOUS | Status: AC
  Administered 2011-04-29: 8 mg via INTRAVENOUS

## 2011-04-29 MED ORDER — SODIUM CHLORIDE 0.9 % IV SOLN
12.0000 ug/kg | Freq: Once | INTRAVENOUS | Status: AC
  Administered 2011-04-29: 900 ug via INTRAVENOUS
  Filled 2011-04-29: qty 1.8

## 2011-05-02 ENCOUNTER — Other Ambulatory Visit: Payer: 59 | Admitting: Lab

## 2011-05-02 DIAGNOSIS — D392 Neoplasm of uncertain behavior of placenta: Secondary | ICD-10-CM

## 2011-05-06 ENCOUNTER — Telehealth: Payer: Self-pay

## 2011-05-06 NOTE — Telephone Encounter (Addendum)
MS. Kroh CALLED TO SEE IF DR. Darrold Span COULD WRITE  A NOTE TO RETURN TO WORK ON 05-12-11 OR DOES SHE NEED TO WAIT FOR LAB RESULTS OF QUANTITATIVE  HCG ON 05-09-11? YES PER DR. Darrold Span.

## 2011-05-09 ENCOUNTER — Other Ambulatory Visit (HOSPITAL_BASED_OUTPATIENT_CLINIC_OR_DEPARTMENT_OTHER): Payer: 59 | Admitting: Lab

## 2011-05-09 ENCOUNTER — Telehealth: Payer: Self-pay

## 2011-05-09 DIAGNOSIS — D392 Neoplasm of uncertain behavior of placenta: Secondary | ICD-10-CM

## 2011-05-09 LAB — HCG, QUANTITATIVE, PREGNANCY: hCG, Beta Chain, Quant, S: <2 m[IU]/mL

## 2011-05-09 NOTE — Telephone Encounter (Signed)
SPOKE WITH [name]  AND TOLD HER THAT HER QUANITATIVE HCG LEVEL WAS <2.0 TODAY SO SHE MAY RETURN TO WORK ON 05-12-11 PER DR. Darrold Span.  TOLD HER THAT THE NOTE  IS IN THE PRESCRIPTION BOOK AND SHE CAN PICK IT UP ANY TIME BETWEEN 9 AM AND 4 PM.

## 2011-05-16 ENCOUNTER — Other Ambulatory Visit (HOSPITAL_BASED_OUTPATIENT_CLINIC_OR_DEPARTMENT_OTHER): Payer: 59 | Admitting: Lab

## 2011-05-16 DIAGNOSIS — D392 Neoplasm of uncertain behavior of placenta: Secondary | ICD-10-CM

## 2011-05-17 ENCOUNTER — Telehealth: Payer: Self-pay

## 2011-05-17 NOTE — Telephone Encounter (Signed)
LM FOR MS. Capote  THAT HER  HCG LEVEL WAS GOOD AT <2.0.  KEEP HER APPT. ON 05-19-11 WITH DR. Duard Brady AS SCHEDULED AT 0930.

## 2011-05-19 ENCOUNTER — Ambulatory Visit: Payer: 59 | Attending: Gynecologic Oncology | Admitting: Gynecologic Oncology

## 2011-05-19 ENCOUNTER — Encounter: Payer: Self-pay | Admitting: Gynecologic Oncology

## 2011-05-19 ENCOUNTER — Other Ambulatory Visit: Payer: Self-pay | Admitting: Oncology

## 2011-05-19 VITALS — BP 100/58 | HR 68 | Temp 97.9°F | Resp 16 | Ht 67.0 in | Wt 171.5 lb

## 2011-05-19 DIAGNOSIS — D392 Neoplasm of uncertain behavior of placenta: Secondary | ICD-10-CM | POA: Insufficient documentation

## 2011-05-19 DIAGNOSIS — Z90710 Acquired absence of both cervix and uterus: Secondary | ICD-10-CM | POA: Insufficient documentation

## 2011-05-19 DIAGNOSIS — O019 Hydatidiform mole, unspecified: Secondary | ICD-10-CM

## 2011-05-19 DIAGNOSIS — I2510 Atherosclerotic heart disease of native coronary artery without angina pectoris: Secondary | ICD-10-CM | POA: Insufficient documentation

## 2011-05-19 NOTE — Patient Instructions (Signed)
Return to clinic in 2 weeks for lab draw.

## 2011-05-19 NOTE — Progress Notes (Signed)
Consult Note: Gyn-Onc  [name] 31 y.o. female  CC:  Chief Complaint  Patient presents with  . GTD    Follow up    WUJ:WJXBJYN is seen, alone for visit today.  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 Per Dr.Laiza Veenstra's subsequent consult note, WHO score was 7 at diagnosis. B HCGs were followed closely but did not normalize and she was begun on methotrexate in August 2012. She had CT head+ CAP at [facility] in August. The marker progressively dropped until mid October, when treatment was changed to bolus actinomycinD at 1.25 mg/m2, single treatment given at [facility] on 11-22-10. Repeat CT Head +CAP was done at [facility]12 with stable 3.2 cm simple cyst in lateral left hepatic lobe and stable small low density lesion in central liver, with enlarging hypervascular mass in endometrial canal involving right wall of uterus, no evidence of metastatic disease. 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 She was given one pre-op dose of MTX day of surgery. B HCG dropped from 5919 to 6.4 by 02-16-11, then plateau'd (serial dilutions done and confirmed HCG). She had CT CAP on 03-15-11, with chest unremarkable, stable left hepatic cyst, no pelvic adenopathy, ovaries not remarkable with corpus luteal cyst, no CT evidence of metastatic disease. She was begun on actinomycin D 12 mcg/kg/day x5 q 2 weeks on 03-13-2011, with BHCG 11.9 on 03-14-11 and 21.1 on 03-21-11, but subsequently down to normal range (2.3) by 04-11-11. Present cycle 4, which began 04-25-11, is thus the second full cycle beyond normalization of marker and we expect to go back to close observation after treatment this week is completed. Her last HCG was <2.0  on 05/16/11.    She is having no other bleeding. She has had some intermittent constipation thru treatment. She is losing more hair. She has had no other bleeding and no symptoms of infection or fever. She denies shortness of breath or any pain.   Full 10 point Review of Systems otherwise negative/ unchanged.    Current Meds:  Outpatient Encounter Prescriptions as of 05/19/2011  Medication Sig Dispense Refill  . Ascorbic Acid (VITAMIN C) 1000 MG tablet Take 1,000 mg by mouth daily.        . Cyanocobalamin (VITAMIN B 12 PO) Take 1 tablet by mouth daily.        Marland Kitchen ibuprofen (ADVIL,MOTRIN) 200 MG tablet Take 400 mg by mouth daily as needed. For pain.       . Multiple Vitamins-Minerals (MULTIVITAMIN PO) Take 1 tablet by mouth daily.      . clindamycin (CLINDAGEL) 1 % gel Apply topically 2 (two) times daily.  30 g  1  . LORazepam (ATIVAN) 0.5 MG tablet Take 0.5 mg by mouth every 6 (six) hours as needed. MAY PUT UNDER TONGUE OR SWALLOW       . ondansetron (ZOFRAN) 8 MG tablet MAY TAKE 1-2 TABS EVERY 12 HRS AS NEEDED  30 tablet  2  . pantoprazole (PROTONIX) 40 MG tablet TAKE 1 TABLET TWICE A DAY X 7 DAYS THEN DAILY  30 tablet  1  . promethazine (PHENERGAN) 25 MG tablet Take 1 tablet (25 mg total) by mouth once.  30 tablet  0  . promethazine (PHENERGAN) 25 MG  tablet Take 25 mg by mouth every 6 (six) hours as needed.      . sucralfate (CARAFATE) 1 GM/10ML suspension Take 10 mLs (1 g total) by mouth 4 (four) times daily.  420 mL  1  . triamcinolone (KENALOG) 0.1 % paste APPLY TO ORAL ULCERS TWICE A DAY AS NEEDED  5 g  1   Facility-Administered Encounter Medications as of 05/19/2011  Medication Dose Route Frequency Provider Last Rate Last Dose  . ondansetron (ZOFRAN) IVPB 8 mg  8 mg Intravenous Once Lennis P Livesay, MD      . sodium chloride 0.9 % injection 10 mL  10 mL Intracatheter PRN Lennis Buzzy Han, MD   10 mL at 03/18/11 1035    Allergy: No Known Allergies  Social Hx:   History   Social  History  . Marital Status: Married    Spouse Name: N/A    Number of Children: N/A  . Years of Education: N/A   Occupational History  . Not on file.   Social History Main Topics  . Smoking status: Never Smoker   . Smokeless tobacco: Not on file  . Alcohol Use: Yes     rarely  . Drug Use: No  . Sexually Active: Yes    Birth Control/ Protection: None   Other Topics Concern  . Not on file   Social History Narrative  . No narrative on file    Past Surgical Hx:  Past Surgical History  Procedure Date  . Cesarean section 2004, 2006  . Dilation and evacuation 08/12/2010    Procedure: DILATATION AND EVACUATION (D&E);  Surgeon: Bing Plume, MD;  Location: [facility];  Service: Gynecology;  Laterality: N/A;  . Dilation and curettage of uterus   . Abdominal hysterectomy 12/17/2010    Past Medical Hx:  Past Medical History  Diagnosis Date  . Cancer     hydatidiform mole  . 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 Diabetes Other   . Diabetes Other   . Coronary artery disease Other     Vitals:  Blood pressure 100/58, pulse 68, temperature 97.9 F (36.6 C), temperature source Oral, resp. rate 16, height 5\' 7"  (1.702 m), weight 171 lb 8 oz (77.792 kg), last menstrual period 05/31/2010.  Physical Exam: No acute distress  Assessment/Plan: 31 year old with stage I score 5 gestational trophoblastic disease (1 point for pretreatment hCG, 2 point for tumor size, 2 point her failure of 1 single agent regimen and (was dispositioned to dactinomycin 5 date of regimen every 2 weeks. She completed 4 cycles of this and had normalization of her hCGs and was treated for 3 cycles post normalization of her hCG. She is feeling very well and feels that the chemotherapy is out of her system. She'll return for a repeat hCG on April 29. Up one month after completion of chemotherapy is her last dose of actinomycin D was  on March 29. We will then follow hCGs monthly for 1 year. This polyp was discussed with the patient she is very pleased that she is done so well are very appreciative of all her care and support.  Verlin Duke A., MD 05/19/2011, 10:17 AM

## 2011-05-20 ENCOUNTER — Telehealth: Payer: Self-pay | Admitting: Oncology

## 2011-05-20 NOTE — Telephone Encounter (Signed)
Added mthly lb appts. S/w pt today re next appt for 4/29 and also confirmed 5/20 appt. Pt to get new schedule when she comes in 4/29.

## 2011-05-23 ENCOUNTER — Other Ambulatory Visit: Payer: 59

## 2011-05-30 ENCOUNTER — Other Ambulatory Visit (HOSPITAL_BASED_OUTPATIENT_CLINIC_OR_DEPARTMENT_OTHER): Payer: 59 | Admitting: Lab

## 2011-05-30 DIAGNOSIS — D392 Neoplasm of uncertain behavior of placenta: Secondary | ICD-10-CM

## 2011-05-31 ENCOUNTER — Telehealth: Payer: Self-pay

## 2011-05-31 NOTE — Telephone Encounter (Signed)
Lm for [name] that her quantitative hcg was in a good range at <2.0 yesterday per Dr. Darrold Span.

## 2011-06-02 ENCOUNTER — Telehealth: Payer: Self-pay | Admitting: Gynecologic Oncology

## 2011-06-02 NOTE — Telephone Encounter (Signed)
HCG results already called by Sallye Ober in Dr. Precious Reel office.

## 2011-06-20 ENCOUNTER — Ambulatory Visit (HOSPITAL_BASED_OUTPATIENT_CLINIC_OR_DEPARTMENT_OTHER): Payer: 59 | Admitting: Oncology

## 2011-06-20 ENCOUNTER — Other Ambulatory Visit (HOSPITAL_BASED_OUTPATIENT_CLINIC_OR_DEPARTMENT_OTHER): Payer: 59 | Admitting: Lab

## 2011-06-20 ENCOUNTER — Other Ambulatory Visit: Payer: Self-pay | Admitting: Oncology

## 2011-06-20 ENCOUNTER — Telehealth: Payer: Self-pay | Admitting: Oncology

## 2011-06-20 ENCOUNTER — Encounter: Payer: Self-pay | Admitting: Oncology

## 2011-06-20 VITALS — BP 104/66 | HR 62 | Temp 98.4°F | Ht 67.0 in | Wt 169.4 lb

## 2011-06-20 DIAGNOSIS — O019 Hydatidiform mole, unspecified: Secondary | ICD-10-CM

## 2011-06-20 DIAGNOSIS — D392 Neoplasm of uncertain behavior of placenta: Secondary | ICD-10-CM

## 2011-06-20 LAB — CBC WITH DIFFERENTIAL/PLATELET
Basophils Absolute: 0.1 10*3/uL (ref 0.0–0.1)
Eosinophils Absolute: 0.4 10*3/uL (ref 0.0–0.5)
HGB: 12.5 g/dL (ref 11.6–15.9)
MCV: 91 fL (ref 79.5–101.0)
MONO#: 0.3 10*3/uL (ref 0.1–0.9)
MONO%: 6.6 % (ref 0.0–14.0)
NEUT#: 2.9 10*3/uL (ref 1.5–6.5)
RDW: 15.3 % — ABNORMAL HIGH (ref 11.2–14.5)
WBC: 5.1 10*3/uL (ref 3.9–10.3)
lymph#: 1.3 10*3/uL (ref 0.9–3.3)

## 2011-06-20 LAB — HCG, QUANTITATIVE, PREGNANCY: hCG, Beta Chain, Quant, S: <2 m[IU]/mL

## 2011-06-20 LAB — COMPREHENSIVE METABOLIC PANEL
ALT: 15 U/L (ref 0–35)
Albumin: 4.5 g/dL (ref 3.5–5.2)
CO2: 27 mEq/L (ref 19–32)
Calcium: 9.4 mg/dL (ref 8.4–10.5)
Chloride: 107 mEq/L (ref 96–112)
Glucose, Bld: 102 mg/dL — ABNORMAL HIGH (ref 70–99)
Potassium: 4.1 mEq/L (ref 3.5–5.3)
Sodium: 141 mEq/L (ref 135–145)
Total Protein: 6.9 g/dL (ref 6.0–8.3)

## 2011-06-20 NOTE — Progress Notes (Signed)
OFFICE PROGRESS NOTE Date of Visit 06-20-2011 Physicians: P.Gehrig, T.Henley  INTERVAL HISTORY:  Patient is seen, alone for visit, in follow up of her history of gestatiional trophoblastic disease, now on observation, with B HCG to be done monthly thru April 2014.  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 Per Dr.Gehrig's subsequent consult note, WHO score was 7 at diagnosis. B HCGs were followed closely but did not normalize and she was begun on methotrexate in August 2012. She had CT head+ CAP at [facility] in August. The marker progressively dropped until mid October, when treatment was changed to bolus actinomycinD at 1.25 mg/m2, single treatment given at [facility] on 11-22-10. Repeat CT Head +CAP was done at [facility] with stable 3.2 cm simple cyst in lateral left hepatic lobe and stable small low density lesion in central liver, with enlarging hypervascular mass in endometrial canal involving right wall of uterus, no evidence of metastatic disease. 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 She was given one pre-op dose of MTX day of surgery. B HCG dropped from 5919 to 6.4 by 02-16-11, then plateau'd (serial dilutions done and confirmed HCG). She had CT CAP on 03-15-11, with chest unremarkable, stable left hepatic cyst, no pelvic adenopathy, ovaries not remarkable with corpus luteal cyst, no CT evidence of metastatic disease. She was begun on actinomycin D 12 mcg/kg/day x5 q 2 weeks on 03-13-2011, with BHCG 11.9 on 03-14-11 and 21.1 on 03-21-11, but subsequently down to normal range (2.3) by 04-11-11. Present cycle 4, which began 04-25-11, is thus the second full cycle beyond normalization of marker. She saw Dr.Gehrig last on 05-19-11.  Patient has been feeling progressively better since  completion of chemotherapy, tho her hair still seems to be thinning. She is working usual schedule as Charity fundraiser at [employer] and denies shortness of breath or other respiratory symptoms, any pain, any bleeding. Bowels are moving normally, no bladder symptoms, no problems at site of PICC. Remainder of 10 point Review of Systems negative.  Objective:  Vital signs in last 24 hours:  BP 104/66  Pulse 62  Temp(Src) 98.4 F (36.9 C) (Oral)  Ht 5\' 7"  (1.702 m)  Wt 169 lb 6.4 oz (76.839 kg)  BMI 26.53 kg/m2  LMP 05/31/2010 Weight is down 1.5 lbs. Easily ambulatory, looks comfortable.  HEENT:mucous membranes moist, pharynx normal without lesions. Hair more thin than when I had seen her last, tho no actual alopecia.  PERRL LymphaticsCervical, supraclavicular, and axillary nodes normal.No inguinal adenopathy. Resp: clear to auscultation bilaterally and normal percussion bilaterally Cardio: regular rate and rhythm GI: soft, non-tender; bowel sounds normal; no masses,  no organomegaly Extremities: extremities normal, atraumatic, no cyanosis or edema Neuro:no sensory deficits noted Slight discoloration remains at site of PICC RUE, no erythema or tenderness. Lab Results:   Basename 06/20/11 1116  WBC 5.1  HGB 12.5  HCT 37.2  PLT 227  ANC 2.9  BMET Full CMET resulted after visit normal with exception of glucose on 102 not fasting  B HCG <2.0 today (added today so that she will not have to make another trip to office next week). Studies/Results:  No results found.  Medications: I have reviewed the patient's current medications.  Assessment/Plan: 1. Gestational trophoblastic disease: history as above, now on observation with monthly HCG. She should see Gyn oncology in ~ 4  months and I will see her in Feb 2014, or sooner if needed. I have put in orders now for quantitative HCG every 4 weeks thru March 30.   [name] P, MD   06/20/2011, 2:42 PM

## 2011-06-20 NOTE — Patient Instructions (Signed)
Monthly B HCG thru April 2014. CBC and chemistries in Nov and Feb  Gyn oncology in ~ Nov  ([phone] if you have to call to set up appointment)  Dr Darrold Span ~ Feb 2014 or sooner if needed.

## 2011-06-20 NOTE — Telephone Encounter (Signed)
appts made and printed for pt aom °

## 2011-06-24 ENCOUNTER — Telehealth: Payer: Self-pay

## 2011-06-24 NOTE — Telephone Encounter (Signed)
Left message in vm on cell that her  Chemistries and hcg level were fine from 06-20-11.

## 2011-07-22 ENCOUNTER — Other Ambulatory Visit (HOSPITAL_BASED_OUTPATIENT_CLINIC_OR_DEPARTMENT_OTHER): Payer: 59

## 2011-07-22 DIAGNOSIS — D392 Neoplasm of uncertain behavior of placenta: Secondary | ICD-10-CM

## 2011-07-23 LAB — HCG, QUANTITATIVE, PREGNANCY: hCG, Beta Chain, Quant, S: <2 m[IU]/mL

## 2011-07-25 ENCOUNTER — Telehealth: Payer: Self-pay

## 2011-07-25 ENCOUNTER — Other Ambulatory Visit: Payer: 59 | Admitting: Lab

## 2011-07-25 NOTE — Telephone Encounter (Signed)
Lm for [name] that her hcg level was < 2 on 07-22-11.  Keep lab appts. As scheduled.

## 2011-08-22 ENCOUNTER — Other Ambulatory Visit: Payer: 59 | Admitting: Lab

## 2011-08-22 DIAGNOSIS — D392 Neoplasm of uncertain behavior of placenta: Secondary | ICD-10-CM

## 2011-08-22 LAB — HCG, QUANTITATIVE, PREGNANCY: hCG, Beta Chain, Quant, S: <2 m[IU]/mL

## 2011-08-24 ENCOUNTER — Telehealth: Payer: Self-pay

## 2011-08-24 NOTE — Telephone Encounter (Signed)
Told [name] that her quantitative HCG level was <2.0 on 08-22-11.  Keep nest moth appt. as scheduled.

## 2011-09-19 ENCOUNTER — Other Ambulatory Visit: Payer: 59

## 2011-09-19 ENCOUNTER — Other Ambulatory Visit: Payer: 59 | Admitting: Lab

## 2011-09-19 ENCOUNTER — Telehealth: Payer: Self-pay | Admitting: Oncology

## 2011-09-19 NOTE — Telephone Encounter (Signed)
Pt called and r/s lab appt for today to tomorrow. Notified RN

## 2011-09-20 ENCOUNTER — Ambulatory Visit (HOSPITAL_BASED_OUTPATIENT_CLINIC_OR_DEPARTMENT_OTHER): Payer: 59 | Admitting: Lab

## 2011-09-20 DIAGNOSIS — D392 Neoplasm of uncertain behavior of placenta: Secondary | ICD-10-CM

## 2011-09-20 LAB — HCG, QUANTITATIVE, PREGNANCY: hCG, Beta Chain, Quant, S: <2 m[IU]/mL

## 2011-09-21 ENCOUNTER — Telehealth: Payer: Self-pay | Admitting: *Deleted

## 2011-09-21 NOTE — Telephone Encounter (Signed)
Pt notified of hCG <2.0," just right".

## 2011-10-17 ENCOUNTER — Other Ambulatory Visit (HOSPITAL_BASED_OUTPATIENT_CLINIC_OR_DEPARTMENT_OTHER): Payer: 59 | Admitting: Lab

## 2011-10-17 DIAGNOSIS — D392 Neoplasm of uncertain behavior of placenta: Secondary | ICD-10-CM

## 2011-10-17 LAB — HCG, QUANTITATIVE, PREGNANCY: hCG, Beta Chain, Quant, S: <2 m[IU]/mL

## 2011-10-20 ENCOUNTER — Telehealth: Payer: Self-pay | Admitting: *Deleted

## 2011-10-20 NOTE — Telephone Encounter (Signed)
Pt notified that Beta Hcg results are in good range <2 per Dr Darrold Span.

## 2011-11-14 ENCOUNTER — Other Ambulatory Visit: Payer: 59 | Admitting: Lab

## 2011-11-15 ENCOUNTER — Telehealth: Payer: Self-pay | Admitting: Oncology

## 2011-11-15 NOTE — Telephone Encounter (Signed)
Pt called today and r/s labs that was missed 11/14/11 to 11/18/11

## 2011-11-18 ENCOUNTER — Other Ambulatory Visit (HOSPITAL_BASED_OUTPATIENT_CLINIC_OR_DEPARTMENT_OTHER): Payer: 59

## 2011-11-18 DIAGNOSIS — D392 Neoplasm of uncertain behavior of placenta: Secondary | ICD-10-CM

## 2011-11-22 ENCOUNTER — Telehealth: Payer: Self-pay

## 2011-11-22 NOTE — Telephone Encounter (Signed)
Told [name] that her HCG level 11-18-11 was good at < 2 per Dr. Darrold Span.

## 2011-12-12 ENCOUNTER — Other Ambulatory Visit (HOSPITAL_BASED_OUTPATIENT_CLINIC_OR_DEPARTMENT_OTHER): Payer: 59 | Admitting: Lab

## 2011-12-12 DIAGNOSIS — D392 Neoplasm of uncertain behavior of placenta: Secondary | ICD-10-CM

## 2011-12-12 LAB — CBC WITH DIFFERENTIAL/PLATELET
BASO%: 1.1 % (ref 0.0–2.0)
HCT: 38.8 % (ref 34.8–46.6)
LYMPH%: 27.1 % (ref 14.0–49.7)
MCHC: 33.8 g/dL (ref 31.5–36.0)
MCV: 89.6 fL (ref 79.5–101.0)
MONO#: 0.5 10*3/uL (ref 0.1–0.9)
MONO%: 10.3 % (ref 0.0–14.0)
NEUT%: 59.8 % (ref 38.4–76.8)
Platelets: 199 10*3/uL (ref 145–400)
RBC: 4.33 10*6/uL (ref 3.70–5.45)
WBC: 4.6 10*3/uL (ref 3.9–10.3)

## 2011-12-12 LAB — COMPREHENSIVE METABOLIC PANEL (CC13)
ALT: 15 U/L (ref 0–55)
Alkaline Phosphatase: 68 U/L (ref 40–150)
CO2: 28 mEq/L (ref 22–29)
Creatinine: 0.7 mg/dL (ref 0.6–1.1)
Glucose: 80 mg/dl (ref 70–99)
Total Bilirubin: 0.4 mg/dL (ref 0.20–1.20)

## 2011-12-13 ENCOUNTER — Telehealth: Payer: Self-pay | Admitting: *Deleted

## 2011-12-13 NOTE — Telephone Encounter (Signed)
Message copied by Phillis Knack on Tue Dec 13, 2011 11:37 AM ------      Message from: Jama Flavors P      Created: Mon Dec 12, 2011  7:47 PM       Labs seen and need follow up: please let her know HCG still good at <2

## 2011-12-13 NOTE — Telephone Encounter (Signed)
Notified patient of results as note below

## 2011-12-15 ENCOUNTER — Encounter: Payer: Self-pay | Admitting: Gynecologic Oncology

## 2011-12-15 ENCOUNTER — Ambulatory Visit: Payer: 59 | Attending: Gynecologic Oncology | Admitting: Gynecologic Oncology

## 2011-12-15 ENCOUNTER — Telehealth: Payer: Self-pay | Admitting: Genetic Counselor

## 2011-12-15 VITALS — BP 98/58 | HR 68 | Temp 98.3°F | Resp 16 | Ht 67.0 in | Wt 164.4 lb

## 2011-12-15 DIAGNOSIS — Z09 Encounter for follow-up examination after completed treatment for conditions other than malignant neoplasm: Secondary | ICD-10-CM | POA: Insufficient documentation

## 2011-12-15 DIAGNOSIS — O019 Hydatidiform mole, unspecified: Secondary | ICD-10-CM | POA: Insufficient documentation

## 2011-12-15 NOTE — Progress Notes (Signed)
Consult Note: Gyn-Onc  [name] 31 y.o. female  CC:  Chief Complaint  Patient presents with  . GTD    follow up    HPI: Patient is seen, alone for visit, in follow up of her history of gestational trophoblastic disease, now on observation, with B HCG to be done monthly thru April 2014.   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 WHO score was 7 at diagnosis. B HCGs were followed closely but did not normalize and she was begun on methotrexate in August 2012. She had CT head+ CAP at [facility] in August. The marker progressively dropped until mid October, when treatment was changed to bolus actinomycin D at 1.25 mg/m2, single treatment given at [facility] on 11-22-10. Repeat CT Head +CAP was done at [facility] with stable 3.2 cm simple cyst in lateral left hepatic lobe and stable small low density lesion in central liver, with enlarging hypervascular mass in endometrial canal involving right wall of uterus, no evidence of metastatic disease. 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 She was given one pre-op dose of MTX day of surgery. B HCG dropped from 5919 to 6.4 by 02-16-11, then plateau'd (serial dilutions done and confirmed HCG). She had CT CAP on 03-15-11, with chest unremarkable, stable left hepatic cyst, no pelvic adenopathy, ovaries not remarkable with corpus luteal cyst, no CT evidence of metastatic disease. She was begun on actinomycin D 12 mcg/kg/day x5 q 2 weeks on 03-13-2011, with BHCG 11.9 on 03-14-11 and 21.1 on 03-21-11, but subsequently down to normal range (2.3) by 04-11-11. Present cycle 4, which began 04-25-11, is thus the second full cycle beyond normalization of marker. I last saw her on 05-19-11. She has been having regular HCGs with the most recent one on 12/12/11  being <2.0.   Interval History:  She herself is doing well. She comes in today and is not really sure why this appointment was scheduled. In the interim her mother has been diagnosed with ovarian carcinoma and had genetic testing and is BRCA1 positive. The patient herself has been leaning towards getting testing but has been going back and forth regarding ths. We did spend approximate to 10 minutes were talking about the implications of her BRCA testing. At this point she is leaning towards having that done. She will continue her hCGs as scheduled into April of 2014. She will call us with the results of her BRCA testing. It should be noted that she did undergo bilateral salpingectomy at the time of her hysterectomy.  Review of Systems  Current Meds:  Outpatient Encounter Prescriptions as of 12/15/2011  Medication Sig Dispense Refill  . Multiple Vitamins-Minerals (MULTIVITAMIN PO) Take 1 tablet by mouth daily.      . Ascorbic Acid (VITAMIN C) 1000 MG tablet Take 1,000 mg by mouth daily.        Marland Kitchen BIOTIN PO Take 10,000 mcg by mouth daily.      Marland Kitchen ibuprofen (ADVIL,MOTRIN) 200 MG tablet Take 400 mg by mouth daily as needed. For pain.       Marland Kitchen LORazepam (ATIVAN) 0.5 MG tablet Take 0.5 mg by mouth every 6 (six) hours as needed. MAY PUT UNDER TONGUE OR SWALLOW       . ondansetron (ZOFRAN) 8 MG tablet MAY TAKE 1-2 TABS EVERY 12 HRS AS NEEDED  30 tablet  2    Allergy: No Known Allergies  Social Hx:   History   Social History  . Marital Status: Married    Spouse Name: N/A    Number of Children: N/A  . Years of Education: N/A   Occupational History  . Not on file.   Social History Main Topics  . Smoking status: Never Smoker   . Smokeless tobacco: Not on file  . Alcohol Use: Yes     Comment: rarely  . Drug Use: No  . Sexually Active: Yes    Birth Control/ Protection: None   Other Topics Concern  . Not on file   Social History Narrative  . No narrative on file    Past Surgical Hx:    Past Surgical History  Procedure Date  . Cesarean section 2004, 2006  . Dilation and evacuation 08/12/2010    Procedure: DILATATION AND EVACUATION (D&E);  Surgeon: Bing Plume, MD;  Location: [facility];  Service: Gynecology;  Laterality: N/A;  . Dilation and curettage of uterus   . Abdominal hysterectomy 12/17/2010    Past Medical Hx:  Past Medical History  Diagnosis Date  . Cancer     hydatidiform mole  . 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 Diabetes Other   . Diabetes Other   . Coronary artery disease Other     Vitals:  Blood pressure 98/58, pulse 68, temperature 98.3 F (36.8 C), temperature source Oral, resp. rate 16, height 5\' 7"  (1.702 m), weight 164 lb 6.4 oz (74.571 kg), last menstrual period 05/31/2010.  Physical Exam: No acute distress  Assessment/Plan: 31 year old Fisher gestational trophoblastic disease who is doing followup hCGs and has no evidence of recurrent disease. She'll continue doing monthly hCGs. We'll 2014. She is deciding whether or not she wants to have genetic testing. What she decides upon that she will contact us with the results.  Balen Woolum A., MD 12/15/2011, 1:55 PM

## 2011-12-15 NOTE — Patient Instructions (Signed)
Call us with genetic testing results.

## 2011-12-15 NOTE — Telephone Encounter (Signed)
S/W pt in re Genetic appt 01/02 @ 11 w/Karen Lowell Guitar.  Welcome packet mailed.

## 2011-12-19 ENCOUNTER — Ambulatory Visit (INDEPENDENT_AMBULATORY_CARE_PROVIDER_SITE_OTHER): Payer: 59 | Admitting: Family Medicine

## 2011-12-19 ENCOUNTER — Ambulatory Visit: Payer: 59 | Admitting: Family Medicine

## 2011-12-19 ENCOUNTER — Encounter: Payer: Self-pay | Admitting: Family Medicine

## 2011-12-19 ENCOUNTER — Telehealth: Payer: Self-pay | Admitting: Gynecologic Oncology

## 2011-12-19 VITALS — BP 100/62 | HR 64 | Temp 97.7°F | Wt 168.0 lb

## 2011-12-19 DIAGNOSIS — J329 Chronic sinusitis, unspecified: Secondary | ICD-10-CM

## 2011-12-19 MED ORDER — AZITHROMYCIN 250 MG PO TABS: ORAL_TABLET | ORAL | Status: DC

## 2011-12-19 MED ORDER — FLUTICASONE PROPIONATE 50 MCG/ACT NA SUSP: 2.0000 | Freq: Every day | NASAL | Status: DC

## 2011-12-19 NOTE — Progress Notes (Signed)
SUBJECTIVE:  [name] is a 31 y.o. female who complains of coryza, congestion and bilateral sinus pain for 30 days. She denies a history of anorexia, chest pain, chills and dizziness and denies a history of asthma. Patient denies smoke cigarettes.   Patient Active Problem List  Diagnosis  . OTHER ACUTE SINUSITIS  . Gestational trophoblastic tumor, invasive mole   Past Medical History  Diagnosis Date  . Cancer     hydatidiform mole  . Gestational trophoblastic tumor, invasive mole 11/28/2010   Past Surgical History  Procedure Date  . Cesarean section 2004, 2006  . Dilation and evacuation 08/12/2010    Procedure: DILATATION AND EVACUATION (D&E);  Surgeon: Bing Plume, MD;  Location: [facility];  Service: Gynecology;  Laterality: N/A;  . Dilation and curettage of uterus   . Abdominal hysterectomy 12/17/2010   History  Substance Use Topics  . Smoking status: Never Smoker   . Smokeless tobacco: Not on file  . Alcohol Use: Yes     Comment: rarely   Family History  Problem Relation Age of Onset  . Diabetes Other   . Diabetes Other   . Coronary artery disease Other    No Known Allergies Current Outpatient Prescriptions on File Prior to Visit  Medication Sig Dispense Refill  . Ascorbic Acid (VITAMIN C) 1000 MG tablet Take 1,000 mg by mouth daily.        Marland Kitchen BIOTIN PO Take 10,000 mcg by mouth daily.      Marland Kitchen ibuprofen (ADVIL,MOTRIN) 200 MG tablet Take 400 mg by mouth daily as needed. For pain.       . Multiple Vitamins-Minerals (MULTIVITAMIN PO) Take 1 tablet by mouth daily.       The PMH, PSH, Social History, Family History, Medications, and allergies have been reviewed in [facility], and have been updated if relevant.  OBJECTIVE: BP 100/62  Pulse 64  Temp 97.7 F (36.5 C)  Wt 168 lb (76.204 kg)  LMP 05/31/2010  She appears well, vital signs are as noted. Ears normal.  Throat and pharynx normal.  Neck supple. No adenopathy in the neck. Nose is congested. Sinuses non tender. The  chest is clear, without wheezes or rales.  ASSESSMENT:  sinusitis  PLAN: Given duration and progression of symptoms, will treat for bacterial sinusitis.  Symptomatic therapy suggested: push fluids, rest and return office visit prn if symptoms persist or worsen.  Call or return to clinic prn if these symptoms worsen or fail to improve as anticipated.

## 2011-12-19 NOTE — Patient Instructions (Addendum)
Take antibiotic as directed.  Drink lots of fluids.  Start Flonase. Treat sympotmatically with Mucinex, nasal saline irrigation, and Tylenol/Ibuprofen.  Call if not improving as expected in 5-7 days.

## 2011-12-19 NOTE — Telephone Encounter (Signed)
Called to speak with patient about following up with a Runner, broadcasting/film/video.  Stating that she has a scheduled appointment for Jan 2.  No concerns or questions voiced.

## 2011-12-21 ENCOUNTER — Ambulatory Visit: Payer: 59 | Admitting: Gynecologic Oncology

## 2012-01-09 ENCOUNTER — Other Ambulatory Visit (HOSPITAL_BASED_OUTPATIENT_CLINIC_OR_DEPARTMENT_OTHER): Payer: 59 | Admitting: Lab

## 2012-01-09 DIAGNOSIS — D392 Neoplasm of uncertain behavior of placenta: Secondary | ICD-10-CM

## 2012-01-09 LAB — HCG, QUANTITATIVE, PREGNANCY: hCG, Beta Chain, Quant, S: <2 m[IU]/mL

## 2012-01-11 ENCOUNTER — Telehealth: Payer: Self-pay

## 2012-01-11 NOTE — Telephone Encounter (Signed)
Message copied by Lorine Bears on Wed Jan 11, 2012 10:08 AM ------      Message from: Jama Flavors P      Created: Mon Jan 09, 2012  6:49 PM       Labs seen and need follow up: please let her know marker good, <2.0

## 2012-01-11 NOTE — Telephone Encounter (Signed)
Left a message for patient regarding the HCG level as noted below.

## 2012-02-02 ENCOUNTER — Encounter: Payer: Self-pay | Admitting: Genetic Counselor

## 2012-02-02 ENCOUNTER — Ambulatory Visit (HOSPITAL_BASED_OUTPATIENT_CLINIC_OR_DEPARTMENT_OTHER): Payer: 59 | Admitting: Genetic Counselor

## 2012-02-02 ENCOUNTER — Other Ambulatory Visit: Payer: 59 | Admitting: Lab

## 2012-02-02 DIAGNOSIS — Z8481 Family history of carrier of genetic disease: Secondary | ICD-10-CM

## 2012-02-02 DIAGNOSIS — Z8041 Family history of malignant neoplasm of ovary: Secondary | ICD-10-CM

## 2012-02-02 DIAGNOSIS — D392 Neoplasm of uncertain behavior of placenta: Secondary | ICD-10-CM

## 2012-02-02 LAB — HCG, QUANTITATIVE, PREGNANCY: hCG, Beta Chain, Quant, S: <2 m[IU]/mL

## 2012-02-02 NOTE — Progress Notes (Signed)
Dr.  Jama Flavors requested a consultation for genetic counseling and risk assessment for [name], a 32 y.o. female, for discussion of her family history of ovarian cancer and a known family BRCA1 mutation. She presents to clinic today to discuss the possibility of a genetic predisposition to cancer, and to further clarify her risks, as well as her family members' risks for cancer.   HISTORY OF PRESENT ILLNESS: [name] is a 32 y.o. female with no personal history of cancer.    Past Medical History  Diagnosis Date  . Cancer     hydatidiform mole  . 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 Cesarean section 2004, 2006  . Dilation and evacuation 08/12/2010    Procedure: DILATATION AND EVACUATION (D&E);  Surgeon: Bing Plume, MD;  Location: [facility];  Service: Gynecology;  Laterality: N/A;  . Dilation and curettage of uterus   . Abdominal hysterectomy 12/17/2010    History  Substance Use Topics  . Smoking status: Never Smoker   . Smokeless tobacco: Not on file  . Alcohol Use: Yes     Comment: rarely    REPRODUCTIVE HISTORY AND PERSONAL RISK ASSESSMENT FACTORS: Menarche was at age 51-12.   Premenopausal Uterus Intact: No, hysterectomy because of molar pregnancy Ovaries Intact: Yes G3P2A1 , first live birth at age 65  She has not previously undergone treatment for infertility.   OCP use for 1 year   She has not used HRT in the past.    FAMILY HISTORY:  We obtained a detailed, 4-generation family history.  Significant diagnoses are listed below: Family History  Problem Relation Age of Onset  . Diabetes Other   . Diabetes Other   . Coronary artery disease Other   . Ovarian cancer Mother 53  . BRCA 1/2 Mother     BRCA1 mutation  . Ovarian cancer Maternal Aunt 59  . Ovarian cancer Maternal Aunt   The patient's mother was diagnosed with ovarian cancer at age 47.  She was tested earlier this year  for BRCA mutations and was found to have a BRCA1 mutation.  She has 8 brothers and sisters.  Two sisters reportedly have ovarian cancer.  The patient's maternal grandfather has three nieces with breast cancer, one of which also had ovarian cancer.  He also had three sisters who had an abdominal cancer.  There is no other reported cancer on either side of the family.  Patient's maternal ancestors are of [place] descent, and paternal ancestors are of [place] and [place] descent. There is no reported Ashkenazi Jewish ancestry. There is no  known consanguinity.  GENETIC COUNSELING RISK ASSESSMENT, DISCUSSION, AND SUGGESTED FOLLOW UP: We reviewed the natural history and genetic etiology of sporadic, familial and hereditary cancer syndromes.  About 5-10% of breast cancer is hereditary.  Of this, about 85% is the result of a BRCA1 or BRCA2 mutation.  She has a 50% chance of testing positive for the mutation found in her mother.  We reviewed the red flags of hereditary cancer syndromes and the dominant inheritance patterns.   The patient's family history is suggestive of the following possible diagnosis: hereditary breast and ovarian cancer syndrome  We discussed that identification of a hereditary cancer syndrome may help her care providers tailor the patients medical management. If a BRCA mutation is detected in this case, the [employer] recommendations would include increased cancer surveillance and possible prophylactic surgery. If a  mutation is detected, the patient will be referred back to the referring provider and to any additional appropriate care providers to discuss the relevant options.   If a mutation is not found in the patient,cancer surveillance options would be similar to population risk screening options.  Typically following the appropriate standard National Comprehensive Cancer Network and American Cancer Society guidelines, with consideration of their personal and  family history risk factors. In this case, the patient will be referred back to their care providers for discussions of management.   After considering the risks, benefits, and limitations, the patient provided informed consent for  the following  testing: Single Site BRCA1 testing through [facility].   Per the patient's request, we will contact her by telephone to discuss these results. A follow up genetic counseling visit will be scheduled if indicated.  The patient was seen for a total of 60 minutes, greater than 50% of which was spent face-to-face counseling.  This plan is being carried out per Dr. Ottie Glazier Livesay's recommendations.  This note will also be sent to the referring provider via the electronic medical record. The patient will be supplied with a summary of this genetic counseling discussion as well as educational information on the discussed hereditary cancer syndromes following the conclusion of their visit.   Patient was discussed with Dr. Drue Second.  _______________________________________________________________________ For Office Staff:  Number of people involved in session: 2 Was an Intern/ student involved with case: no

## 2012-02-03 ENCOUNTER — Telehealth: Payer: Self-pay

## 2012-02-03 NOTE — Telephone Encounter (Signed)
Message copied by Lorine Bears on Fri Feb 03, 2012  3:24 PM ------      Message from: Crest, Juanita Craver      Created: Fri Feb 03, 2012 12:41 PM       Labs seen and need follow up: please let her know in good range at <2.0

## 2012-02-03 NOTE — Telephone Encounter (Signed)
Told [name] her lab results as noted below by Dr. Darrold Span.

## 2012-02-06 ENCOUNTER — Other Ambulatory Visit: Payer: 59 | Admitting: Lab

## 2012-02-09 ENCOUNTER — Telehealth: Payer: Self-pay | Admitting: Genetic Counselor

## 2012-02-09 NOTE — Telephone Encounter (Signed)
Revealed positive BRCA1 mutation.  Patient very frustrated and upset by the news.  I asked if she would like to talk with someone her age who has also tested positive and she thought she would.  I will provide lauren her name and number.

## 2012-02-13 ENCOUNTER — Encounter: Payer: Self-pay | Admitting: Genetic Counselor

## 2012-03-04 ENCOUNTER — Other Ambulatory Visit: Payer: 59 | Admitting: Lab

## 2012-03-05 ENCOUNTER — Encounter: Payer: Self-pay | Admitting: Oncology

## 2012-03-05 ENCOUNTER — Ambulatory Visit (HOSPITAL_BASED_OUTPATIENT_CLINIC_OR_DEPARTMENT_OTHER): Payer: 59 | Admitting: Oncology

## 2012-03-05 ENCOUNTER — Other Ambulatory Visit (HOSPITAL_BASED_OUTPATIENT_CLINIC_OR_DEPARTMENT_OTHER): Payer: 59 | Admitting: Lab

## 2012-03-05 ENCOUNTER — Other Ambulatory Visit: Payer: 59 | Admitting: Lab

## 2012-03-05 VITALS — BP 107/69 | HR 65 | Temp 97.7°F | Resp 18 | Ht 67.0 in | Wt 171.4 lb

## 2012-03-05 DIAGNOSIS — D392 Neoplasm of uncertain behavior of placenta: Secondary | ICD-10-CM

## 2012-03-05 DIAGNOSIS — Z1231 Encounter for screening mammogram for malignant neoplasm of breast: Secondary | ICD-10-CM

## 2012-03-05 DIAGNOSIS — Z1501 Genetic susceptibility to malignant neoplasm of breast: Secondary | ICD-10-CM

## 2012-03-05 LAB — CBC WITH DIFFERENTIAL/PLATELET
BASO%: 1.2 % (ref 0.0–2.0)
EOS%: 1.6 % (ref 0.0–7.0)
MCH: 30.9 pg (ref 25.1–34.0)
MCV: 89.6 fL (ref 79.5–101.0)
MONO%: 7.8 % (ref 0.0–14.0)
RBC: 4.04 10*6/uL (ref 3.70–5.45)
RDW: 12.6 % (ref 11.2–14.5)

## 2012-03-05 LAB — HCG, QUANTITATIVE, PREGNANCY: hCG, Beta Chain, Quant, S: <2 m[IU]/mL

## 2012-03-05 LAB — COMPREHENSIVE METABOLIC PANEL (CC13)
ALT: 18 U/L (ref 0–55)
AST: 19 U/L (ref 5–34)
Albumin: 3.6 g/dL (ref 3.5–5.0)
Alkaline Phosphatase: 55 U/L (ref 40–150)
Potassium: 4.1 mEq/L (ref 3.5–5.1)
Sodium: 139 mEq/L (ref 136–145)
Total Protein: 6.8 g/dL (ref 6.4–8.3)

## 2012-03-05 NOTE — Progress Notes (Signed)
OFFICE PROGRESS NOTE   03/05/2012   Physicians:P.Gehrig, T.Judge Stall (PCP [place])   INTERVAL HISTORY:   Patient is seen, alone for visit, continuing observation for history of gestational trophoblastic disease with monthly HCG planned thru April 2014, and also recently found to have BRCA 1 mutation.  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 Per Dr.Gehrig's subsequent consult note, WHO score was 7 at diagnosis. B HCGs were followed closely but did not normalize and she was begun on methotrexate in August 2012. She had CT head+ CAP at [facility] in August. The marker progressively dropped until mid October, when treatment was changed to bolus actinomycinD at 1.25 mg/m2, single treatment given at [facility] on 11-22-10. Repeat CT Head +CAP was done at [facility] with stable 3.2 cm simple cyst in lateral left hepatic lobe and stable small low density lesion in central liver, with enlarging hypervascular mass in endometrial canal involving right wall of uterus, no evidence of metastatic disease. She went to hysterectomy and bilateral salpingectomy (ovaries still in) at [facility] 12-17-10, with pathology showing complete hydatidaform mole 4.5x4.2x3.5 cm with focal involvement of myometrium and LVI present. She was given one pre-op dose of MTX day of surgery. B HCG dropped from 5919 to 6.4 by 02-16-11, then plateau'd (serial dilutions done and confirmed HCG). She had CT CAP on 03-15-11, with chest unremarkable, stable left hepatic cyst, no pelvic adenopathy, ovaries not remarkable with corpus luteal cyst, no CT evidence of metastatic disease. She was begun on actinomycin D 12 mcg/kg/day x5 q 2 weeks on 03-13-2011, with BHCG 11.9 on 03-14-11 and 21.1 on 03-21-11, but subsequently down to normal range (2.3) by 04-11-11.  Cycle 4 (day 1 on 04-25-11) was the second full cycle beyond normalization of marker. She saw Dr.Gehrig last in Nov  2013,  prior to the BRCA 1 results; I have LM for that office now with the BRCA 1 information.  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 She denies lower respiratory symptoms, abdominal or pelvic pain, difficulty with bowels or bladder, changes in breasts. Remainder of 10 point Review of Systems negative.  Objective:  Vital signs in last 24 hours:  BP 107/69  Pulse 65  Temp 97.7 F (36.5 C) (Oral)  Resp 18  Ht 5\' 7"  (1.702 m)  Wt 171 lb 6.4 oz (77.747 kg)  BMI 26.85 kg/m2  LMP 05/31/2010 Weight is up 2 lbs from May 2013. Easily mobile, looks comfortable. Respirations not labored RA, does not sound nasally congested.   HEENT:PERRLA, sclera clear, anicteric and oropharynx clear, no lesions. Slight dull erythema bilateral posterior pharynx consistent with post nasal drainage LymphaticsCervical, supraclavicular, and axillary nodes normal. Resp: clear to auscultation bilaterally and normal percussion bilaterally Cardio: regular rate and rhythm GI: soft, non-tender; bowel sounds normal; no masses,  no organomegaly Extremities: extremities normal, atraumatic, no cyanosis or edema Neuro:nonfocal Breast:normal without suspicious masses, skin or nipple changes or axillary nodes Skin without rash or ecchymosis  Lab Results:  Results for orders placed in visit on 03/05/12  CBC WITH DIFFERENTIAL      Component Value Range   WBC 4.8  3.9 - 10.3 10e3/uL   NEUT# 3.0  1.5 - 6.5 10e3/uL   HGB 12.5  11.6 - 15.9 g/dL  HCT 36.2  34.8 - 46.6 %   Platelets 161  145 - 400 10e3/uL   MCV 89.6  79.5 - 101.0 fL   MCH 30.9  25.1 - 34.0 pg   MCHC 34.5  31.5 - 36.0 g/dL   RBC 4.09  8.11 - 9.14 10e6/uL   RDW 12.6  11.2 - 14.5 %   lymph# 1.3  0.9 - 3.3 10e3/uL   MONO# 0.4  0.1 - 0.9 10e3/uL   Eosinophils Absolute 0.1  0.0  - 0.5 10e3/uL   Basophils Absolute 0.1  0.0 - 0.1 10e3/uL   NEUT% 61.9  38.4 - 76.8 %   LYMPH% 27.5  14.0 - 49.7 %   MONO% 7.8  0.0 - 14.0 %   EOS% 1.6  0.0 - 7.0 %   BASO% 1.2  0.0 - 2.0 %  COMPREHENSIVE METABOLIC PANEL (CC13)      Component Value Range   Sodium 139  136 - 145 mEq/L   Potassium 4.1  3.5 - 5.1 mEq/L   Chloride 108 (*) 98 - 107 mEq/L   CO2 23  22 - 29 mEq/L   Glucose 75  70 - 99 mg/dl   BUN 78.2  7.0 - 95.6 mg/dL   Creatinine 0.7  0.6 - 1.1 mg/dL   Total Bilirubin 2.13  0.20 - 1.20 mg/dL   Alkaline Phosphatase 55  40 - 150 U/L   AST 19  5 - 34 U/L   ALT 18  0 - 55 U/L   Total Protein 6.8  6.4 - 8.3 g/dL   Albumin 3.6  3.5 - 5.0 g/dL   Calcium 8.9  8.4 - 08.6 mg/dL     Studies/Results:  No results found.  Medications: I have reviewed the patient's current medications.   We have discussed cancer surveillance/ screening with BRCA 1 mutation and I have given her that information in printed form. She needs clinical breast exam 2-4x yearly, annual mammography and breast MRI (I have spoken with [facility] radiologist and LM for high risk clinic MD re timing of MRI, within 3 months of mammograms or spaced out at 6 months), and consideration of ovarian cancer screening. We have discussed timing of oophorectomy, possibly at 35-40 years "or at completion of childbearing" with patient already post hysterectomy; we have mentioned benefit of prophylactic mastectomy in decreasing breast cancer risk by ~ 90%. We have briefly mentioned counseling for children when they are a little older. She has not wanted to schedule further discussion with genetics counselor since phone conversation with her.  All of this is more difficult as her mother is not doing well.  Patient is in agreement with baseline mammograms at [facility] and would like me to see her back after those are done. We will set up MRI depending on information re timing. She probably will want to discuss oophorectomy  with Dr Duard Brady (she will be at [facility] appointment with Dr Duard Brady later this week.)  B HCG monthly labs need to go until April. With timing of labs, these will be March 3, March 31 and April 28.  Assessment/Plan: 1. Gestational trophoblastic disease: history as above. B HCGs have remained in good low range, and we will continue these q 4 weeks thru April. She has not been on OCP since hysterectomy (note BRCA 1 positive) 2.BRCA 1 positive: will schedule first mammograms and follow up MRI timing as above. I will see her back after mammograms. She will need to be seen by some  MD 2-4 x yearly as above. 3.sinus symptoms improved with recent Z pack 4.social stressor with mother's progressive ovarian carcinosarcoms   Patient followed discussion and was in agreement with plan.   LIVESAY,LENNIS P, MD   03/05/2012, 5:03 PM

## 2012-03-06 ENCOUNTER — Telehealth: Payer: Self-pay

## 2012-03-06 NOTE — Telephone Encounter (Signed)
Message copied by Lorine Bears on Tue Mar 06, 2012  9:02 PM ------      Message from: Reece Packer      Created: Mon Mar 05, 2012  1:37 PM       Labs seen and need follow up please let her know still fine < 2.0

## 2012-03-06 NOTE — Patient Instructions (Signed)
Mammograms and MRI will be set up. Dr Darrold Span will see you after mammograms

## 2012-03-06 NOTE — Telephone Encounter (Signed)
Told Ms. Homan her HCG level as noted below. Her PCP is Dr. Ruthe Mannan with [facility] at [facility].  Will sent a copy of Dr. Precious Reel office note from 03-05-12 to Dr. Dayton Martes per Dr. Darrold Span.

## 2012-03-15 ENCOUNTER — Encounter: Payer: Self-pay | Admitting: Genetic Counselor

## 2012-03-15 ENCOUNTER — Telehealth: Payer: Self-pay | Admitting: Oncology

## 2012-03-15 NOTE — Telephone Encounter (Signed)
Added appts for 3/31, 4/18, and 4/28. Pt already on schedule for lb 3/3 and mammo 3/10. lmonvm for pt on both home/cell re new appts. Also confirmed 3/3 lb and 3/10 mammo. Pt to get new schedule when she comes in 3/3. Pt identified on vm at mobile number.

## 2012-04-02 ENCOUNTER — Other Ambulatory Visit (HOSPITAL_BASED_OUTPATIENT_CLINIC_OR_DEPARTMENT_OTHER): Payer: 59 | Admitting: Lab

## 2012-04-02 ENCOUNTER — Other Ambulatory Visit: Payer: 59 | Admitting: Lab

## 2012-04-02 DIAGNOSIS — D392 Neoplasm of uncertain behavior of placenta: Secondary | ICD-10-CM

## 2012-04-02 LAB — HCG, QUANTITATIVE, PREGNANCY: hCG, Beta Chain, Quant, S: <2 m[IU]/mL

## 2012-04-09 ENCOUNTER — Ambulatory Visit: Payer: 59

## 2012-04-09 ENCOUNTER — Telehealth: Payer: Self-pay

## 2012-04-09 NOTE — Telephone Encounter (Signed)
Message copied by Lorine Bears on Mon Apr 09, 2012  4:07 PM ------      Message from: Reece Packer      Created: Tue Apr 03, 2012 11:41 AM       Labs seen and need follow up: please let her know HCG in good range at <2.0 ------

## 2012-04-09 NOTE — Telephone Encounter (Signed)
Told [name] her HCG level  as noted below.

## 2012-04-18 ENCOUNTER — Telehealth: Payer: Self-pay | Admitting: Oncology

## 2012-04-18 NOTE — Telephone Encounter (Signed)
pt was indecisive as wheather she wanted to keep appt...she r/s her est and will call to speak with the nurse about why Dr. Cleophas Dunker wants to see her.

## 2012-04-30 ENCOUNTER — Other Ambulatory Visit (HOSPITAL_BASED_OUTPATIENT_CLINIC_OR_DEPARTMENT_OTHER): Payer: 59

## 2012-04-30 DIAGNOSIS — D392 Neoplasm of uncertain behavior of placenta: Secondary | ICD-10-CM

## 2012-05-04 ENCOUNTER — Telehealth: Payer: Self-pay

## 2012-05-04 NOTE — Telephone Encounter (Signed)
LM for [name] regarding her HCG level as noted below by Dr. Darrold Span.

## 2012-05-04 NOTE — Telephone Encounter (Signed)
Message copied by Lorine Bears on Fri May 04, 2012  2:46 PM ------      Message from: Jama Flavors P      Created: Thu May 03, 2012  1:44 PM       Labs seen and need follow up: if we did not already let her know, please tell her that the marker was good at <2.0. ------

## 2012-05-08 ENCOUNTER — Ambulatory Visit: Payer: 59

## 2012-05-15 ENCOUNTER — Telehealth: Payer: Self-pay | Admitting: *Deleted

## 2012-05-15 ENCOUNTER — Ambulatory Visit: Payer: 59

## 2012-05-15 ENCOUNTER — Telehealth: Payer: Self-pay | Admitting: Oncology

## 2012-05-15 NOTE — Telephone Encounter (Signed)
wantesd to r/s bc she has not had mammo and did not know the reason for est...will call back to speak to the LOUISE

## 2012-05-15 NOTE — Telephone Encounter (Signed)
Pt left a message to cancel her appointment with Dr Darrold Span on 4/23. States she was supposed to have a mammogram before seeing Dr Darrold Span, but she has not scheduled this yet. Plans to schedule it for May or June due to busy schedule. Will keep lab appt for 05/30/12

## 2012-05-18 ENCOUNTER — Ambulatory Visit: Payer: 59 | Admitting: Oncology

## 2012-05-23 ENCOUNTER — Ambulatory Visit: Payer: 59 | Admitting: Oncology

## 2012-05-28 ENCOUNTER — Other Ambulatory Visit: Payer: 59

## 2012-05-30 ENCOUNTER — Other Ambulatory Visit (HOSPITAL_BASED_OUTPATIENT_CLINIC_OR_DEPARTMENT_OTHER): Payer: 59 | Admitting: Lab

## 2012-05-30 DIAGNOSIS — D392 Neoplasm of uncertain behavior of placenta: Secondary | ICD-10-CM

## 2012-05-31 ENCOUNTER — Telehealth: Payer: Self-pay | Admitting: *Deleted

## 2012-05-31 NOTE — Telephone Encounter (Signed)
Message copied by Kathlynn Grate on Thu May 31, 2012  5:05 PM ------      Message from: Jama Flavors P      Created: Wed May 30, 2012  6:09 PM       Labs seen and need follow up: please let her know hCG good at <2.  Please ask how her mom is doing and tell her I have been thinking about them. ------

## 2012-05-31 NOTE — Telephone Encounter (Signed)
Spoke with patient, informed of hcg level. She states mother is "doing", not too good, the doctors have her taking Doxil every 28days, but she is hanging in there. Patient understands she has no appts at this time, plans to have mammogram completed and r/s cancelled appt with Dr Darrold Span.

## 2012-07-20 ENCOUNTER — Telehealth: Payer: Self-pay

## 2012-07-20 NOTE — Telephone Encounter (Signed)
Left message for [name] to call back to discuss rescheduling her appt. From April 1014 that she cancelled as she had not had her mammograms. Note mother with Ovarian cancer so she may be busy with her care.

## 2012-08-09 ENCOUNTER — Ambulatory Visit: Payer: 59 | Admitting: Family Medicine

## 2012-08-09 DIAGNOSIS — Z0289 Encounter for other administrative examinations: Secondary | ICD-10-CM

## 2012-10-02 ENCOUNTER — Telehealth: Payer: Self-pay

## 2012-10-02 NOTE — Telephone Encounter (Signed)
[name] has not set up first mammograms as disscused at visit with Dr. Darrold Span  03-05-12.   She wants to take a break from medical tests since it has been a couple of difficult years with her diagnosis and then her mother diagnosed with ovarian cancer and passing away. She also is still paying on the medical bill she incurred. She stated that she will call [employer] to see if the mammograms and Breast MRI would be covered. Encouraged her to have an MD see her with breast exam 2-4 times a year as recommended by Dr. Darrold Span.  [name] will call to make a follow up appointment with Dr. Darrold Span once the mammograms are set up. Patient appreciated the follow up phone call.

## 2013-01-04 ENCOUNTER — Telehealth: Payer: Self-pay

## 2013-01-04 NOTE — Telephone Encounter (Signed)
[name] called to schedule an appointment  to discuss oophorectomy and double mastectomy  with BRCA 1 positive gene mutation. Spoke with Warner Mccreedy NP and she set [name] up to see Dr. Rica Records to discuss surgeries on 01-30-13 @0915 .  [name] verbalized understanding.

## 2013-01-30 ENCOUNTER — Ambulatory Visit: Payer: 59 | Attending: Gynecologic Oncology | Admitting: Gynecologic Oncology

## 2013-01-30 ENCOUNTER — Encounter: Payer: Self-pay | Admitting: Gynecologic Oncology

## 2013-01-30 ENCOUNTER — Ambulatory Visit: Payer: 59

## 2013-01-30 ENCOUNTER — Encounter (INDEPENDENT_AMBULATORY_CARE_PROVIDER_SITE_OTHER): Payer: Self-pay

## 2013-01-30 VITALS — BP 115/66 | HR 69 | Temp 98.0°F | Resp 16 | Ht 67.0 in | Wt 164.0 lb

## 2013-01-30 DIAGNOSIS — Z1501 Genetic susceptibility to malignant neoplasm of breast: Secondary | ICD-10-CM

## 2013-01-30 DIAGNOSIS — D392 Neoplasm of uncertain behavior of placenta: Secondary | ICD-10-CM

## 2013-01-30 DIAGNOSIS — O019 Hydatidiform mole, unspecified: Secondary | ICD-10-CM

## 2013-01-30 NOTE — Progress Notes (Signed)
Consult Note: Gyn-Onc  [name] 32 y.o. female  CC:  Chief Complaint  Patient presents with  . 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 WHO score was 7 at diagnosis. B HCGs were followed closely but did not normalize and she was begun on methotrexate in August 2012. She had CT head+ CAP at [facility] in August. The marker progressively dropped until mid October, when treatment was changed to bolus actinomycin D at 1.25 mg/m2, single treatment given at [facility] on 11-22-10. Repeat CT Head +CAP was done at [facility] with stable 3.2 cm simple cyst in lateral left hepatic lobe and stable small low density lesion in central liver, with enlarging hypervascular mass in endometrial canal involving right wall of uterus, no evidence of metastatic disease. 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 She was given one pre-op dose of MTX day of surgery. B HCG dropped from 5919 to 6.4 by 02-16-11, then plateau'd (serial dilutions done and confirmed HCG). She had CT CAP on 03-15-11, with chest unremarkable, stable left hepatic cyst, no pelvic adenopathy, ovaries not remarkable with corpus luteal cyst, no CT evidence of metastatic disease. She was begun on actinomycin D 12 mcg/kg/day x5 q 2 weeks on 03-13-2011, with BHCG 11.9 on 03-14-11 and 21.1 on 03-21-11, but subsequently down to normal range (2.3) by 04-11-11. Present cycle 4, which began 04-25-11, is thus the second full cycle beyond normalization of marker. I last saw her on 05-19-11. She has been having regular HCGs with the most recent one on 12/12/11 being <2.0.   Interval History:  Her mother was diagnosed ovarian carcinosarcoma was found to be BRCA positive. The patient was sexually tested is also BRCA with one  positive. At the time of her surgery she did undergo bilateral salpingectomy but did not undergo oophorectomy secondary to her young age. She was recently seen by Dr. Darrold Span 03/06/2012. Her mammograms are scheduled but she has not done this. She comes in to discuss the role of bilateral oophorectomy.  Her mother passed away 08-04-2012. She had BRCA testing January 2. She's not really considered a lot of the implications of this is not researched this issue to a great extent.  Review of Systems  Current Meds:  Outpatient Encounter Prescriptions as of 01/30/2013  Medication Sig  . ibuprofen (ADVIL,MOTRIN) 200 MG tablet Take 400 mg by mouth daily as needed. For pain.   . Multiple Vitamins-Minerals (MULTIVITAMIN PO) Take 1 tablet by mouth daily.  . Ascorbic Acid (VITAMIN C) 1000 MG tablet Take 1,000 mg by mouth daily.    Marland Kitchen BIOTIN PO Take 10,000 mcg by mouth daily.    Allergy: No Known Allergies  Social Hx:   History   Social History  . Marital Status: Married    Spouse Name: N/A    Number of Children: N/A  . Years of Education: N/A   Occupational History  . Not on file.   Social History Main Topics  . Smoking status: Never Smoker   . Smokeless tobacco: Not on file  . Alcohol Use: Yes     Comment: rarely  . Drug Use: No  . Sexual Activity: Yes    Birth Control/ Protection: None   Other Topics Concern  . Not on file  Social History Narrative  . No narrative on file    Past Surgical Hx:  Past Surgical History  Procedure Laterality Date  . Cesarean section  2004, 2006  . Dilation and evacuation  08/12/2010    Procedure: DILATATION AND EVACUATION (D&E);  Surgeon: Bing Plume, MD;  Location: [facility];  Service: Gynecology;  Laterality: N/A;  . Dilation and curettage of uterus    . Abdominal hysterectomy  12/17/2010    Past Medical Hx:  Past Medical History  Diagnosis Date  . Cancer     hydatidiform mole  . Gestational trophoblastic tumor, invasive mole 11/28/2010     Family Hx:  Family History  Problem Relation Age of Onset  . Diabetes Other   . Diabetes Other   . Coronary artery disease Other   . Ovarian cancer Mother 60  . BRCA 1/2 Mother     BRCA1 mutation  . Ovarian cancer Maternal Aunt 59  . Ovarian cancer Maternal Aunt     Vitals:  Blood pressure 115/66, pulse 69, temperature 98 F (36.7 C), temperature source Oral, resp. rate 16, height 5\' 7"  (1.702 m), weight 164 lb (74.39 kg), last menstrual period 05/31/2010.  Physical Exam: No acute distress  Breast: No palpable masses, no skin changes, no nipple discharge. No axillary lymphadenopathy.  Abdomen: Soft, nontender, nondistended.  Pelvic: Normal external female genitalia. Bimanual examination reveals no masses or nodularity.  Assessment/Plan: 32 year old history which initial trophoblastic disease diagnosed and treated in 2012 2 2013 was no evidence of recurrent disease from that perspective. She was found to be BRCA1 positive his mother had an ovarian carcinosarcoma. She comes in to discuss the role of potential oophorectomy versus surveillance.   25 minutes face to face time was spent in consultation with the patient as well as her friend. This was in addition to her physical exam.  We discussed that she did have bilateral salpingectomy at the time of her hysterectomy and this was most likely help decrease her risk of "ovarian" cancer. However, we would recommend completion oophorectomy at some point. We discussed that the timing of oophorectomy can be variable. Until she decides to proceed with oophorectomy that we would recommend screening with biannual ultrasound, CA 125, and pelvic exam. We also discussed that an oophorectomy would help decrease her risk of subsequent breast cancer by approximately 50%. We discussed the importance of having breast cancer screening as well. She has not started back as of yet. All be happy to perform her breast exams however, she needs to have her  mammograms and MRI scheduled. We will also recommend that she be seen by breast surgeons as well as plastic surgery as many of her questions regarding how to proceed with genetic mutation are related more to the breast cancer prophylaxis rather than ovarian cancer prophylaxis. She was given contact information for that today. We will draw her CA-125 today. She'll return to see me in 6 months. I will also schedule an ultrasound to  [facility] A., MD 01/30/2013, 9:25 AM

## 2013-01-30 NOTE — Patient Instructions (Signed)
We will call you with the results of your blood work and ultrasound from today. I will see you back in 6 months.  BRCA-1 and BRCA-2 BRCA-1 and BRCA-2 are 2 genes that are linked with hereditary breast and ovarian cancers. About 200,000 women are diagnosed with invasive breast cancer each year and about 23,000 with ovarian cancer (according to the American Cancer Society). Of these cancers, about 5% to 10% will be due to a mutation in one of the BRCA genes. Men can also inherit an increased risk of developing breast cancer, primarily from an alteration in the BRCA-2 gene.  Individuals with mutations in BRCA1 or BRCA2 have significantly elevated risks for breast cancer (up to 80% lifetime risk), ovarian cancer (up to 40% lifetime risk), bilateral breast cancer and other types of cancers. BRCA mutations are inherited and passed from generation to generation. One half of the time, they are passed from the father's side of the family.  The DNA in white blood cells is used to detect mutations in the BRCA genes. While the gene products (proteins) of the BRCA genes act only in breast and ovarian tissue, the genes are present in every cell of the body and blood is the most easily accessible source of that DNA. PREPARATION FOR TEST The test for BRCA mutations is done on a blood sample collected by needle from a vein in the arm. The test does not require surgical biopsy of breast or ovarian tissue.  NORMAL FINDINGS No genetic mutations. Ranges for normal findings may vary among different laboratories and hospitals. You should always check with your doctor after having lab work or other tests done to discuss the meaning of your test results and whether your values are considered within normal limits. MEANING OF TEST  Your caregiver will go over the test results with you and discuss the importance and meaning of your results, as well as treatment options and the need for additional tests if necessary. OBTAINING THE  TEST RESULTS It is your responsibility to obtain your test results. Ask the lab or department performing the test when and how you will get your results. OTHER THINGS TO KNOW Your test results may have implications for other family members. When one member of a family is tested for BRCA mutations, issues often arise about how or whether to share this information with other family members. Seek advice from a genetic counselor about communication of result with your family members.  Pre and post test consultation with a health care provider knowledgeable about genetic testing cannot be overemphasized.  There are many issues to be considered when preparing for a genetic test and upon learning the results, and a genetic counselor has the knowledge and experience to help you sort through them.  If the BRCA test is positive, the options include increased frequency of check-ups (e.g., mammography, blood tests for CA-125, or transvaginal ultrasonography); medications that could reduce risk (e.g., oral contraceptives or tamoxifen); or surgical removal of the ovaries or breasts. There are a number of variables involved and it is important to discuss your options with your doctor and genetic counselor. Research studies have reported that for every 1000 women negative for BRCA mutations, between 12 and 45 of them will develop breast cancer by age 21 and between 3 and 4 will develop ovarian cancer by age 69. The risk increases with age. The test can be ordered by a doctor, preferably by one who can also offer genetic counseling. The blood sample will be sent  to a laboratory that specializes in BRCA testing. The American Society of Clinical Oncology and the National Breast Cancer Coalition encourage women seeking the test to participate in long-term outcome studies to help gather information on the effectiveness of different check-up and treatment options. Document Released: 02/11/2004 Document Revised: 04/11/2011  Document Reviewed: 12/24/2007 [facility] Patient Information 2014 [place], [place].

## 2013-01-31 LAB — CA 125: CA 125: 6.3 U/mL (ref 0.0–30.2)

## 2013-02-04 ENCOUNTER — Telehealth: Payer: Self-pay | Admitting: *Deleted

## 2013-02-04 ENCOUNTER — Ambulatory Visit (HOSPITAL_COMMUNITY)
Admission: RE | Admit: 2013-02-04 | Discharge: 2013-02-04 | Disposition: A | Discharge status: Home / Self Care | Payer: 59 | Source: Ambulatory Visit | Attending: Gynecologic Oncology | Admitting: Gynecologic Oncology

## 2013-02-04 DIAGNOSIS — N831 Corpus luteum cyst of ovary, unspecified side: Secondary | ICD-10-CM | POA: Insufficient documentation

## 2013-02-04 DIAGNOSIS — O019 Hydatidiform mole, unspecified: Secondary | ICD-10-CM

## 2013-02-04 NOTE — Telephone Encounter (Signed)
Per NP, notified pt [place] and CA125 ormal, pt to return in 41months. Pt verbalized understanding. No further concerns.

## 2013-04-01 ENCOUNTER — Other Ambulatory Visit: Payer: Self-pay | Admitting: Oncology

## 2013-04-01 ENCOUNTER — Other Ambulatory Visit: Payer: Self-pay

## 2013-04-01 DIAGNOSIS — Z1231 Encounter for screening mammogram for malignant neoplasm of breast: Secondary | ICD-10-CM

## 2013-04-08 ENCOUNTER — Encounter: Payer: Self-pay | Admitting: *Deleted

## 2013-04-08 ENCOUNTER — Other Ambulatory Visit: Payer: Self-pay | Admitting: *Deleted

## 2013-04-08 ENCOUNTER — Telehealth: Payer: Self-pay | Admitting: *Deleted

## 2013-04-08 NOTE — Telephone Encounter (Signed)
Pt left a voice mail stating she needs an appointment with Dr Marko Plume after her mammogram on 04/23/13. Pt states she is available M/T/W in the mornings. POF done for lab appointment and Dr Marko Plume.

## 2013-04-09 ENCOUNTER — Other Ambulatory Visit: Payer: Self-pay | Admitting: Oncology

## 2013-04-09 DIAGNOSIS — D392 Neoplasm of uncertain behavior of placenta: Secondary | ICD-10-CM

## 2013-04-09 DIAGNOSIS — Z1501 Genetic susceptibility to malignant neoplasm of breast: Secondary | ICD-10-CM

## 2013-04-09 DIAGNOSIS — Z1509 Genetic susceptibility to other malignant neoplasm: Secondary | ICD-10-CM

## 2013-04-11 ENCOUNTER — Telehealth: Payer: Self-pay | Admitting: Oncology

## 2013-04-11 NOTE — Telephone Encounter (Signed)
lmonvm for pt re appt for 3/30. schedule mailed.

## 2013-04-16 ENCOUNTER — Encounter: Payer: Self-pay | Admitting: Internal Medicine

## 2013-04-16 ENCOUNTER — Ambulatory Visit (INDEPENDENT_AMBULATORY_CARE_PROVIDER_SITE_OTHER): Payer: 59 | Admitting: Internal Medicine

## 2013-04-16 VITALS — BP 108/66 | HR 73 | Temp 98.1°F | Wt 170.5 lb

## 2013-04-16 DIAGNOSIS — J019 Acute sinusitis, unspecified: Secondary | ICD-10-CM

## 2013-04-16 MED ORDER — AMOXICILLIN-POT CLAVULANATE 875-125 MG PO TABS: 1.0000 | ORAL_TABLET | Freq: Two times a day (BID) | ORAL | Status: DC

## 2013-04-16 NOTE — Progress Notes (Signed)
HPI: Pt presents to the office today for concerns regarding sinus congestion. Symptoms started one week ago with headache, nasal congestion, nasal discharge with thick green sputum, and cough. Symptoms have progressively gotten worse. She denies fever, chills, body aches, or shortness of breath.   She has tried OTC Tylenol cold and flu as well as Nyquil with little relief. She has not had any sick contacts.     Review of Systems    Past Medical History  Diagnosis Date  . Cancer     hydatidiform mole  . 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 Diabetes Other   . Diabetes Other   . Coronary artery disease Other   . Ovarian cancer Mother 77  . BRCA 1/2 Mother     BRCA1 mutation  . Ovarian cancer Maternal Aunt 42  . Ovarian cancer Maternal Aunt     History   Social History  . Marital Status: Married    Spouse Name: N/A    Number of Children: N/A  . Years of Education: N/A   Occupational History  . Not on file.   Social History Main Topics  . Smoking status: Never Smoker   . Smokeless tobacco: Not on file  . Alcohol Use: Yes     Comment: rarely  . Drug Use: No  . Sexual Activity: Yes    Birth Control/ Protection: None   Other Topics Concern  . Not on file   Social History Narrative  . No narrative on file    No Known Allergies   Constitutional: Positive headache. Denies fever, chills, or abrupt weight changes.  HEENT:  Positive eye pain, pressure behind the eyes, facial pain, nasal congestion and sore throat. Denies eye redness, ear pain, ringing in the ears, wax buildup, runny nose or bloody nose. Respiratory: Positive cough. Denies difficulty breathing or shortness of breath.  Cardiovascular: Denies chest pain, chest tightness, palpitations or swelling in the hands or feet.   No other specific complaints in a complete review of systems (except as listed in HPI above).  Objective:     General: Appears his stated age, well developed, well nourished in NAD. HEENT: Head: normal shape and size, sinus tenderness noted; Eyes: sclera white, no icterus, conjunctiva pink, PERRLA and EOMs intact; Ears: Tm's gray and intact, normal light reflex; Nose: mucosa pink and moist, septum midline; Mild maxillary tenderness to palpation L>R. Throat/Mouth: + PND. Teeth present, mucosa pink and moist, no exudate noted, no lesions or ulcerations noted.  Neck: Neck supple, trachea midline. No massses, lumps or thyromegaly present.  Cardiovascular: Normal rate and rhythm. S1,S2 noted.  No murmur, rubs or gallops noted. No JVD or BLE edema. No carotid bruits noted. Pulmonary/Chest: Normal effort and positive vesicular breath sounds. No respiratory distress. No wheezes, rales or ronchi noted.      Assessment & Plan:   Acute bacterial sinusitis  Can use a Neti Pot which can be purchased from your local drug store. Recommended taking a daily antihistamine: Zyrtec, Claritin, or Allegra Augmentin BID for 10 days Rest and drink plenty of fluids  RTC as needed or if symptoms persist.  [name], Demetrius Charity, Student-NP

## 2013-04-16 NOTE — Progress Notes (Signed)
Pre visit review using our clinic review tool, if applicable. No additional management support is needed unless otherwise documented below in the visit note. 

## 2013-04-16 NOTE — Progress Notes (Signed)
HPI  Pt presents to the clinic today with c/o headache, nasal congestion, post nasal drainage and cough. She reports this started 1 week ago. She is blowing thick green mucous out of her nose. She denies fever, chills or body aches. She has taken OTC tylenol cold and flu, and Nyquil with some relief. She has no history of allergies or asthma. She has not had sick contacts that she is aware of.  Review of Systems    Past Medical History  Diagnosis Date  . Cancer     hydatidiform mole  . 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 Diabetes Other   . Diabetes Other   . Coronary artery disease Other   . Ovarian cancer Mother 45  . BRCA 1/2 Mother     BRCA1 mutation  . Ovarian cancer Maternal Aunt 83  . Ovarian cancer Maternal Aunt     History   Social History  . Marital Status: Married    Spouse Name: N/A    Number of Children: N/A  . Years of Education: N/A   Occupational History  . Not on file.   Social History Main Topics  . Smoking status: Never Smoker   . Smokeless tobacco: Not on file  . Alcohol Use: Yes     Comment: rarely  . Drug Use: No  . Sexual Activity: Yes    Birth Control/ Protection: None   Other Topics Concern  . Not on file   Social History Narrative  . No narrative on file    No Known Allergies   Constitutional: Positive headache, fatigue. Denies fevers or abrupt weight changes.  HEENT:  Positive eye pain, pressure behind the eyes, facial pain, nasal congestion and sore throat. Denies eye redness, ear pain, ringing in the ears, wax buildup, runny nose or bloody nose. Respiratory: Positive cough. Denies difficulty breathing or shortness of breath.  Cardiovascular: Denies chest pain, chest tightness, palpitations or swelling in the hands or feet.   No other specific complaints in a complete review of systems (except as listed in HPI above).  Objective:  BP 108/66  Pulse 73   Temp(Src) 98.1 F (36.7 C) (Oral)  Wt 170 lb 8 oz (77.338 kg)  SpO2 98%  LMP 05/31/2010   General: Appears her stated age, well developed, well nourished in NAD. HEENT: Head: normal shape and size, maxillary sinus tenderness noted; Eyes: sclera white, no icterus, conjunctiva pink, PERRLA and EOMs intact; Ears: Tm's gray and intact, normal light reflex; Nose: mucosa pink and moist, septum midline; Throat/Mouth: + PND. Teeth present, mucosa pink and moist, no exudate noted, no lesions or ulcerations noted.  Neck: Neck supple, trachea midline. No massses, lumps or thyromegaly present.  Cardiovascular: Normal rate and rhythm. S1,S2 noted.  No murmur, rubs or gallops noted. No JVD or BLE edema. No carotid bruits noted. Pulmonary/Chest: Normal effort and positive vesicular breath sounds. No respiratory distress. No wheezes, rales or ronchi noted.      Assessment & Plan:   Acute bacterial sinusitis  Can use a Neti Pot which can be purchased from your local drug store. Flonase 2 sprays each nostril for 3 days and then as needed. Augmentin BID for 10 days  RTC as needed or if symptoms persist.

## 2013-04-16 NOTE — Patient Instructions (Addendum)

## 2013-04-23 ENCOUNTER — Ambulatory Visit
Admission: RE | Admit: 2013-04-23 | Discharge: 2013-04-23 | Disposition: A | Discharge status: Home / Self Care | Payer: Self-pay | Source: Ambulatory Visit

## 2013-04-23 ENCOUNTER — Other Ambulatory Visit: Payer: Self-pay | Admitting: Oncology

## 2013-04-23 DIAGNOSIS — Z1231 Encounter for screening mammogram for malignant neoplasm of breast: Secondary | ICD-10-CM

## 2013-04-29 ENCOUNTER — Encounter: Payer: Self-pay | Admitting: Oncology

## 2013-04-29 ENCOUNTER — Ambulatory Visit (HOSPITAL_BASED_OUTPATIENT_CLINIC_OR_DEPARTMENT_OTHER): Payer: 59 | Admitting: Oncology

## 2013-04-29 ENCOUNTER — Other Ambulatory Visit (HOSPITAL_BASED_OUTPATIENT_CLINIC_OR_DEPARTMENT_OTHER): Payer: 59

## 2013-04-29 ENCOUNTER — Telehealth: Payer: Self-pay | Admitting: Oncology

## 2013-04-29 VITALS — BP 104/69 | HR 98 | Temp 98.3°F | Resp 19 | Ht 67.0 in | Wt 171.0 lb

## 2013-04-29 DIAGNOSIS — Z1509 Genetic susceptibility to other malignant neoplasm: Principal | ICD-10-CM

## 2013-04-29 DIAGNOSIS — D392 Neoplasm of uncertain behavior of placenta: Secondary | ICD-10-CM

## 2013-04-29 DIAGNOSIS — Z87898 Personal history of other specified conditions: Secondary | ICD-10-CM

## 2013-04-29 DIAGNOSIS — Z1501 Genetic susceptibility to malignant neoplasm of breast: Secondary | ICD-10-CM

## 2013-04-29 DIAGNOSIS — Z803 Family history of malignant neoplasm of breast: Secondary | ICD-10-CM

## 2013-04-29 DIAGNOSIS — Z8041 Family history of malignant neoplasm of ovary: Secondary | ICD-10-CM

## 2013-04-29 LAB — COMPREHENSIVE METABOLIC PANEL (CC13)
ALK PHOS: 66 U/L (ref 40–150)
ALT: 13 U/L (ref 0–55)
ANION GAP: 10 meq/L (ref 3–11)
AST: 21 U/L (ref 5–34)
Albumin: 4.3 g/dL (ref 3.5–5.0)
BUN: 10.6 mg/dL (ref 7.0–26.0)
CO2: 25 meq/L (ref 22–29)
CREATININE: 0.7 mg/dL (ref 0.6–1.1)
Calcium: 9.5 mg/dL (ref 8.4–10.4)
Chloride: 108 mEq/L (ref 98–109)
GLUCOSE: 99 mg/dL (ref 70–140)
Potassium: 4 mEq/L (ref 3.5–5.1)
Sodium: 143 mEq/L (ref 136–145)
Total Bilirubin: 0.45 mg/dL (ref 0.20–1.20)
Total Protein: 7.7 g/dL (ref 6.4–8.3)

## 2013-04-29 LAB — CBC WITH DIFFERENTIAL/PLATELET
BASO%: 1.1 % (ref 0.0–2.0)
BASOS ABS: 0.1 10*3/uL (ref 0.0–0.1)
EOS ABS: 0.1 10*3/uL (ref 0.0–0.5)
EOS%: 0.9 % (ref 0.0–7.0)
HCT: 39 % (ref 34.8–46.6)
HGB: 13.3 g/dL (ref 11.6–15.9)
LYMPH%: 29.2 % (ref 14.0–49.7)
MCH: 30 pg (ref 25.1–34.0)
MCHC: 34.1 g/dL (ref 31.5–36.0)
MCV: 88 fL (ref 79.5–101.0)
MONO#: 0.4 10*3/uL (ref 0.1–0.9)
MONO%: 7.7 % (ref 0.0–14.0)
NEUT%: 61.1 % (ref 38.4–76.8)
NEUTROS ABS: 3.3 10*3/uL (ref 1.5–6.5)
NRBC: 0 % (ref 0–0)
Platelets: 240 10*3/uL (ref 145–400)
RBC: 4.43 10*6/uL (ref 3.70–5.45)
RDW: 12.4 % (ref 11.2–14.5)
WBC: 5.4 10*3/uL (ref 3.9–10.3)
lymph#: 1.6 10*3/uL (ref 0.9–3.3)

## 2013-04-29 NOTE — Progress Notes (Signed)
OFFICE PROGRESS NOTE   04/29/2013   Physicians:P.Gehrig, T.Eunice Blase (PCP [facility]   INTERVAL HISTORY:  Patient is seen, alone for visit, now following for BRCA 1 mutation, previously treated at this office for gestational trophoblastic neoplasm. Initially with the BRCA 1 information in Jan 2014, she was too close to her mother's terminal illness to feel able to address this, but more recently has resumed follow up and is considering options for surgical intervention. She saw Dr Alycia Rossetti 01-30-13, with CA125 6.3 and pelvic US 02-04-13; she has 6 mo follow up with gyn oncology to include CA125 and pelvic US. Bilateral mammograms at [facility] had scattered areas of fibroglandular density and no mammographic findings of concern. She has not had breast MRI, to be set up now. Her sister and maternal aunt in [place] are considering BRCA testing. They have not met with genetics counselor yet, but know this is available. I believe that her more extended family (out of country? --Note mother had 8 siblings) were also aware of genetics information previously.  Primary care is with Dr Lucky Rathke at [facility], who also sees her son and daughter. Children are doing well.   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 Per Dr.Gehrig's subsequent consult note, WHO score was 7 at diagnosis. B HCGs were followed closely but did not normalize and she was begun on methotrexate in August 2012. She had CT head+ CAP at [facility] in August. The marker progressively dropped until mid October, when treatment was changed to bolus actinomycinD at 1.25 mg/m2, single treatment given at [facility] on 11-22-10. Repeat CT Head +CAP was done at [facility]11-2-12 with stable 3.2 cm simple cyst in lateral left hepatic lobe and stable small low density lesion in central liver, with enlarging hypervascular mass in endometrial  canal involving right wall of uterus, no evidence of metastatic disease. 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 She was given one pre-op dose of MTX day of surgery. B HCG dropped from 5919 to 6.4 by 02-16-11, then plateau'd (serial dilutions done and confirmed HCG). She had CT CAP on 03-15-11, with chest unremarkable, stable left hepatic cyst, no pelvic adenopathy, ovaries not remarkable with corpus luteal cyst, no CT evidence of metastatic disease. She was begun on actinomycin D 12 mcg/kg/day x5 q 2 weeks on 03-13-2011, with BHCG 11.9 on 03-14-11 and 21.1 on 03-21-11, but subsequently down to normal range (2.3) by 04-11-11. Cycle 4 (day 1 on 04-25-11) was the second full cycle beyond normalization of marker.  After gestational trophoblastic disease was resolved, patient's mother developed ovarian carcinosarcoma and was found to be BRCA 1 positive; Kilea was also tested and is BRCA 1 positive.     Review of systems as above, also: Feeling entirely well. Continues to work as Therapist, sports at [employer]. She is careful with lifting etc since low back injury in college that has been noticeable with certain activities or positions since then. No recent infectious illness. No noted changes in breasts. No hot flashes. No abdominal or pelvic discomfort. No LE swelling. No bleeding. Remainder of 10 point Review of Systems negative.  Objective:  Vital signs in last 24 hours:  BP 104/69  Pulse 98  Temp(Src) 98.3 F (36.8 C) (Oral)  Resp 19  Ht 5' 7"  (1.702  m)  Wt 171 lb (77.565 kg)  BMI 26.78 kg/m2  SpO2 100%  LMP 05/31/2010 weight is stable from a year ago.  Alert, oriented and appropriate. Ambulatory without difficulty, easily able to change positions on exam table. Looks comfortable and well.   HEENT:PERRL, sclerae not icteric. Oral mucosa moist without  lesions, posterior pharynx clear.  Neck supple. No JVD.  Lymphatics:no cervical,suraclavicular, axillary or inguinal adenopathy Resp: clear to auscultation bilaterally and normal percussion bilaterally Cardio: regular rate and rhythm. No gallop. GI: soft, nontender, not distended, no mass or organomegaly. Normally active bowel sounds. Surgical incision not remarkable. Musculoskeletal/ Extremities: without pitting edema, cords, tenderness. Spine not tender to direct palpation Neuro: no peripheral neuropathy. Otherwise nonfocal Skin without rash, ecchymosis, petechiae Breasts: bilaterally without dominant mass, skin or nipple findings. Axillae benign.   Lab Results:  Results for orders placed in visit on 04/29/13  CBC WITH DIFFERENTIAL      Result Value Ref Range   WBC 5.4  3.9 - 10.3 10e3/uL   NEUT# 3.3  1.5 - 6.5 10e3/uL   HGB 13.3  11.6 - 15.9 g/dL   HCT 39.0  34.8 - 46.6 %   Platelets 240  145 - 400 10e3/uL   MCV 88.0  79.5 - 101.0 fL   MCH 30.0  25.1 - 34.0 pg   MCHC 34.1  31.5 - 36.0 g/dL   RBC 4.43  3.70 - 5.45 10e6/uL   RDW 12.4  11.2 - 14.5 %   lymph# 1.6  0.9 - 3.3 10e3/uL   MONO# 0.4  0.1 - 0.9 10e3/uL   Eosinophils Absolute 0.1  0.0 - 0.5 10e3/uL   Basophils Absolute 0.1  0.0 - 0.1 10e3/uL   NEUT% 61.1  38.4 - 76.8 %   LYMPH% 29.2  14.0 - 49.7 %   MONO% 7.7  0.0 - 14.0 %   EOS% 0.9  0.0 - 7.0 %   BASO% 1.1  0.0 - 2.0 %   nRBC 0  0 - 0 %  COMPREHENSIVE METABOLIC PANEL (MK34)      Result Value Ref Range   Sodium 143  136 - 145 mEq/L   Potassium 4.0  3.5 - 5.1 mEq/L   Chloride 108  98 - 109 mEq/L   CO2 25  22 - 29 mEq/L   Glucose 99  70 - 140 mg/dl   BUN 10.6  7.0 - 26.0 mg/dL   Creatinine 0.7  0.6 - 1.1 mg/dL   Total Bilirubin 0.45  0.20 - 1.20 mg/dL   Alkaline Phosphatase 66  40 - 150 U/L   AST 21  5 - 34 U/L   ALT 13  0 - 55 U/L   Total Protein 7.7  6.4 - 8.3 g/dL   Albumin 4.3  3.5 - 5.0 g/dL   Calcium 9.5  8.4 - 10.4 mg/dL   Anion Gap 10  3 - 11 mEq/L     CA125 01-30-13     6.3   Studies/Results:  DIGITAL SCREENING BILATERAL MAMMOGRAM WITH [facility] COMPARISON: None.  ACR Breast Density Category b: There are scattered areas of  fibroglandular density.  FINDINGS:  There are no findings suspicious for malignancy. Images were  processed with CAD.  IMPRESSION:  No mammographic evidence of malignancy. A result letter of this  screening mammogram will be mailed directly to the patient.  RECOMMENDATION:  Screening mammogram in one year.(Code:SM-B-01Y)  Bilateral breast MRI is recommended annually.  BI-RADS CATEGORY 1: Negative.  TRANSABDOMINAL ULTRASOUND OF PELVIS  TECHNIQUE:  Transabdominal ultrasound examination of the pelvis was performed  including evaluation of the uterus, ovaries, adnexal regions, and  pelvic cul-de-sac.  COMPARISON: None.  FINDINGS:  Uterus  Previous hysterectomy  Endometrium  Not applicable  Right ovary  Measurements: 2.8 x 1.4 x 1.6 cm. Normal appearance/no adnexal  mass.  Left ovary  Measurements: 3.2 x 1.5 x 1.9 cm. There is a collapsed corpus  luteal cyst identified within the left ovary.  Other findings: Trace stress set a small amount of free fluid is  noted within the pelvis.  IMPRESSION:  1. No suspicious mass identified involving the ovaries.  2. Left ovarian corpus luteal cyst appears collapsed.     Medications: I have reviewed the patient's current medications.  DISCUSSION: we have discussed yearly MRI per standard recommendations for BRCA +, which our imaging facilities prefer within 3 months of mammograms, so that it is reasonable to schedule this close to the 3 month time. We have discussed risk reduction in gyn and ovarian cancer with oophorectomy, which patient understands should be possible laparoscopically and will cause menopausal symptoms. She and husband have almost decided against trying to have another child with surrogate, due to cost, ages of their son  and daughter now and fact that she does not want to parent an infant "if I am much older at all than this". She feels that definitive surgery will be her preference rather than continuing every 6 mo ultrasounds of the ovaries, at least for much longer. She has not met with general surgeon or plastic surgeon about prophylactic mastectomies/ reconstruction. We have discussed this in general, and certainly she could have consultation visit to start. I have given her names of plastic surgeons C.Sanger, B. Thimmappa and L.Shanon Brow, as these have been working closely with breast oncology group here.   As always, Evamaria is very reasonable as she considers options, and very open to having monitoring now. She mentions that loss of her mother is still difficult, tho not quite as intense as a year ago.  She may speak with financial staff, not clear if deductible or copay was the $200 that she had to pay for last [place].  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 Naiyah is considering prophylactic oophorectomy and prophylactic bilateral mastectomies, but is not quite ready to proceed. She will get breast MRI ~ mid to late June. She will see Dr Alycia Rossetti with CA125 and [place] mid June. I will see her back in Sept. We can refer for surgery/ plastic surgery consultations for prophylactic mastectomies at any time if she requests. Note she was not on OCP for the GTD after hysterectomy, but with BRCA status, I wonder if that would be reasonable now -- will defer to Dr Alycia Rossetti. 2.gestational trophoblastic disease 2012, treated with MTX x 1 prior to hysterectomy with bilateral salpingectomy then 4 cycles actinomycin D (=2 beyond normalization of marker) thru 04-2011. She was followed  with serial BHCGs x 1 year. .   Patient followed discussion well and is in agreement with plan. She needs [place] and CA125 set up prior to Dr Elenora Gamma visit. Time spent 25 min, >50% counseling and coordination of care.   Taft Worthing P, MD   04/29/2013, 2:15 PM

## 2013-04-29 NOTE — Telephone Encounter (Signed)
, °

## 2013-05-04 ENCOUNTER — Other Ambulatory Visit: Payer: Self-pay | Admitting: Oncology

## 2013-05-04 DIAGNOSIS — Z1501 Genetic susceptibility to malignant neoplasm of breast: Secondary | ICD-10-CM

## 2013-05-04 DIAGNOSIS — Z1509 Genetic susceptibility to other malignant neoplasm: Principal | ICD-10-CM

## 2013-05-04 NOTE — Progress Notes (Signed)
Note of information:  PCP is Dr Arnette Norris (name misspelled in my note 04-29-13)  L.Marko Plume, MD

## 2013-05-10 ENCOUNTER — Telehealth: Payer: Self-pay | Admitting: Oncology

## 2013-05-10 NOTE — Telephone Encounter (Signed)
lmonvm for pt re appt for lab 6/11 (cell). central will call pt re [place] appt. s/w Kenisha in central  to make sure appt  gets scheduled. called primary number also but pt was not in and phone was answered by a child. schedule mailed

## 2013-06-27 ENCOUNTER — Encounter: Payer: Self-pay | Admitting: Oncology

## 2013-06-27 NOTE — Progress Notes (Signed)
Per nurse the patient had questions on upcoming appt??  I called and left a message for her to call me back.

## 2013-06-28 ENCOUNTER — Telehealth: Payer: Self-pay | Admitting: *Deleted

## 2013-06-28 NOTE — Telephone Encounter (Signed)
Pt called wanting to let Dr Marko Plume know that she is not going to get the Breast MRI done at this time (currently scheduled for 07/01/13).  She states she is going to wait until the late fall or winter.  The co-pay is $700.  She states she is going to meet with the financial counselors at [facility] to discuss any kind of co-pay assistance that may be available.  She states she will proceed with the vaginal ultrasound.  Will forward note to Dr Marko Plume and her desk RN's.  SLJ

## 2013-07-01 ENCOUNTER — Encounter: Payer: Self-pay | Admitting: Oncology

## 2013-07-01 ENCOUNTER — Ambulatory Visit (HOSPITAL_COMMUNITY): Admission: RE | Admit: 2013-07-01 | Payer: 59 | Source: Ambulatory Visit

## 2013-07-01 NOTE — Progress Notes (Signed)
Patient had left message about possible asst with mammograms. I advised of pmt plan for balances under 5000.00 because she has insurance.

## 2013-07-11 ENCOUNTER — Other Ambulatory Visit (HOSPITAL_BASED_OUTPATIENT_CLINIC_OR_DEPARTMENT_OTHER): Payer: 59

## 2013-07-11 ENCOUNTER — Ambulatory Visit (HOSPITAL_COMMUNITY)
Admission: RE | Admit: 2013-07-11 | Discharge: 2013-07-11 | Disposition: A | Discharge status: Home / Self Care | Payer: 59 | Source: Ambulatory Visit | Attending: Oncology | Admitting: Oncology

## 2013-07-11 ENCOUNTER — Ambulatory Visit (HOSPITAL_COMMUNITY): Payer: 59

## 2013-07-11 DIAGNOSIS — Z1501 Genetic susceptibility to malignant neoplasm of breast: Secondary | ICD-10-CM | POA: Insufficient documentation

## 2013-07-11 DIAGNOSIS — Z1509 Genetic susceptibility to other malignant neoplasm: Principal | ICD-10-CM

## 2013-07-11 DIAGNOSIS — N839 Noninflammatory disorder of ovary, fallopian tube and broad ligament, unspecified: Secondary | ICD-10-CM | POA: Insufficient documentation

## 2013-07-11 DIAGNOSIS — O019 Hydatidiform mole, unspecified: Secondary | ICD-10-CM | POA: Insufficient documentation

## 2013-07-11 DIAGNOSIS — D392 Neoplasm of uncertain behavior of placenta: Secondary | ICD-10-CM

## 2013-07-11 LAB — CA 125: CA 125: 6.7 U/mL (ref 0.0–30.2)

## 2013-07-18 ENCOUNTER — Ambulatory Visit: Payer: 59 | Admitting: Gynecologic Oncology

## 2013-07-25 ENCOUNTER — Telehealth: Payer: Self-pay | Admitting: *Deleted

## 2013-07-25 NOTE — Telephone Encounter (Signed)
Left a message to notify pt of future appointments at the lab and with Dr Marko Plume on 10/28/13.

## 2013-08-15 ENCOUNTER — Ambulatory Visit: Payer: 59 | Attending: Gynecologic Oncology | Admitting: Gynecologic Oncology

## 2013-08-15 ENCOUNTER — Encounter: Payer: Self-pay | Admitting: Gynecologic Oncology

## 2013-08-15 VITALS — BP 107/63 | HR 69 | Temp 98.4°F | Resp 18 | Wt 170.4 lb

## 2013-08-15 DIAGNOSIS — Z1501 Genetic susceptibility to malignant neoplasm of breast: Secondary | ICD-10-CM | POA: Insufficient documentation

## 2013-08-15 DIAGNOSIS — Z8041 Family history of malignant neoplasm of ovary: Secondary | ICD-10-CM | POA: Insufficient documentation

## 2013-08-15 DIAGNOSIS — N839 Noninflammatory disorder of ovary, fallopian tube and broad ligament, unspecified: Secondary | ICD-10-CM

## 2013-08-15 DIAGNOSIS — N838 Other noninflammatory disorders of ovary, fallopian tube and broad ligament: Secondary | ICD-10-CM | POA: Insufficient documentation

## 2013-08-15 DIAGNOSIS — Z1509 Genetic susceptibility to other malignant neoplasm: Secondary | ICD-10-CM

## 2013-08-15 DIAGNOSIS — O019 Hydatidiform mole, unspecified: Secondary | ICD-10-CM

## 2013-08-15 DIAGNOSIS — Z90710 Acquired absence of both cervix and uterus: Secondary | ICD-10-CM | POA: Insufficient documentation

## 2013-08-15 NOTE — Progress Notes (Signed)
Consult Note: Gyn-Onc  [name] 33 y.o. female  CC:  Chief Complaint  Patient presents with  . 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 WHO score was 7 at diagnosis. B HCGs were followed closely but did not normalize and she was begun on methotrexate in August 2012. She had CT head+ CAP at [facility] in August. The marker progressively dropped until mid October, when treatment was changed to bolus actinomycin D at 1.25 mg/m2, single treatment given at [facility] on 11-22-10. Repeat CT Head +CAP was done at [facility] with stable 3.2 cm simple cyst in lateral left hepatic lobe and stable small low density lesion in central liver, with enlarging hypervascular mass in endometrial canal involving right wall of uterus, no evidence of metastatic disease. She went to hysterectomy and bilateral salpingectomy at [facility] 12-17-10, with pathology showing complete hydatidaform mole 4.5x4.2x3.5 cm with focal involvement of myometrium and LVI present. She was given one pre-op dose of MTX day of surgery. B HCG dropped from 5919 to 6.4 by 02-16-11, then plateau'd (serial dilutions done and confirmed HCG). She had CT CAP on 03-15-11, with chest unremarkable, stable left hepatic cyst, no pelvic adenopathy, ovaries not remarkable with corpus luteal cyst, no CT evidence of metastatic disease. She was begun on actinomycin D 12 mcg/kg/day x5 q 2 weeks on 03-13-2011, with BHCG 11.9 on 03-14-11 and 21.1 on 03-21-11, but subsequently down to normal range (2.3) by 04-11-11. Present cycle 4, which began 04-25-11, is thus the second full cycle beyond normalization of marker.  She had been having regular HCGs with the most recent one on 4/14 being <2.0.  Her mother was diagnosed ovarian carcinosarcoma was found to be BRCA positive. The patient tested and is also BRCA with one positive. At the time of her surgery she did undergo bilateral  salpingectomy but did not undergo oophorectomy secondary to her young age.  Her mother passed away 07/25/12.   Interval History: She had her mammogram in March of 2015 but has not had her MRI and wants to defer until the fall secondary to her finances. She had an ultrasound June 15 that revealed: FINDINGS:  Uterus is absent.  Right ovary measures 2.9 x 1.5 x 1.5 cm in length. There are physiologic follicles in the right ovary. There is an area of decreased echogenicity in the right ovary measuring 1.2 x 0.8 x 1.2 cm. This area of decreased echogenicity was not present on the previous study. Left ovary measures 2.8 x 1.6 x 2.2 cm. There are physiologic follicles in the left ovary. Beyond physiologic follicles on the left, there is no evidence of ovarian region mass. There is no free pelvic fluid.  IMPRESSION:  Small hypoechoic mass in the right ovary measuring 1.2 x 0.8 x 1.2 cm, not present previously. This small mass may represent a hemorrhagic cyst or small endometrioma. Given the clinical history, however, this area warrants close imaging surveillance. A followup ultrasound to evaluate the right ovary in 3 months would be warranted given this circumstance. Uterus absent. Study otherwise unremarkable.  CA 125 on June 11 was 6.7.  Review of Systems: She's overall doing well and really denies any complaints. She spent going to [place] to visit family for the next 3 weeks. She's not really sure she was to do regarding her oophorectomy. She was not aware that she have to have as much followup. Her 10  point review of systems is negative  Current Meds:  Outpatient Encounter Prescriptions as of 08/15/2013  Medication Sig  . ibuprofen (ADVIL,MOTRIN) 200 MG tablet Take 400 mg by mouth daily as needed. For pain.   . Multiple Vitamins-Minerals (MULTIVITAMIN PO) Take 1 tablet by mouth daily.    Allergy: No Known Allergies  Social Hx:   History   Social History  . Marital Status: Married    Spouse  Name: N/A    Number of Children: N/A  . Years of Education: N/A   Occupational History  . Not on file.   Social History Main Topics  . Smoking status: Never Smoker   . Smokeless tobacco: Not on file  . Alcohol Use: Yes     Comment: rarely  . Drug Use: No  . Sexual Activity: Yes    Birth Control/ Protection: None   Other Topics Concern  . Not on file   Social History Narrative  . No narrative on file    Past Surgical Hx:  Past Surgical History  Procedure Laterality Date  . Cesarean section  2004, 2006  . Dilation and evacuation  08/12/2010    Procedure: DILATATION AND EVACUATION (D&E);  Surgeon: Melina Schools, MD;  Location: [facility];  Service: Gynecology;  Laterality: N/A;  . Dilation and curettage of uterus    . Abdominal hysterectomy  12/17/2010    Past Medical Hx:  Past Medical History  Diagnosis Date  . Cancer     hydatidiform mole  . 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 Diabetes Other   . Diabetes Other   . Coronary artery disease Other   . Ovarian cancer Mother 82  . BRCA 1/2 Mother     BRCA1 mutation  . Ovarian cancer Maternal Aunt 72  . Ovarian cancer Maternal Aunt     Vitals:  Blood pressure 107/63, pulse 69, temperature 98.4 F (36.9 C), temperature source Oral, resp. rate 18, weight 170 lb 6.4 oz (77.293 kg), last menstrual period 05/31/2010.  Physical Exam: No acute distress  Breast: No palpable masses, no skin changes, no nipple discharge. No axillary lymphadenopathy.  Abdomen: Soft, nontender, nondistended. No palpable masses, no incisional hernias.  Pelvic: Normal external female genitalia. Bimanual examination reveals no masses or nodularity. I cannot appreciate her ovaries.   Assessment/Plan: 33 year old with gestational trophoblastic disease diagnosed and treated in 2012 and 2013 who has no evidence of recurrent disease from that perspective. 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 She has a 1.2 cm lesion on her right ovary that may be consistent with a small hemorrhagic cyst or endometrioma. She will proceed with a repeat ultrasound in September and I will call her with the results. She will consider after her vacation whether or not she wants to proceed with BSO prior to age 1. She continues to want to defer on her MRI. She's going to start a flex plan to help with her medical expenses.    Betzayda Braxton A., MD 08/15/2013, 11:42 AM

## 2013-10-04 ENCOUNTER — Telehealth: Payer: Self-pay | Admitting: *Deleted

## 2013-10-04 ENCOUNTER — Ambulatory Visit (HOSPITAL_COMMUNITY)
Admission: RE | Admit: 2013-10-04 | Discharge: 2013-10-04 | Disposition: A | Discharge status: Home / Self Care | Payer: 59 | Source: Ambulatory Visit | Attending: Gynecologic Oncology | Admitting: Gynecologic Oncology

## 2013-10-04 DIAGNOSIS — Z8041 Family history of malignant neoplasm of ovary: Secondary | ICD-10-CM | POA: Diagnosis not present

## 2013-10-04 DIAGNOSIS — Z90710 Acquired absence of both cervix and uterus: Secondary | ICD-10-CM | POA: Insufficient documentation

## 2013-10-04 DIAGNOSIS — Z09 Encounter for follow-up examination after completed treatment for conditions other than malignant neoplasm: Secondary | ICD-10-CM | POA: Insufficient documentation

## 2013-10-04 DIAGNOSIS — Z1509 Genetic susceptibility to other malignant neoplasm: Secondary | ICD-10-CM

## 2013-10-04 DIAGNOSIS — Z1501 Genetic susceptibility to malignant neoplasm of breast: Secondary | ICD-10-CM

## 2013-10-04 NOTE — Telephone Encounter (Signed)
Per MD,.Called pt notified us normal. Pt verbalized understanding. No further concerns.

## 2013-10-16 ENCOUNTER — Ambulatory Visit (HOSPITAL_COMMUNITY): Payer: 59

## 2013-10-25 ENCOUNTER — Other Ambulatory Visit: Payer: Self-pay

## 2013-10-25 DIAGNOSIS — Z1509 Genetic susceptibility to other malignant neoplasm: Secondary | ICD-10-CM

## 2013-10-25 DIAGNOSIS — D392 Neoplasm of uncertain behavior of placenta: Secondary | ICD-10-CM

## 2013-10-25 DIAGNOSIS — Z1501 Genetic susceptibility to malignant neoplasm of breast: Secondary | ICD-10-CM

## 2013-10-26 ENCOUNTER — Other Ambulatory Visit: Payer: Self-pay | Admitting: Oncology

## 2013-10-28 ENCOUNTER — Encounter: Payer: Self-pay | Admitting: Oncology

## 2013-10-28 ENCOUNTER — Ambulatory Visit (HOSPITAL_BASED_OUTPATIENT_CLINIC_OR_DEPARTMENT_OTHER): Payer: 59 | Admitting: Oncology

## 2013-10-28 ENCOUNTER — Other Ambulatory Visit: Payer: 59

## 2013-10-28 ENCOUNTER — Telehealth: Payer: Self-pay | Admitting: Oncology

## 2013-10-28 VITALS — BP 100/72 | HR 60 | Temp 98.1°F | Resp 22 | Ht 67.0 in | Wt 175.6 lb

## 2013-10-28 DIAGNOSIS — Z8041 Family history of malignant neoplasm of ovary: Secondary | ICD-10-CM

## 2013-10-28 DIAGNOSIS — Z1231 Encounter for screening mammogram for malignant neoplasm of breast: Secondary | ICD-10-CM

## 2013-10-28 DIAGNOSIS — Z87898 Personal history of other specified conditions: Secondary | ICD-10-CM

## 2013-10-28 DIAGNOSIS — Z803 Family history of malignant neoplasm of breast: Secondary | ICD-10-CM

## 2013-10-28 DIAGNOSIS — Z1501 Genetic susceptibility to malignant neoplasm of breast: Secondary | ICD-10-CM

## 2013-10-28 DIAGNOSIS — Z1509 Genetic susceptibility to other malignant neoplasm: Secondary | ICD-10-CM

## 2013-10-28 DIAGNOSIS — D392 Neoplasm of uncertain behavior of placenta: Secondary | ICD-10-CM

## 2013-10-28 NOTE — Progress Notes (Signed)
OFFICE PROGRESS NOTE   10/28/2013   Physicians:P.Gehrig, T.Henley, [name] (PCP [place])   INTERVAL HISTORY:   Patient is seen, alone for visit, in continuing attention to BRCA 1 mutation, previously also treated at this office for unrelated gestational trophoblastic disease. She is also following regularly with Dr Gehrig, having had US of ovaries on 07-15-13 with small hypoechoic mass on right ovary of uncertain significance, saw Dr Gehrig on 08-15-13, and had follow up US on 10-04-13 with unremarkable ovaries bilaterally. CA 125 on 07-11-13 was 6.7.  She is to see Dr Gehrig with US at least q 6 months. [name] is making plans to proceed wth prophylactic oophorectomy. Last bilateral mammograms were on 04-23-13, however she did not have breast MRI as planned, I believe due to financial concerns. I have explained to her now that the MRI needs to be within 3 months after mamograms, per radiology.     [name] has been doing well, with no complaints that seem referable to the BRCA concerns. 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 She has had no recent infectious illness, good energy, good appetite.  She does not have PAC. She will have flu vaccine thru work.  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 Per Dr.Gehrig's subsequent consult note, WHO score was 7 at diagnosis. B HCGs were followed closely but did not normalize and she was begun on methotrexate in August 2012. She had CT head+ CAP at [facility] in August. The marker progressively dropped until mid October, when treatment was changed to bolus actinomycinD at 1.25 mg/m2, single treatment given at [facility] on 11-22-10. Repeat CT Head +CAP was done at [facility] with stable 3.2 cm simple cyst in lateral left hepatic lobe and stable small low density lesion in central liver, with enlarging hypervascular mass in endometrial canal involving right wall  of uterus, no evidence of metastatic disease. 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 She was given one pre-op dose of MTX day of surgery. B HCG dropped from 5919 to 6.4 by 02-16-11, then plateau'd (serial dilutions done and confirmed HCG). She had CT CAP on 03-15-11, with chest unremarkable, stable left hepatic cyst, no pelvic adenopathy, ovaries not remarkable with corpus luteal cyst, no CT evidence of metastatic disease. She was begun on actinomycin D 12 mcg/kg/day x5 q 2 weeks on 03-13-2011, with BHCG 11.9 on 03-14-11 and 21.1 on 03-21-11, but subsequently down to normal range (2.3) by 04-11-11. Cycle 4 (day 1 on 04-25-11) was the second full cycle beyond normalization of marker.  After gestational trophoblastic disease was resolved, patient's mother developed ovarian carcinosarcoma and was found to be BRCA 1 positive; [name] was also tested and is BRCA 1 positive.  Review of systems as above, also: No pain. No respiratory, GI, musculoskeletal, cardiac complaints. No bleeding. Remainder of 10 point Review of Systems negative.  Objective:  Vital signs in last 24 hours:  BP 100/72  Pulse 60  Temp(Src) 98.1 F (36.7 C) (Oral)  Resp 22  Ht 5' 7" (1.702 m)  Wt 175 lb 9.6 oz (79.652 kg)  BMI 27.50 kg/m2  LMP 05/31/2010  Alert, oriented and appropriate. Ambulatory without difficulty.    HEENT:PERRL, sclerae not icteric. Oral mucosa moist without lesions, posterior pharynx clear.  Neck supple. No JVD.    Lymphatics:no cervical,suraclavicular, axillary or inguinal adenopathy Resp: clear to auscultation bilaterally and normal percussion bilaterally Cardio: regular rate and rhythm. No gallop. GI: soft, nontender, not distended, no mass or organomegaly. Normally active bowel sounds.  Musculoskeletal/ Extremities: without pitting edema,  cords, tenderness Neuro: no peripheral neuropathy. Otherwise nonfocal Skin without rash, ecchymosis, petechiae Breasts: without dominant mass, skin or nipple findings. Axillae benign  Lab Results:  Results for orders placed in visit on 07/11/13  CA 125      Result Value Ref Range   CA 125 6.7  0.0 - 30.2 U/mL     Studies/Results:  TRANSABDOMINAL AND TRANSVAGINAL ULTRASOUND OF PELVIS  TECHNIQUE:  Both transabdominal and transvaginal ultrasound examinations of the  pelvis were performed. Transabdominal technique was performed for  global imaging of the pelvis including uterus, ovaries, adnexal  regions, and pelvic cul-de-sac. It was necessary to proceed with  endovaginal exam following the transabdominal exam to visualize the  ovaries.  COMPARISON: Ultrasound 07/11/2013. CT Abdomen and Pelvis  03/15/2011.  FINDINGS:  Uterus  Measurements: Surgically absent.  Endometrium  Thickness: Surgically absent.  Right ovary  Measurements: 3.3 x 1.6 x 1.5 cm. Several small follicles, the  largest in 1.1 cm Normal appearance/no adnexal mass.  Left ovary  Measurements: 3.0 x 2.3 x 2.2 cm. Several small follicles. The  largest is 17 mm. Normal appearance/no adnexal mass.  Other findings  No free fluid.  IMPRESSION:  Normal sonographic appearance of the ovaries, with small follicles  bilaterally. No pelvic free fluid or suspicious findings.  Medications: I have reviewed the patient's current medications.  DISCUSSION: we have reviewed fact that oophorectomy is indicated by age 35-40 and decreases risk of breast cancer by 50%. We have discussed need for yearly mammograms and MRI breast if no mastectomy. I have offered to let her talk to another patient with BRCA mutation who has had prophylactic surgery, and she will let me know if she wants to do this.   Assessment/Plan: 1.BRCA 1 positive 33 yo lady: Strong FH ovarian and some breast ca in maternal relatives. Following closely until she  hopefully proceeds with prophylactic oophorectomy and bilateral mastectomies. She will see Dr Gehrig with US ~ Jan and I will see her also in 6 months. She needs MRI breast within 3 months after mammograms in late March 2016.  2.gestational trophoblastic disease 2012, treated with MTX x 1 prior to hysterectomy with bilateral salpingectomy then 4 cycles actinomycin D (=2 beyond normalization of marker) thru 04-2011. She was followed with serial BHCGs x 1 year.   All questions answered and she is in agreement with plans above.      LIVESAY,LENNIS P, MD   10/28/2013, 5:16 PM    

## 2013-10-28 NOTE — Telephone Encounter (Signed)
per pof to sch appt-[facility] not open for March 2016 & Dr Alycia Rossetti not opened per [facility]. Will sch mamm & call ot witht time & date

## 2013-10-29 LAB — CA 125: CA 125: 8 U/mL (ref <35)

## 2013-10-29 LAB — CA 125(PREVIOUS METHOD): CA 125: 6.9 U/mL (ref 0.0–30.2)

## 2013-10-31 ENCOUNTER — Telehealth: Payer: Self-pay

## 2013-10-31 NOTE — Telephone Encounter (Signed)
Message copied by Baruch Merl on Thu Oct 31, 2013  3:03 PM ------      Message from: Gordy Levan      Created: Tue Oct 29, 2013  8:09 AM       Labs seen and need follow up: please let her know CA125 still in good low range at 8 ------

## 2013-10-31 NOTE — Telephone Encounter (Signed)
Told [name] the results of her Ca-125 as noted below by Dr. Marko Plume. Patient verbalized understanding.

## 2013-11-16 ENCOUNTER — Telehealth: Payer: Self-pay | Admitting: Oncology

## 2013-11-16 NOTE — Telephone Encounter (Signed)
, °

## 2013-12-02 ENCOUNTER — Encounter: Payer: Self-pay | Admitting: Oncology

## 2014-01-06 IMAGING — US IR FLUORO GUIDE CV LINE*R*
1 series · 1 of 1 positions shown · non-contrast
Comparison: None.

CLINICAL DATA: Gestational trophoblastic tumor; central venous
access is request for chemotherapy.

[Series 1: ir fluoro guide cv midline picc *left* · 1 of 1 slices shown]
[im 1/1]
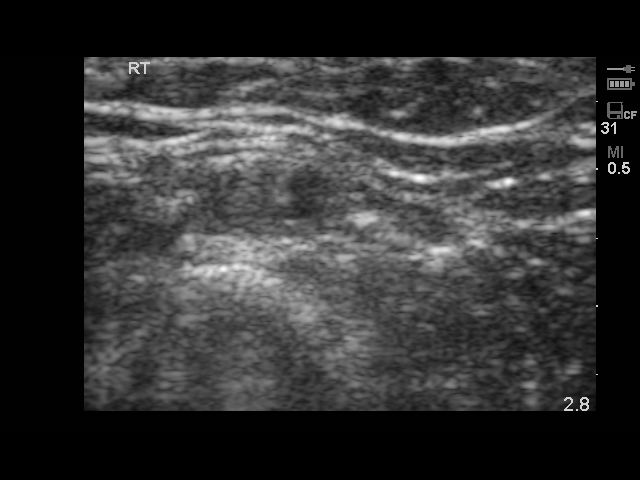

[1 of 1 positions shown; findings below may reference images not displayed]

PICC LINE PLACEMENT WITH ULTRASOUND AND FLUOROSCOPIC  GUIDANCE

Fluoroscopy Time: 0.5 minutes.

The right  arm was prepped with chlorhexidine, draped in the usual
sterile fashion using maximum barrier technique (cap and mask,
sterile gown, sterile gloves, large sterile sheet, hand hygiene and
cutaneous antisepsis) and infiltrated locally with 1% Lidocaine.

Ultrasound demonstrated patency of the right basilic vein, and this
was documented with an image.  Under real-time ultrasound guidance,
this vein was accessed with a 21 gauge micropuncture needle and
image documentation was performed.  The needle was exchanged over a
guidewire for a peel-away sheath through which a five French double
lumen PICC trimmed to 38 cm was advanced, positioned with its tip
at the lower SVC/right atrial junction.  Fluoroscopy during the
procedure and fluoro spot radiograph confirms appropriate catheter
position.  The catheter was flushed, secured to the skin with
Prolene sutures, and covered with a sterile dressing.

Complications:  None
IMPRESSION: Successful right arm PICC line placement with ultrasound and
fluoroscopic guidance.  The catheter is ready for use.

Read by: Mojapelo, Da-King.-WU

IR RIGHT FLOURO GUIDE CV LINE
FINDINGS: 
IMPRESSION:

## 2014-01-06 IMAGING — CT CT CHEST W/ CM
2 of 5 series · 17 of 46 positions shown, 19 images · IV contrast ([ID] [id number])
Comparison: CT 12/03/2010

CT CHEST

CLINICAL DATA: Gestational trophoblastic disease.  Prior molar
pregnancy.  Elevated tumor markers.

CT CHEST, ABDOMEN AND PELVIS WITH CONTRAST
TECHNIQUE: Multidetector CT imaging of the chest, abdomen and
pelvis was performed following the standard protocol during bolus
administration of intravenous contrast.
Contrast: 100mL OMNIPAQUE IOHEXOL 300 MG/ML IV SOLN

[Series 2: cap with st · axial · 0.80mm/px · z∈[-594,-44]mm · 14 of 126 slices shown, 16 images]
[im 8/126  soft-tissue]
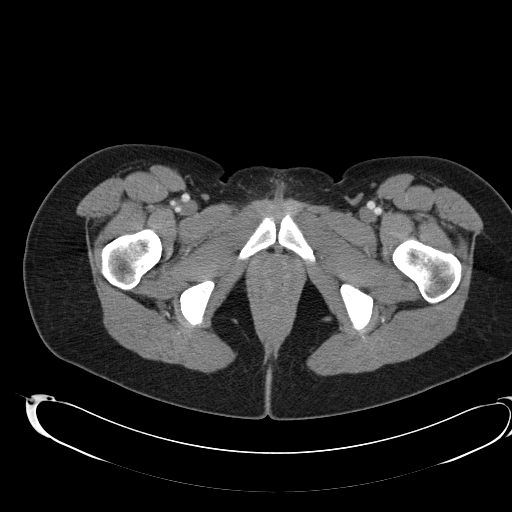
[im 8/126  bone]
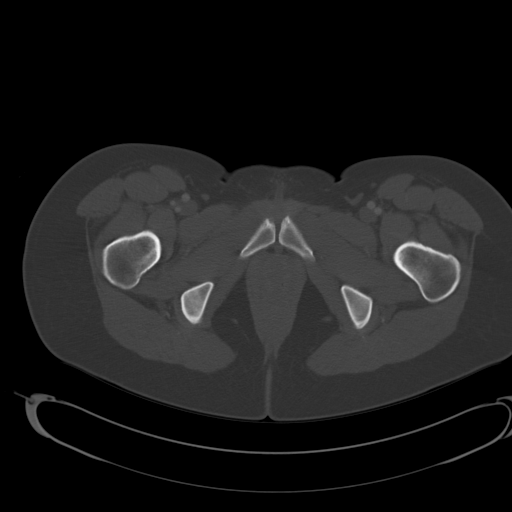
[im 15/126  soft-tissue]
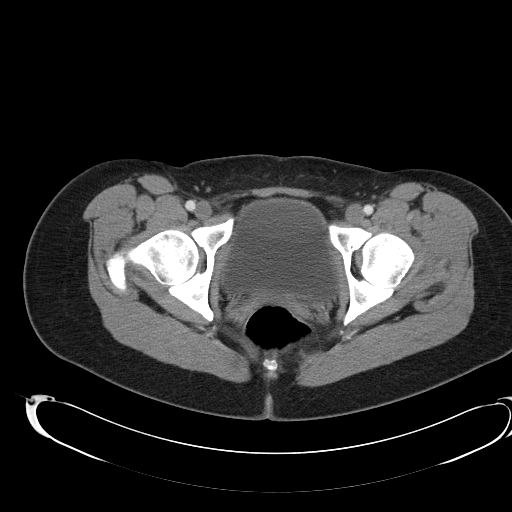
[im 23/126  soft-tissue]
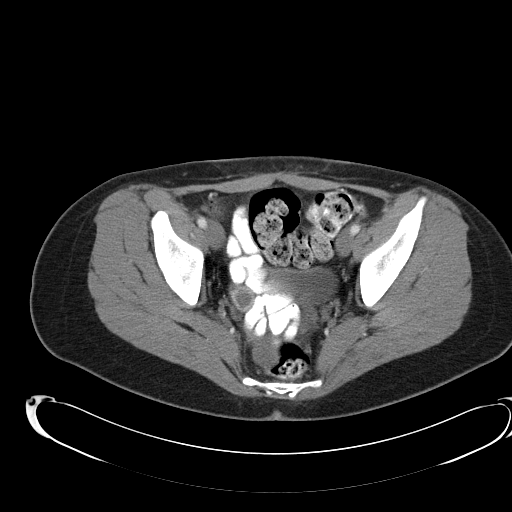
[im 37/126  soft-tissue]
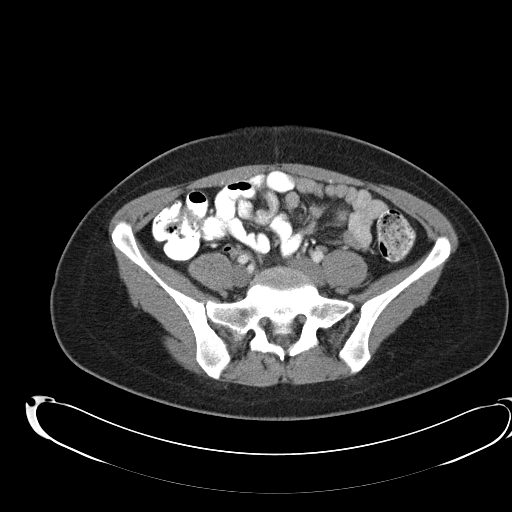
[im 45/126  soft-tissue]
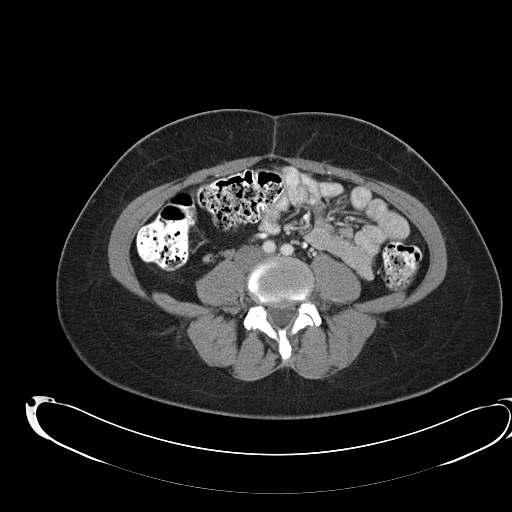
[im 52/126  soft-tissue]
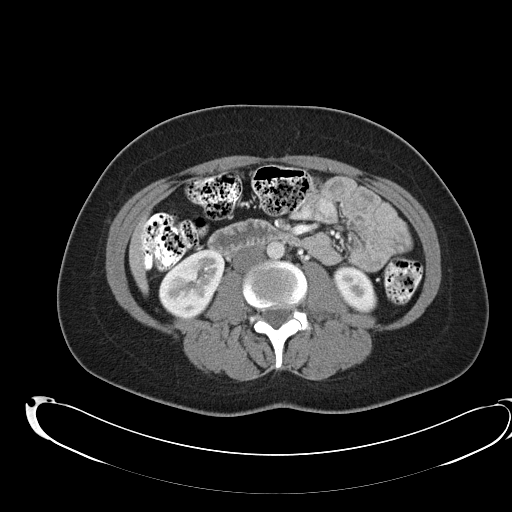
[im 59/126  soft-tissue]
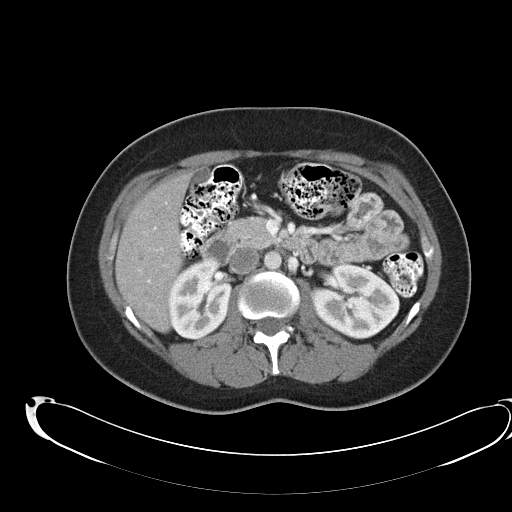
[im 67/126  soft-tissue]
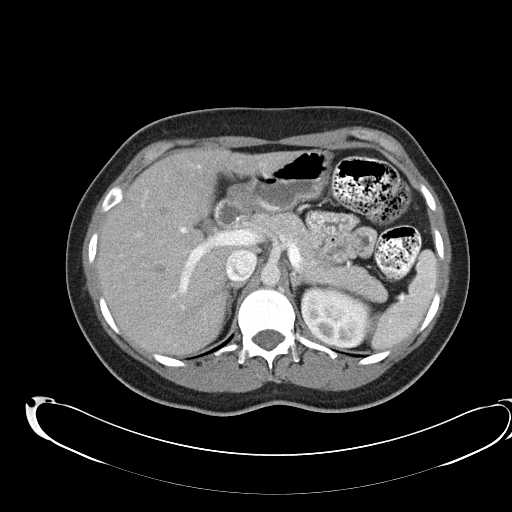
[im 74/126  soft-tissue]
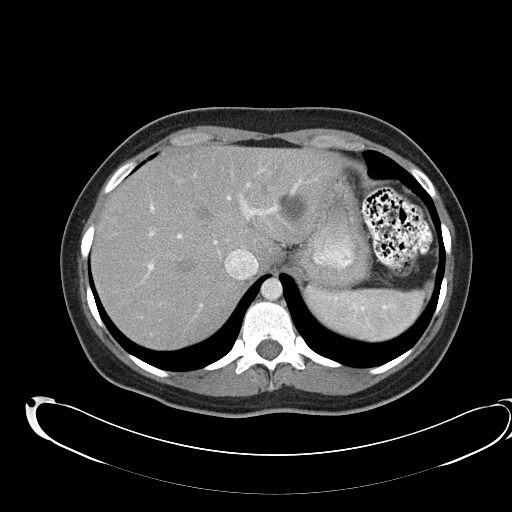
[im 74/126  bone]
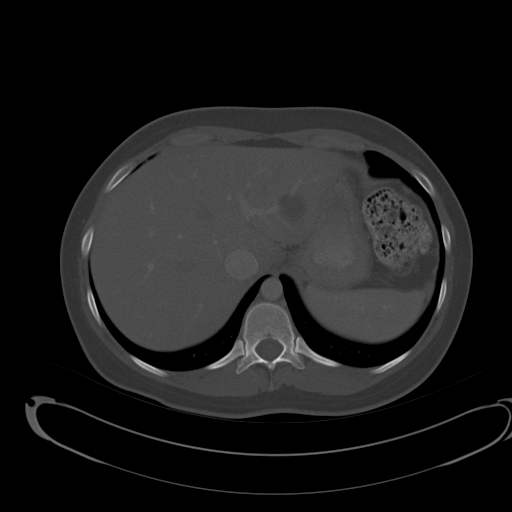
[im 81/126  soft-tissue]
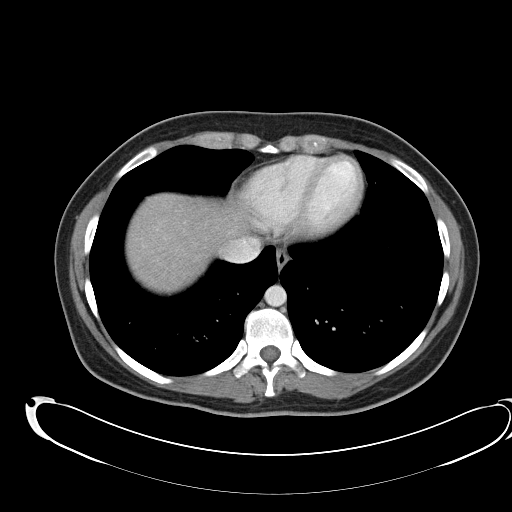
[im 96/126  soft-tissue]
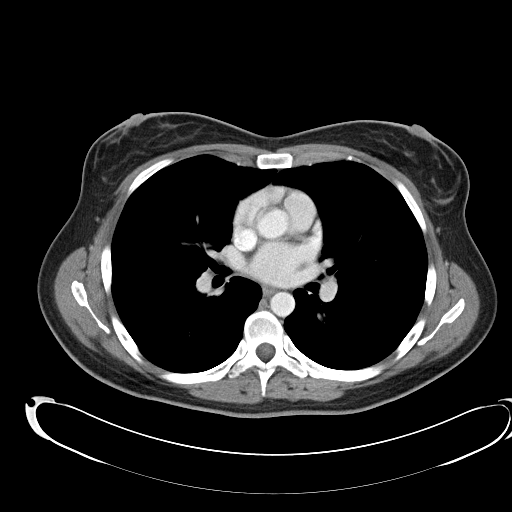
[im 103/126  soft-tissue]
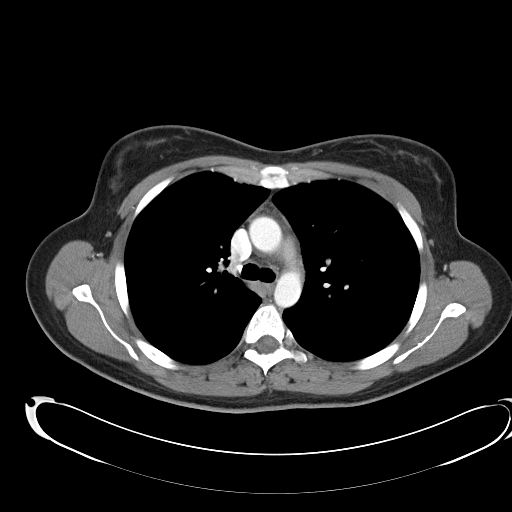
[im 111/126  soft-tissue]
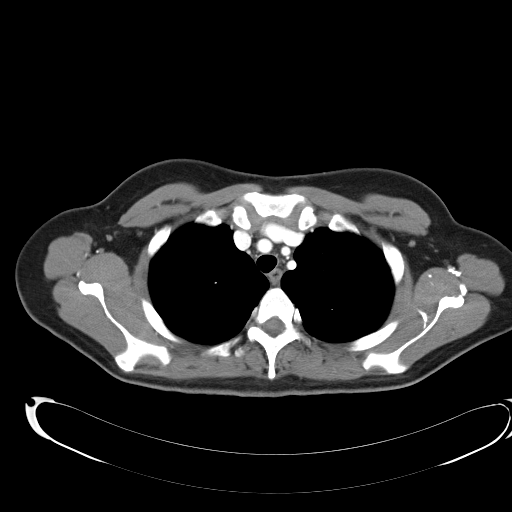
[im 118/126  soft-tissue]
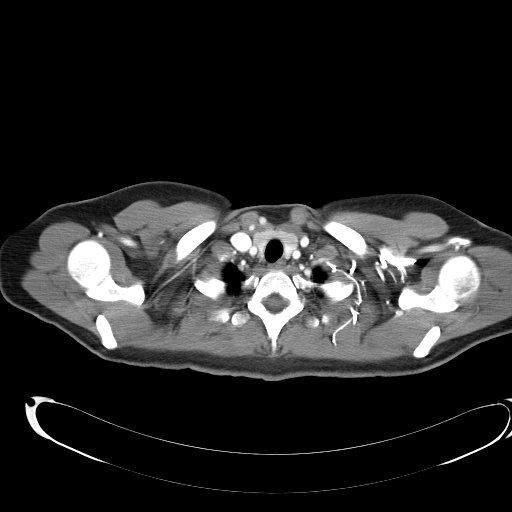

[Series 602: coronal images · coronal · 1.23mm/px · 3 of 88 slices shown]
[im 30/88  soft-tissue]
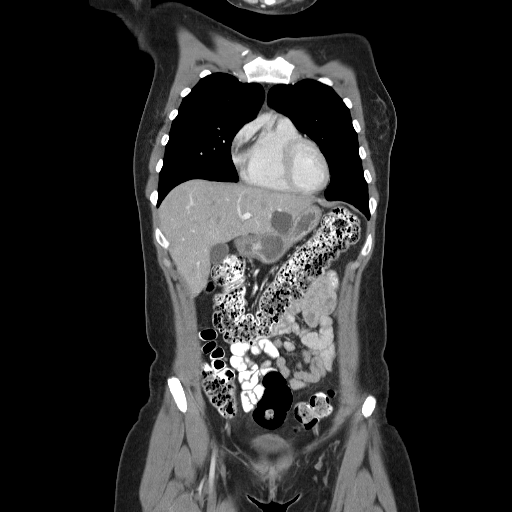
[im 39/88  soft-tissue]
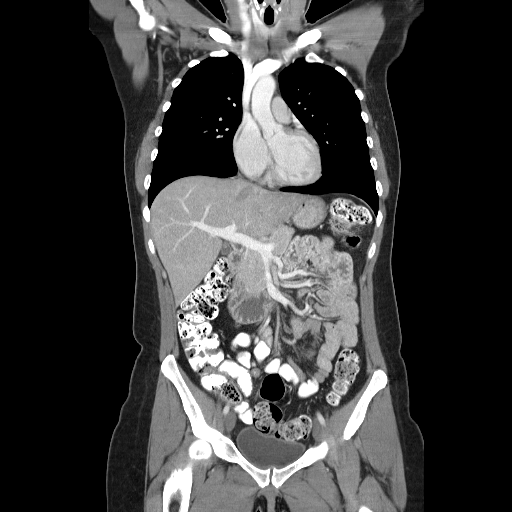
[im 49/88  soft-tissue]
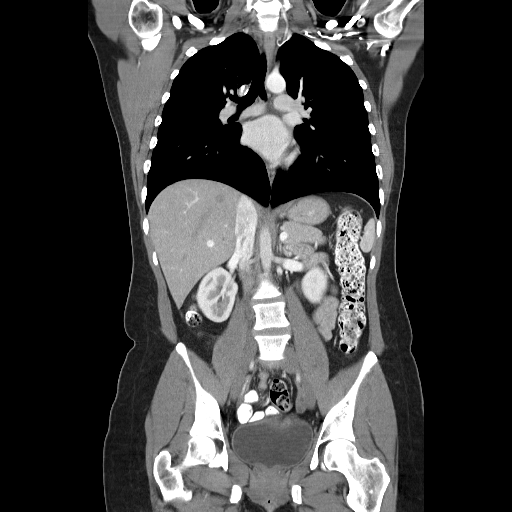

[17 of 46 positions shown; findings below may reference images not displayed]

FINDINGS: No axillary or supraclavicular lymphadenopathy.  No
mediastinal or hilar lymphadenopathy.  No pericardial fluid.
Esophagus is normal.

Review of the lung parenchyma demonstrates no new or suspicious
pulmonary nodules.
IMPRESSION: Stable exam of the chest.  No evidence of metastasis.

CT ABDOMEN AND PELVIS
FINDINGS: Stable low attenuation hepatic cyst within the left
lateral hepatic lobe measures 34 mm not changed from 33 on prior.
Gallbladder, pancreas, spleen, adrenal glands, and kidneys are
normal.

Stomach, small bowel, appendix, and colon are normal.

No evidence of peritoneal disease.  No retroperitoneal periportal
lymphadenopathy.

In the pelvis, interval hysterectomy.  No evidence of pelvic
lymphadenopathy.  The ovaries are normal.  There is a corpus luteal
cyst within the right ovary.  Small amount free fluid the posterior
cul-de-sac.

Review of  bone windows demonstrates no aggressive osseous lesions.
IMPRESSION: 1..  Interval hysterectomy.  Ovaries appear normal.
2.  No evidence of metastasis within the abdomen or pelvis.

## 2014-02-04 ENCOUNTER — Encounter: Payer: 59 | Admitting: Family Medicine

## 2014-02-26 ENCOUNTER — Other Ambulatory Visit: Payer: Self-pay | Admitting: Family Medicine

## 2014-02-26 ENCOUNTER — Other Ambulatory Visit (INDEPENDENT_AMBULATORY_CARE_PROVIDER_SITE_OTHER): Payer: 59

## 2014-02-26 DIAGNOSIS — Z0000 Encounter for general adult medical examination without abnormal findings: Secondary | ICD-10-CM

## 2014-02-26 DIAGNOSIS — Z01419 Encounter for gynecological examination (general) (routine) without abnormal findings: Secondary | ICD-10-CM

## 2014-02-26 LAB — CBC WITH DIFFERENTIAL/PLATELET
BASOS PCT: 0.7 % (ref 0.0–3.0)
Basophils Absolute: 0 10*3/uL (ref 0.0–0.1)
EOS PCT: 1.9 % (ref 0.0–5.0)
Eosinophils Absolute: 0.1 10*3/uL (ref 0.0–0.7)
HEMATOCRIT: 38.3 % (ref 36.0–46.0)
HEMOGLOBIN: 13.3 g/dL (ref 12.0–15.0)
LYMPHS PCT: 31.6 % (ref 12.0–46.0)
Lymphs Abs: 1.6 10*3/uL (ref 0.7–4.0)
MCHC: 34.6 g/dL (ref 30.0–36.0)
MCV: 87.8 fl (ref 78.0–100.0)
MONO ABS: 0.4 10*3/uL (ref 0.1–1.0)
Monocytes Relative: 8.1 % (ref 3.0–12.0)
Neutro Abs: 2.9 10*3/uL (ref 1.4–7.7)
Neutrophils Relative %: 57.7 % (ref 43.0–77.0)
PLATELETS: 198 10*3/uL (ref 150.0–400.0)
RBC: 4.36 Mil/uL (ref 3.87–5.11)
RDW: 13 % (ref 11.5–15.5)
WBC: 5.1 10*3/uL (ref 4.0–10.5)

## 2014-02-26 LAB — LIPID PANEL
CHOLESTEROL: 149 mg/dL (ref 0–200)
HDL: 48.3 mg/dL (ref >39.00)
LDL CALC: 84 mg/dL (ref 0–99)
NonHDL: 100.7
TRIGLYCERIDES: 84 mg/dL (ref 0.0–149.0)
Total CHOL/HDL Ratio: 3
VLDL: 16.8 mg/dL (ref 0.0–40.0)

## 2014-02-26 LAB — COMPREHENSIVE METABOLIC PANEL
ALBUMIN: 4.1 g/dL (ref 3.5–5.2)
ALT: 15 U/L (ref 0–35)
AST: 18 U/L (ref 0–37)
Alkaline Phosphatase: 50 U/L (ref 39–117)
BILIRUBIN TOTAL: 0.5 mg/dL (ref 0.2–1.2)
BUN: 9 mg/dL (ref 6–23)
CHLORIDE: 107 meq/L (ref 96–112)
CO2: 27 mEq/L (ref 19–32)
CREATININE: 0.69 mg/dL (ref 0.40–1.20)
Calcium: 8.9 mg/dL (ref 8.4–10.5)
GFR: 102.92 mL/min (ref >60.00)
Glucose, Bld: 99 mg/dL (ref 70–99)
POTASSIUM: 3.9 meq/L (ref 3.5–5.1)
SODIUM: 140 meq/L (ref 135–145)
TOTAL PROTEIN: 7.1 g/dL (ref 6.0–8.3)

## 2014-02-26 LAB — TSH: TSH: 2.93 u[IU]/mL (ref 0.35–4.50)

## 2014-03-03 ENCOUNTER — Encounter: Payer: Self-pay | Admitting: Family Medicine

## 2014-03-03 ENCOUNTER — Ambulatory Visit (INDEPENDENT_AMBULATORY_CARE_PROVIDER_SITE_OTHER): Payer: 59 | Admitting: Family Medicine

## 2014-03-03 VITALS — BP 110/70 | HR 73 | Temp 98.0°F | Ht 66.0 in | Wt 178.5 lb

## 2014-03-03 DIAGNOSIS — D392 Neoplasm of uncertain behavior of placenta: Secondary | ICD-10-CM

## 2014-03-03 DIAGNOSIS — Z01419 Encounter for gynecological examination (general) (routine) without abnormal findings: Secondary | ICD-10-CM

## 2014-03-03 DIAGNOSIS — Z1509 Genetic susceptibility to other malignant neoplasm: Secondary | ICD-10-CM

## 2014-03-03 DIAGNOSIS — R635 Abnormal weight gain: Secondary | ICD-10-CM

## 2014-03-03 DIAGNOSIS — Z0000 Encounter for general adult medical examination without abnormal findings: Secondary | ICD-10-CM

## 2014-03-03 DIAGNOSIS — F4321 Adjustment disorder with depressed mood: Secondary | ICD-10-CM

## 2014-03-03 DIAGNOSIS — Z1501 Genetic susceptibility to malignant neoplasm of breast: Secondary | ICD-10-CM

## 2014-03-03 NOTE — Progress Notes (Signed)
Pre visit review using our clinic review tool, if applicable. No additional management support is needed unless otherwise documented below in the visit note. 

## 2014-03-03 NOTE — Progress Notes (Signed)
Subjective:   Patient ID: [name], female    DOB: [birth year], 34 y.o.   MRN: [id number]  [name] is a pleasant 33 y.o. year old female who presents to clinic today with Annual Exam  on 03/03/2014  HPI:  Followed by Dr. Alycia Rossetti- h/o GTD and BRCA mutation. Remote h/o hysterectomy. Last saw hr on 08/16/23- note reviewed- no evidence of recurrent disease.  She did discuss BSO prior to age 13- she is thinking about it. Neg ultrasound in 10/2013. Neg mammogram in 2013/05/19.  Mom died of ovarian CA 2 years ago.  Feels in some ways she is more sad about it now than right after she died.  She had a sense of relief that she was no longer suffering initially since her mom lived with her and she witnessed what she went through.    Feels she has gained weight due to this- eating more "junk food" and not exercising.  She denies feeling depressed or anxious. She does not feel comfortable with her current weight.  Wt Readings from Last 3 Encounters:  03/03/14 178 lb 8 oz (80.967 kg)  10/28/13 175 lb 9.6 oz (79.652 kg)  08/15/13 170 lb 6.4 oz (77.293 kg)     Lab Results  Component Value Date   CHOL 149 02/26/2014   HDL 48.30 02/26/2014   LDLCALC 84 02/26/2014   TRIG 84.0 02/26/2014   CHOLHDL 3 02/26/2014   Lab Results  Component Value Date   CREATININE 0.69 02/26/2014   Lab Results  Component Value Date   TSH 2.93 02/26/2014   Lab Results  Component Value Date   WBC 5.1 02/26/2014   HGB 13.3 02/26/2014   HCT 38.3 02/26/2014   MCV 87.8 02/26/2014   PLT 198.0 02/26/2014   Current Outpatient Prescriptions on File Prior to Visit  Medication Sig Dispense Refill  . ibuprofen (ADVIL,MOTRIN) 200 MG tablet Take 400 mg by mouth daily as needed. For pain.     . Multiple Vitamins-Minerals (MULTIVITAMIN PO) Take 1 tablet by mouth daily.     No current facility-administered medications on file prior to visit.    No Known Allergies  Past Medical History  Diagnosis Date  . Cancer       hydatidiform mole  . Gestational trophoblastic tumor, invasive mole 11/28/2010    Past Surgical History  Procedure Laterality Date  . Cesarean section  2004, 2006  . Dilation and evacuation  08/12/2010    Procedure: DILATATION AND EVACUATION (D&E);  Surgeon: Melina Schools, MD;  Location: [facility];  Service: Gynecology;  Laterality: N/A;  . Dilation and curettage of uterus    . Abdominal hysterectomy  12/17/2010    Family History  Problem Relation Age of Onset  . Diabetes Other   . Diabetes Other   . Coronary artery disease Other   . Ovarian cancer Mother 89  . BRCA 1/2 Mother     BRCA1 mutation  . Ovarian cancer Maternal Aunt 61  . Ovarian cancer Maternal Aunt     History   Social History  . Marital Status: Married    Spouse Name: N/A    Number of Children: N/A  . Years of Education: N/A   Occupational History  . Not on file.   Social History Main Topics  . Smoking status: Never Smoker   . Smokeless tobacco: Not on file  . Alcohol Use: Yes     Comment: rarely  . Drug Use: No  . Sexual Activity: Yes  Birth Control/ Protection: None   Other Topics Concern  . Not on file   Social History Narrative   The PMH, PSH, Social History, Family History, Medications, and allergies have been reviewed in [facility], and have been updated if relevant.    Review of Systems  Constitutional: Positive for unexpected weight change.  HENT: Negative.   Respiratory: Negative.   Cardiovascular: Negative.   Gastrointestinal: Negative.   Musculoskeletal: Negative.   Allergic/Immunologic: Negative.   Neurological: Negative.   Hematological: Negative.   Psychiatric/Behavioral: Positive for dysphoric mood. Negative for suicidal ideas, sleep disturbance and self-injury.  All other systems reviewed and are negative.      Objective:    BP 110/70 mmHg  Pulse 73  Temp(Src) 98 F (36.7 C) (Oral)  Ht 5' 6"  (1.676 m)  Wt 178 lb 8 oz (80.967 kg)  BMI 28.82 kg/m2  SpO2 97%  LMP  05/31/2010   Physical Exam   General:  Well-developed,well-nourished,in no acute distress; alert,appropriate and cooperative throughout examination Head:  normocephalic and atraumatic.   Eyes:  vision grossly intact, pupils equal, pupils round, and pupils reactive to light.   Ears:  R ear normal and L ear normal.   Nose:  no external deformity.   Mouth:  good dentition.   Neck:  No deformities, masses, or tenderness noted. Breasts:  No mass, nodules, thickening, tenderness, bulging, retraction, inflamation, nipple discharge or skin changes noted.   Lungs:  Normal respiratory effort, chest expands symmetrically. Lungs are clear to auscultation, no crackles or wheezes. Heart:  Normal rate and regular rhythm. S1 and S2 normal without gallop, murmur, click, rub or other extra sounds. Abdomen:  Bowel sounds positive,abdomen soft and non-tender without masses, organomegaly or hernias noted. Msk:  No deformity or scoliosis noted of thoracic or lumbar spine.   Extremities:  No clubbing, cyanosis, edema, or deformity noted with normal full range of motion of all joints.   Neurologic:  alert & oriented X3 and gait normal.   Skin:  Intact without suspicious lesions or rashes Cervical Nodes:  No lymphadenopathy noted Axillary Nodes:  No palpable lymphadenopathy Psych:  Tearful but appropriate       Assessment & Plan:   Well woman exam  Gestational trophoblastic tumor, invasive mole  BRCA1 positive No Follow-up on file.

## 2014-03-03 NOTE — Patient Instructions (Signed)
Good to see you. Hang in there. Please let me know if you would like to start Wellbutrin or see a therapist.

## 2014-03-03 NOTE — Assessment & Plan Note (Signed)
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 >15 minutes spent in face to face time with patient, >50% spent in counselling or coordination of care. She is deferring psychotherapy and rx at this time. We did discuss possibility of grief counseling and or wellbutrin if symptoms deteriorate. The patient indicates understanding of these issues and agrees with the plan.

## 2014-03-03 NOTE — Assessment & Plan Note (Signed)
New- admits to not eating well and not exercising. She plans to work on this.

## 2014-03-03 NOTE — Assessment & Plan Note (Signed)
Reviewed preventive care protocols, scheduled due services, and updated immunizations Discussed nutrition, exercise, diet, and healthy lifestyle.  

## 2014-03-25 ENCOUNTER — Telehealth: Payer: Self-pay | Admitting: Oncology

## 2014-03-25 NOTE — Telephone Encounter (Signed)
Pt called confirmed labs/ov sent pt to CM vm to have her call her back to go over information concerning MM..... KJ

## 2014-03-25 NOTE — Telephone Encounter (Signed)
pt cld to state wanted to r/s appt stating she would be on Spring Break-r/s appt and left time & date on pt vm

## 2014-04-28 ENCOUNTER — Ambulatory Visit: Payer: 59 | Admitting: Oncology

## 2014-04-28 ENCOUNTER — Other Ambulatory Visit: Payer: 59

## 2014-05-05 ENCOUNTER — Ambulatory Visit: Payer: 59

## 2014-05-07 ENCOUNTER — Ambulatory Visit
Admission: RE | Admit: 2014-05-07 | Discharge: 2014-05-07 | Disposition: A | Discharge status: Home / Self Care | Payer: 59 | Source: Ambulatory Visit | Attending: Oncology | Admitting: Oncology

## 2014-05-07 ENCOUNTER — Other Ambulatory Visit: Payer: Self-pay | Admitting: Oncology

## 2014-05-07 ENCOUNTER — Ambulatory Visit: Payer: 59

## 2014-05-07 DIAGNOSIS — Z1501 Genetic susceptibility to malignant neoplasm of breast: Secondary | ICD-10-CM

## 2014-05-07 DIAGNOSIS — Z1231 Encounter for screening mammogram for malignant neoplasm of breast: Secondary | ICD-10-CM

## 2014-05-07 DIAGNOSIS — Z1509 Genetic susceptibility to other malignant neoplasm: Principal | ICD-10-CM

## 2014-05-12 ENCOUNTER — Encounter: Payer: Self-pay | Admitting: Oncology

## 2014-05-12 ENCOUNTER — Ambulatory Visit (HOSPITAL_BASED_OUTPATIENT_CLINIC_OR_DEPARTMENT_OTHER): Payer: 59 | Admitting: Oncology

## 2014-05-12 ENCOUNTER — Telehealth: Payer: Self-pay | Admitting: Oncology

## 2014-05-12 ENCOUNTER — Other Ambulatory Visit (HOSPITAL_BASED_OUTPATIENT_CLINIC_OR_DEPARTMENT_OTHER): Payer: 59

## 2014-05-12 VITALS — BP 113/63 | HR 60 | Temp 98.7°F | Resp 18

## 2014-05-12 DIAGNOSIS — Z87898 Personal history of other specified conditions: Secondary | ICD-10-CM

## 2014-05-12 DIAGNOSIS — Z1501 Genetic susceptibility to malignant neoplasm of breast: Secondary | ICD-10-CM

## 2014-05-12 DIAGNOSIS — Z1509 Genetic susceptibility to other malignant neoplasm: Principal | ICD-10-CM

## 2014-05-12 NOTE — Telephone Encounter (Signed)
per pof to sch pt appt-adv pt that [facility] will call to sch pt appt/gave pt # to [place] to call and sch for creening prior to sch appt-gavwe #

## 2014-05-12 NOTE — Progress Notes (Signed)
OFFICE PROGRESS NOTE   May 12, 2014   Physicians:P.Gehrig, T.Marquette Saa (PCP [place])  INTERVAL HISTORY:  Patient is seen, alone for visit, in continuing attention to known BRCA 1 mutation, also previously treated for gestational trophoblastic disease. She had hysterectomy with bilateral salpingectomy at [facility] for GTD. Last pelvic US was 10-2013 and most recent bilateral mammograms were at [facility] 05-07-14 with scattered fibroglandular densities but no mammographic findings of concern. We plan breast MRI within 3 months of these mammograms. She is due back to Dr Alycia Rossetti. Patient seems more open to oophorectomy now.  Patient's family members have not been very willing to consider genetics testing. Her mother died with ovarian carcinosarcoma, at which time the BRCA diagnosis was found; since [name] was here last, a maternal first cousin was diagnosed with metastatic breast cancer at age 71.   [name] did not have discussion with genetics counselors directly about the BRCA finding, tho she spoke with them by phone. She would like to have full discussion, which seems very appropriate, and also wonders if there is any chance of error in the testing, ie if this should be sent again to a different lab (lab chosen was due to her mother's insurance status, then Nethra's testing sent to the same lab). 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    Patient has felt generally very well since she was here last. She has no hot flashes, some minimal symptoms from cyclic hormonal changes now. She denies abdominal or pelvic discomfort or any changes in breasts.   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 Children are well.  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 Per Dr.Gehrig's subsequent consult note, WHO  score was 7 at diagnosis. B HCGs were followed closely but did not normalize and she was begun on methotrexate in August 2012. She had CT head+ CAP at [facility] in August. The marker progressively dropped until mid October, when treatment was changed to bolus actinomycinD at 1.25 mg/m2, single treatment given at [facility] on 11-22-10. Repeat CT Head +CAP was done at [facility]11-[facility]-12 with stable 3.2 cm simple cyst in lateral left hepatic lobe and stable small low density lesion in central liver, with enlarging hypervascular mass in endometrial canal involving right wall of uterus, no evidence of metastatic disease. 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 She was given one pre-op dose of MTX day of surgery. B HCG dropped from 5919 to 6.4 by 02-16-11, then plateau'd (serial dilutions done and confirmed HCG). She had CT CAP on 03-15-11, with chest unremarkable, stable left hepatic cyst, no pelvic adenopathy, ovaries not remarkable with corpus luteal cyst, no CT evidence of metastatic disease. She was begun on actinomycin D 12 mcg/kg/day x5 q 2 weeks on 03-13-2011, with BHCG 11.9 on 03-14-11 and 21.1 on 03-21-11, but subsequently down to normal range (2.3) by 04-11-11. Cycle 4 (day 1 on 04-25-11) was the second full cycle beyond normalization of marker.  After gestational trophoblastic disease was resolved, patient's mother developed ovarian carcinosarcoma and was found to be BRCA 1 positive; [name] was also tested and is BRCA 1 positive.    Review of systems as above, also: No respiratory or GI complaints.  Good energy, good appetite. Wants to lose weight. Remainder of 10 point Review of Systems negative.  Objective:  Vital signs in last 24 hours:  BP 113/63 mmHg  Pulse 60  Temp(Src) 98.7 F (37.1 C) (Oral)  Resp 18  Wt   LMP 05/31/2010  Alert,  oriented and appropriate. Easily mobile, looks comfortable, very pleasant as always.   HEENT:PERRL, sclerae not icteric. Oral mucosa moist without lesions, posterior pharynx clear.  Neck supple. No JVD.  Lymphatics:no cervical,supraclavicular, axillary or inguinal adenopathy Resp: clear to auscultation bilaterally and normal percussion bilaterally Cardio: regular rate and rhythm. No gallop. GI: soft, nontender, not distended, no mass or organomegaly. Normally active bowel sounds.  Musculoskeletal/ Extremities: without pitting edema, cords, tenderness Neuro: nonfocal. PSYCH appropriate mood and affect Skin without rash, ecchymosis, petechiae Breasts: bilaterally without dominant mass, skin or nipple findings. Axillae benign.   Lab Results:  Results for orders placed or performed in visit on 02/26/14  CBC with Differential/Platelet  Result Value Ref Range   WBC 5.1 4.0 - 10.5 K/uL   RBC 4.36 3.87 - 5.11 Mil/uL   Hemoglobin 13.3 12.0 - 15.0 g/dL   HCT 38.3 36.0 - 46.0 %   MCV 87.8 78.0 - 100.0 fl   MCHC 34.6 30.0 - 36.0 g/dL   RDW 13.0 11.5 - 15.5 %   Platelets 198.0 150.0 - 400.0 K/uL   Neutrophils Relative % 57.7 43.0 - 77.0 %   Lymphocytes Relative 31.6 12.0 - 46.0 %   Monocytes Relative 8.1 3.0 - 12.0 %   Eosinophils Relative 1.9 0.0 - 5.0 %   Basophils Relative 0.7 0.0 - 3.0 %   Neutro Abs 2.9 1.4 - 7.7 K/uL   Lymphs Abs 1.6 0.7 - 4.0 K/uL   Monocytes Absolute 0.4 0.1 - 1.0 K/uL   Eosinophils Absolute 0.1 0.0 - 0.7 K/uL   Basophils Absolute 0.0 0.0 - 0.1 K/uL  Comprehensive metabolic panel  Result Value Ref Range   Sodium 140 135 - 145 mEq/L   Potassium 3.9 3.5 - 5.1 mEq/L   Chloride 107 96 - 112 mEq/L   CO2 27 19 - 32 mEq/L   Glucose, Bld 99 70 - 99 mg/dL   BUN 9 6 - 23 mg/dL   Creatinine, Ser 0.69 0.40 - 1.20 mg/dL   Total Bilirubin 0.5 0.2 - 1.2 mg/dL   Alkaline Phosphatase 50 39 - 117 U/L   AST 18 0 - 37 U/L   ALT 15 0 - 35 U/L   Total Protein 7.1 6.0 - 8.3  g/dL   Albumin 4.1 3.5 - 5.2 g/dL   Calcium 8.9 8.4 - 10.5 mg/dL   GFR 102.92 >60.00 mL/min  Lipid panel  Result Value Ref Range   Cholesterol 149 0 - 200 mg/dL   Triglycerides 84.0 0.0 - 149.0 mg/dL   HDL 48.30 >39.00 mg/dL   VLDL 16.8 0.0 - 40.0 mg/dL   LDL Cholesterol 84 0 - 99 mg/dL   Total CHOL/HDL Ratio 3    NonHDL 100.70   TSH  Result Value Ref Range   TSH 2.93 0.35 - 4.50 uIU/mL    CA 125 available after visit 13, this having been 8 in 10-2013 and 6.7 in 07-2013.  Studies/Results: EXAM: DIGITAL SCREENING BILATERAL MAMMOGRAM WITH CAD  COMPARISON: Previous exam(s).  ACR Breast Density Category b: There are scattered areas of fibroglandular density.  FINDINGS: There are no findings suspicious for malignancy. Images were processed with CAD.  IMPRESSION: No mammographic evidence of malignancy.  A result letter of this screening mammogram will be mailed directly to the patient.  RECOMMENDATION: Screening mammogram at age 66. (Code:[id number])  BI-RADS CATEGORY 1: Negative  Medications: I have reviewed the patient's current medications. BCP prophylaxis for ovarian risk may increase risk of breast cancer with BRCA mutation.   DISCUSSION: Direct discussion with genetics counselor very reasonable, which can include consideration of repeat testing. Will request pelvic US now and needs follow up with Dr Alycia Rossetti.We will let patient know result of CA 125, doubt significant but needs to be followed . Will get breast MRI late June or early July, to be within 3 months of 05-07-14 mammograms. I will see her back in 6 months. Patient is concerned about hot flashes after oophorectomy, discussed. I have told her that there are non-hormonal interventions if needed.  Assessment/Plan:  1.BRCA 1 positive 34 yo lady: Strong FH ovarian and breast ca in maternal relatives including recent diagnosis of metastatic breast in 35 yo maternal first cousin. Following closely until she hopefully  proceeds with prophylactic oophorectomy and bilateral mastectomies. Appointment with genetics counselor requested. Pelvic US and breast MRI ordered. CA 125 slightly higher but still well in normal range.   2.gestational trophoblastic disease 2012, treated with MTX x 1 prior to hysterectomy with bilateral salpingectomy then 4 cycles actinomycin D (=2 beyond normalization of marker) thru 04-2011. She had CR, and  was followed with serial BHCGs x 1 year without concerns.    All questions addressed. Patient is comfortable with plans above and knows that she can call at any time prior to next scheduled appointment if needed. TIme spent 25 min including >50% counseling and coordination of care.  Dawn Kiper P, MD   05/12/2014, 12:30 PM

## 2014-05-13 ENCOUNTER — Telehealth: Payer: Self-pay

## 2014-05-13 LAB — CA 125: CA 125: 13 U/mL (ref <35)

## 2014-05-13 NOTE — Telephone Encounter (Signed)
Told Ms. Madonia the results of the CA-125 as noted below by Dr. Marko Plume.  Ms. Kashani verbalized understanding.

## 2014-05-13 NOTE — Telephone Encounter (Signed)
-----   Message from Gordy Levan, MD sent at 05/13/2014  8:07 AM EDT ----- Labs seen and need follow up ;Please let her know CA 125 resulted at 13, still well in normal range tho just slightly up from Sept. Will get pelvic US and see Dr Alycia Rossetti as planned.

## 2014-05-26 ENCOUNTER — Ambulatory Visit (HOSPITAL_COMMUNITY): Payer: 59

## 2014-05-29 ENCOUNTER — Other Ambulatory Visit: Payer: 59

## 2014-05-29 ENCOUNTER — Other Ambulatory Visit: Payer: 59 | Admitting: Oncology

## 2014-05-29 ENCOUNTER — Encounter: Payer: Self-pay | Admitting: Genetic Counselor

## 2014-05-29 ENCOUNTER — Ambulatory Visit (HOSPITAL_BASED_OUTPATIENT_CLINIC_OR_DEPARTMENT_OTHER): Payer: 59 | Admitting: Genetic Counselor

## 2014-05-29 ENCOUNTER — Ambulatory Visit (HOSPITAL_COMMUNITY)
Admission: RE | Admit: 2014-05-29 | Discharge: 2014-05-29 | Disposition: A | Discharge status: Home / Self Care | Payer: 59 | Source: Ambulatory Visit | Attending: Oncology | Admitting: Oncology

## 2014-05-29 DIAGNOSIS — Z1501 Genetic susceptibility to malignant neoplasm of breast: Secondary | ICD-10-CM | POA: Diagnosis not present

## 2014-05-29 DIAGNOSIS — Z1509 Genetic susceptibility to other malignant neoplasm: Principal | ICD-10-CM

## 2014-05-29 DIAGNOSIS — Z315 Encounter for genetic counseling: Secondary | ICD-10-CM | POA: Diagnosis not present

## 2014-05-29 DIAGNOSIS — Z8041 Family history of malignant neoplasm of ovary: Secondary | ICD-10-CM | POA: Diagnosis not present

## 2014-05-29 NOTE — Progress Notes (Signed)
Patient Name: [name] Patient Age: 34 y.o. Encounter Date: 05/29/2014  Referring Physician: Evlyn Clines, MD  Primary Care Provider: Arnette Norris, MD    HISTORY OF PRESENT ILLNESS:  [name], a 34 y.o. female, is being seen at the [facility] to review her genetic test results from 2014 showing she has a pathogenic BRCA1 mutation. She was seen by Roma Kayser, MS, [place] on 02/02/12 to discuss her family history of cancer and to undergo genetic testing. At that time, she had single-site testing for the familial BRCA1 mutation that was found in her mother. This mutation is called BRCA1 [id number] ([id number]) and her testing was through [employer] where her mother had testing.  [name] stated that she undergoes the recommended screenings for women with a BRCA mutation. She had a mammogram and is scheduled for a breast MRI in June. She undergoes a transvaginal ultrasound and CA-125 blood test every 6 months.  Of note, [name] has had a hysterectomy in 2012 with removal of her fallopian tubes. This was due to a molar pregnancy. Her ovaries remain intact.   Past Medical History  Diagnosis Date  . Cancer     hydatidiform mole  . 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 Family history of ovarian cancer   . BRCA1 positive     Past Surgical History  Procedure Laterality Date  . Cesarean section  2004, 2006  . Dilation and evacuation  08/12/2010    Procedure: DILATATION AND EVACUATION (D&E);  Surgeon: Melina Schools, MD;  Location: [facility];  Service: Gynecology;  Laterality: N/A;  . Dilation and curettage of uterus    . Abdominal hysterectomy  12/17/2010    History   Social History  . Marital Status: Married    Spouse Name: N/A  . Number of Children: N/A  . Years of Education: N/A   Social History Main Topics  . Smoking status: Never Smoker   . Smokeless tobacco: Not on file  . Alcohol Use: Yes     Comment: rarely  . Drug  Use: No  . Sexual Activity: Yes    Birth Control/ Protection: None   Other Topics Concern  . Not on file   Social History Narrative     FAMILY HISTORY:   During the visit, a 4-generation pedigree was updated. Family tree will be sent for scanning and will be in EPIC under the Media tab.  Significant diagnoses include the following:  Family History  Problem Relation Age of Onset  . Diabetes Other   . Diabetes Other   . Coronary artery disease Other   . Ovarian cancer Mother 58  . BRCA 1/2 Mother     BRCA1 mutation  . Ovarian cancer Maternal Aunt 54  . Breast cancer Cousin 49  . Cancer Other     "abdominal ca" in 3 sisters of mat grandfather    Additionally, [name] has a daughter (age 90) and a son (age 44). She has a sister (age 71) and a brother (age 78). She is not close to her siblings and does not know whether they have undergone genetic testing. Her mother had 80 other sisters and 3 brothers. Most live in [place] and she is not sure they have had testing. Her father is 72 and cancer-free, as are his 4 sisters and 2 brothers.  [name] ancestry is [place] and [place]. There is no known Jewish ancestry and no consanguinity.  ASSESSMENT AND PLAN: Ms.  Fitzpatrick is a 34 y.o. female with a pathogenic BRCA1 mutation and a family history of breast and ovarian cancers that is consistent with Hereditary Breast Ovarian Cancer syndrome. We reviewed the characteristics, features and inheritance patterns of HBOC. We also discussed who else in the family should pursue testing and she understood that her siblings and children each has a 50% chance to have this mutation. We discussed that children should not be tested as this would not impact their medical management. They should speak with a genetic counselor by their early 46s. We reviewed the NCCN (V2.2016) recommendations for screening in those with a BRCA mutation. She was given a copy of these guidelines to share with healthcare  providers and family members.   No additional genetic testing was recommended today. [name] raised a concern about whether her result could be inaccurate. We discussed the very unlikely event of a false positive and she seemed more reassured. She can choose to get retested if she would like, but understands that her insurance company will not likely pay for it.  We encouraged [name] to remain in contact with Cancer Genetics annually so that we can update the family history and inform her of any changes in cancer genetics and testing that may be of benefit for this family. Ms.  Fitzpatrick questions were answered to her satisfaction today.   Thank you for the referral and allowing [place] to share in the care of your patient.   The patient was seen for a total of 40 minutes, greater than 50% of which was spent face-to-face counseling. This patient was discussed with the overseeing provider who agrees with the above.   Steele Berg, MS, [place] Certified Genetic Counselor phone: [phone] [email or web address]

## 2014-06-09 ENCOUNTER — Encounter: Payer: Self-pay | Admitting: Gynecologic Oncology

## 2014-06-10 ENCOUNTER — Telehealth: Payer: Self-pay | Admitting: *Deleted

## 2014-06-10 NOTE — Telephone Encounter (Signed)
Per Dr. Denman George, patient notified that the ultrasound from 05/29/14 showed a simple cyst. Dr. Denman George recommends repeating the ultrasound in 6 months if she is not planning on having her ovaries removed in the near future. Patient states she would like to go ahead and have them removed this fall in either October or November. Confirmed f/u appt for 07/01/14 with Dr. Alycia Rossetti at 11:15 with patient while on the phone.

## 2014-07-01 ENCOUNTER — Encounter: Payer: Self-pay | Admitting: Gynecologic Oncology

## 2014-07-01 ENCOUNTER — Ambulatory Visit: Payer: 59 | Attending: Gynecologic Oncology | Admitting: Gynecologic Oncology

## 2014-07-01 VITALS — BP 118/72 | HR 66 | Temp 98.5°F | Resp 16 | Ht 66.0 in | Wt 179.1 lb

## 2014-07-01 DIAGNOSIS — Z8742 Personal history of other diseases of the female genital tract: Secondary | ICD-10-CM | POA: Diagnosis not present

## 2014-07-01 DIAGNOSIS — Z803 Family history of malignant neoplasm of breast: Secondary | ICD-10-CM | POA: Diagnosis not present

## 2014-07-01 DIAGNOSIS — N839 Noninflammatory disorder of ovary, fallopian tube and broad ligament, unspecified: Secondary | ICD-10-CM | POA: Insufficient documentation

## 2014-07-01 DIAGNOSIS — Z8041 Family history of malignant neoplasm of ovary: Secondary | ICD-10-CM | POA: Diagnosis not present

## 2014-07-01 DIAGNOSIS — Z1501 Genetic susceptibility to malignant neoplasm of breast: Secondary | ICD-10-CM | POA: Diagnosis not present

## 2014-07-01 DIAGNOSIS — Z1509 Genetic susceptibility to other malignant neoplasm: Secondary | ICD-10-CM

## 2014-07-01 DIAGNOSIS — O019 Hydatidiform mole, unspecified: Secondary | ICD-10-CM

## 2014-07-01 DIAGNOSIS — Z90710 Acquired absence of both cervix and uterus: Secondary | ICD-10-CM | POA: Insufficient documentation

## 2014-07-01 DIAGNOSIS — Z9079 Acquired absence of other genital organ(s): Secondary | ICD-10-CM | POA: Insufficient documentation

## 2014-07-01 NOTE — Patient Instructions (Addendum)
We have you scheduled for surgery on October 22, 2014 at 8:30 at [facility] are scheduled for laparoscopic bilateral oophorectomy (removal of ovaries).                Preparing for your Surgery  Plan for surgery on October 22, 2014 with Dr. Alycia Rossetti.  Pre-operative Testing -You will receive a phone call from presurgical testing at [facility] to arrange for a pre-operative testing appointment before your surgery.  This appointment normally occurs one to two weeks before your scheduled surgery.   -Bring your insurance card, copy of an advanced directive if applicable, medication list  -At that visit, you will be asked to sign a consent for a possible blood transfusion in case a transfusion becomes necessary during surgery.  The need for a blood transfusion is rare but having consent is a necessary part of your care.     -You should not be taking blood thinners or aspirin at least ten days prior to surgery unless instructed by your surgeon.  Day Before Surgery at [facility] will be asked to take in only clear liquids the day before surgery.  Examples of clear liquids include broths, jello, and clear juices.  You will be advised to have nothing to eat or drink after midnight the evening before.    Your role in recovery Your role is to become active as soon as directed by your doctor, while still giving yourself time to heal.  Rest when you feel tired. You will be asked to do the following in order to speed your recovery:  - Cough and breathe deeply. This helps toclear and expand your lungs and can prevent pneumonia. You may be given a spirometer to practice deep breathing. A staff member will show you how to use the spirometer. - Do mild physical activity. Walking or moving your legs help your circulation and body functions return to normal. A staff member will help you when you try to walk and will provide you with simple exercises. Do not try to get up or walk alone the  first time. - Actively manage your pain. Managing your pain lets you move in comfort. We will ask you to rate your pain on a scale of zero to 10. It is your responsibility to tell your doctor or nurse where and how much you hurt so your pain can be treated.  Special Considerations -If you are diabetic, you may be placed on insulin after surgery to have closer control over your blood sugars to promote healing and recovery.  This does not mean that you will be discharged on insulin.  If applicable, your oral antidiabetics will be resumed when you are tolerating a solid diet.  -Your final pathology results from surgery should be available by the Friday after surgery and the results will be relayed to you when available.  Blood Transfusion Information WHAT IS A BLOOD TRANSFUSION? A transfusion is the replacement of blood or some of its parts. Blood is made up of multiple cells which provide different functions.  Red blood cells carry oxygen and are used for blood loss replacement.  White blood cells fight against infection.  Platelets control bleeding.  Plasma helps clot blood.  Other blood products are available for specialized needs, such as hemophilia or other clotting disorders. BEFORE THE TRANSFUSION  Who gives blood for transfusions?   You may be able to donate blood to be used at a later date on yourself (autologous donation).  Relatives can be asked to donate blood. This is generally not any safer than if you have received blood from a stranger. The same precautions are taken to ensure safety when a relative's blood is donated.  Healthy volunteers who are fully evaluated to make sure their blood is safe. This is blood bank blood. Transfusion therapy is the safest it has ever been in the practice of medicine. Before blood is taken from a donor, a complete history is taken to make sure that person has no history of diseases nor engages in risky social behavior (examples are intravenous  drug use or sexual activity with multiple partners). The donor's travel history is screened to minimize risk of transmitting infections, such as malaria. The donated blood is tested for signs of infectious diseases, such as HIV and hepatitis. The blood is then tested to be sure it is compatible with you in order to minimize the chance of a transfusion reaction. If you or a relative donates blood, this is often done in anticipation of surgery and is not appropriate for emergency situations. It takes many days to process the donated blood. RISKS AND COMPLICATIONS Although transfusion therapy is very safe and saves many lives, the main dangers of transfusion include:   Getting an infectious disease.  Developing a transfusion reaction. This is an allergic reaction to something in the blood you were given. Every precaution is taken to prevent this. The decision to have a blood transfusion has been considered carefully by your caregiver before blood is given. Blood is not given unless the benefits outweigh the risks.

## 2014-07-01 NOTE — Progress Notes (Signed)
Consult Note: Gyn-Onc  [name] 34 y.o. female  CC:  Chief Complaint  Patient presents with  . 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 WHO score was 7 at diagnosis. B HCGs were followed closely but did not normalize and she was begun on methotrexate in August 2012. She had CT head+ CAP at [facility] in August. The marker progressively dropped until mid October, when treatment was changed to bolus actinomycin D at 1.25 mg/m2, single treatment given at [facility] on 11-22-10. Repeat CT Head +CAP was done at [facility] with stable 3.2 cm simple cyst in lateral left hepatic lobe and stable small low density lesion in central liver, with enlarging hypervascular mass in endometrial canal involving right wall of uterus, no evidence of metastatic disease. 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 She was given one pre-op dose of MTX day of surgery. B HCG dropped from 5919 to 6.4 by 02-16-11, then plateau'd (serial dilutions done and confirmed HCG). She had CT CAP on 03-15-11, with chest unremarkable, stable left hepatic cyst, no pelvic adenopathy, ovaries not remarkable with corpus luteal cyst, no CT evidence of metastatic disease. She was begun on actinomycin D 12 mcg/kg/day x5 q 2 weeks on 03-13-2011, with BHCG 11.9 on 03-14-11 and 21.1 on 03-21-11, but subsequently down to normal range (2.3) by 04-11-11. Present cycle 4, which began 04-25-11, is thus the second full cycle beyond normalization of marker.  She had been having regular HCGs with the most recent one on 4/14 being <2.0.  Her mother was diagnosed ovarian carcinosarcoma was found to be BRCA positive. 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 At the time of her surgery she did  undergo bilateral salpingectomy but did not undergo oophorectomy secondary to her young age.  Her mother passed away 07-19-2012.   Interval History: She had her mammogram in March of 2015 but has not had her MRI and wants to defer until the fall secondary to her finances. She had an ultrasound July 15, 2013  that revealed: FINDINGS:  Uterus is absent.  Right ovary measures 2.9 x 1.5 x 1.5 cm in length. There are physiologic follicles in the right ovary. There is an area of decreased echogenicity in the right ovary measuring 1.2 x 0.8 x 1.2 cm. This area of decreased echogenicity was not present on the previous study. Left ovary measures 2.8 x 1.6 x 2.2 cm. There are physiologic follicles in the left ovary. Beyond physiologic follicles on the left, there is no evidence of ovarian region mass. There is no free pelvic fluid.  IMPRESSION:  Small hypoechoic mass in the right ovary measuring 1.2 x 0.8 x 1.2 cm, not present previously. This small mass may represent a hemorrhagic cyst or small endometrioma. Given the clinical history, however, this area warrants close imaging surveillance. A followup ultrasound to evaluate the right ovary in 3 months would be warranted given this circumstance. Uterus absent. Study otherwise unremarkable.  CA 125 on July 11, 2013 was 6.7.  Interval History: I last saw her in July of last year. She's been seen by Dr. Marko Plume in the interim. She had an ultrasound performed in April that revealed: IMPRESSION: 1. The right ovary is normal in appearance. 2. There is a 1.6 cm maximal  dimension hypoechoic focus in the left ovary. Previously there was a similar size cystic structure here. This may reflect interval hemorrhage into this cyst. A follow-up pelvic ultrasound examination in 6-8 weeks is recommended.  MMG 05/07/14: RECOMMENDATION: Screening mammogram at age 15. (Code:SM-B-40A)  BI-RADS CATEGORY 1: Negative.  She comes in today for follow-up. She has had a  face-to-face visit with the genetics counselors that reassured her that she most likely does have a BRCA mutation as it was a site specific test. She had a CA-125 05/12/2014 that was 13. She states that she is scheduled for the MRI of her breast at the end of June. She states that this last ultrasound little bit more painful than it was then. She has really no other symptoms. She denies any abdominal or pelvic pain, she denies early satiety or bloating. She does have occasional twinges in her pelvis but these are not new.  Review of Systems: She's overall doing well and really denies any complaints.  Her 10 point review of systems is negative  Current Meds:  Outpatient Encounter Prescriptions as of 07/01/2014  Medication Sig  . ibuprofen (ADVIL,MOTRIN) 200 MG tablet Take 400 mg by mouth daily as needed. For pain.   . Multiple Vitamins-Minerals (MULTIVITAMIN PO) Take 1 tablet by mouth daily.   No facility-administered encounter medications on file as of 07/01/2014.    Allergy: No Known Allergies  Social Hx:   History   Social History  . Marital Status: Married    Spouse Name: N/A  . Number of Children: N/A  . Years of Education: N/A   Occupational History  . Not on file.   Social History Main Topics  . Smoking status: Never Smoker   . Smokeless tobacco: Not on file  . Alcohol Use: Yes     Comment: rarely  . Drug Use: No  . Sexual Activity: Yes    Birth Control/ Protection: None   Other Topics Concern  . Not on file   Social History Narrative    Past Surgical Hx:  Past Surgical History  Procedure Laterality Date  . Cesarean section  2004, 2006  . Dilation and evacuation  08/12/2010    Procedure: DILATATION AND EVACUATION (D&E);  Surgeon: Melina Schools, MD;  Location: [facility];  Service: Gynecology;  Laterality: N/A;  . Dilation and curettage of uterus    . Abdominal hysterectomy  12/17/2010    Past Medical Hx:  Past Medical History  Diagnosis Date  . Cancer      hydatidiform mole  . 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 Family history of ovarian cancer   . BRCA1 positive     Family Hx:  Family History  Problem Relation Age of Onset  . Diabetes Other   . Diabetes Other   . Coronary artery disease Other   . Ovarian cancer Mother 9  . BRCA 1/2 Mother     BRCA1 mutation  . Ovarian cancer Maternal Aunt 47  . Breast cancer Cousin 77  . Cancer Other     "abdominal ca" in 3 sisters of mat grandfather    Vitals:  Blood pressure 118/72, pulse 66, temperature 98.5 F (36.9 C), temperature source Oral, resp. rate 16, height 5' 6"  (1.676 m), weight 179 lb 1.6 oz (81.239 kg), last menstrual period 05/31/2010.  Physical Exam: No acute distress  Neck: Supple, no lymphadenopathy, no thyromegaly.  Lungs: Clear to auscultation bilateral.  Cardiac vascular: Regular rate and rhythm  Abdomen: Soft, nontender,  nondistended. No palpable masses, no incisional hernias.  Pelvic: Normal external female genitalia. Bimanual examination reveals no masses or nodularity. I cannot appreciate her ovaries.   Assessment/Plan: 34 year old with gestational trophoblastic disease diagnosed and treated in 2012 and 2013 who has no evidence of recurrent disease from that perspective. She was found to be BRCA1 positive as her mother had an ovarian carcinosarcoma. She has a 1.2 cm lesion on her right ovary that may be consistent with a small hemorrhagic cyst or endometrioma.  She has been seen by the genetics counselors and she brings in her NCCN guidelines with her today. She at this point is decided to proceed with a bilateral oophorectomy. That is scheduled for September 21. As regards to be performing her surgery in the near future I think we can forego a repeat ultrasound as this lesion really has been quite stable over time and she has no other symptoms. She had questions regarding hormone replacement therapy. Discuss with her that in a BRCA positive  population who undergoes a bilateral salpingo-oophorectomy that there does not appear to be a significant increased risk of breast cancer short performed hormone replacement therapy. She's not sure if she would like to proceed with appendectomy she like to keep as an option. She recently had a maternal first cousin who was diagnosed with breast cancer in [place] at the age of 33. Her cousin has not had genetic testing yet. I discussed with the patient that I think she needs to consider bilateral prophylactic mastectomies by the age of 69-45 at the very latest as her pedigree does appear to have both breast and ovarian cancer. She seems to be a little less certain about proceeding with mastectomy than she does about proceeding with her oophorectomy.  She understands the surgery will require her to be out of work for 2 weeks. She was provided a note for this.  Greater than 25 minutes face-to-face time was spent with the patient today.    Brently Voorhis A., MD 07/01/2014, 11:50 AM

## 2014-07-03 ENCOUNTER — Ambulatory Visit: Payer: 59 | Admitting: Gynecologic Oncology

## 2014-07-28 ENCOUNTER — Ambulatory Visit (HOSPITAL_COMMUNITY)
Admission: RE | Admit: 2014-07-28 | Discharge: 2014-07-28 | Disposition: A | Discharge status: Home / Self Care | Payer: 59 | Source: Ambulatory Visit | Attending: Oncology | Admitting: Oncology

## 2014-07-28 ENCOUNTER — Ambulatory Visit (HOSPITAL_COMMUNITY): Admission: RE | Admit: 2014-07-28 | Payer: 59 | Source: Ambulatory Visit

## 2014-07-28 ENCOUNTER — Other Ambulatory Visit: Payer: Self-pay | Admitting: Oncology

## 2014-07-28 DIAGNOSIS — Z1501 Genetic susceptibility to malignant neoplasm of breast: Secondary | ICD-10-CM

## 2014-07-28 DIAGNOSIS — Z1509 Genetic susceptibility to other malignant neoplasm: Principal | ICD-10-CM

## 2014-07-28 DIAGNOSIS — Z1231 Encounter for screening mammogram for malignant neoplasm of breast: Secondary | ICD-10-CM | POA: Insufficient documentation

## 2014-07-29 ENCOUNTER — Telehealth: Payer: Self-pay

## 2014-07-29 NOTE — Telephone Encounter (Signed)
Told Ms. Ghan the results of the bilateral  Breast MRI as noted below by Dr. Marko Plume.

## 2014-07-29 NOTE — Telephone Encounter (Signed)
-----   Message from Gordy Levan, MD sent at 07/29/2014  8:25 AM EDT ----- Labs seen and need follow up: please let her know MRI looks fine

## 2014-07-30 ENCOUNTER — Ambulatory Visit (HOSPITAL_COMMUNITY): Payer: 59

## 2014-10-01 ENCOUNTER — Encounter: Payer: Self-pay | Admitting: Gynecologic Oncology

## 2014-10-01 ENCOUNTER — Ambulatory Visit: Payer: 59 | Attending: Gynecologic Oncology | Admitting: Gynecologic Oncology

## 2014-10-01 VITALS — BP 136/80 | HR 72 | Temp 98.5°F | Resp 16 | Ht 66.0 in | Wt 174.9 lb

## 2014-10-01 DIAGNOSIS — Z803 Family history of malignant neoplasm of breast: Secondary | ICD-10-CM | POA: Diagnosis not present

## 2014-10-01 DIAGNOSIS — Z1501 Genetic susceptibility to malignant neoplasm of breast: Secondary | ICD-10-CM | POA: Diagnosis not present

## 2014-10-01 DIAGNOSIS — Z1509 Genetic susceptibility to other malignant neoplasm: Secondary | ICD-10-CM

## 2014-10-01 DIAGNOSIS — O010 Classical hydatidiform mole: Secondary | ICD-10-CM | POA: Diagnosis not present

## 2014-10-01 DIAGNOSIS — Z8041 Family history of malignant neoplasm of ovary: Secondary | ICD-10-CM | POA: Diagnosis not present

## 2014-10-01 DIAGNOSIS — O019 Hydatidiform mole, unspecified: Secondary | ICD-10-CM

## 2014-10-01 NOTE — Patient Instructions (Signed)
Plan for surgery with Dr. Alycia Rossetti on Sept 21 for laparoscopic bilateral oophorectomy.  Pre-op testing will call you with a date and time.

## 2014-10-01 NOTE — Progress Notes (Signed)
Consult Note: Gyn-Onc  [name] 34 y.o. female  CC:  Chief Complaint  Patient presents with  . 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 WHO score was 7 at diagnosis. B HCGs were followed closely but did not normalize and she was begun on methotrexate in August 2012. She had CT head+ CAP at [place] in August. The marker progressively dropped until mid October, when treatment was changed to bolus actinomycin D at 1.25 mg/m2, single treatment given at [facility] on 11-22-10. Repeat CT Head +CAP was done at [facility] with stable 3.2 cm simple cyst in lateral left hepatic lobe and stable small low density lesion in central liver, with enlarging hypervascular mass in endometrial canal involving right wall of uterus, no evidence of metastatic disease. 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 She was given one pre-op dose of MTX day of surgery. B HCG dropped from 5919 to 6.4 by 02-16-11, then plateau'd (serial dilutions done and confirmed HCG). She had CT CAP on 03-15-11, with chest unremarkable, stable left hepatic cyst, no pelvic adenopathy, ovaries not remarkable with corpus luteal cyst, no CT evidence of metastatic disease. She was begun on actinomycin D 12 mcg/kg/day x5 q 2 weeks on 03-13-2011, with BHCG 11.9 on 03-14-11 and 21.1 on 03-21-11, but subsequently down to normal range (2.3) by 04-11-11. Present cycle 4, which began 04-25-11, is thus the second full cycle beyond normalization of marker.  She had been having regular HCGs with the most recent one on 4/14 being <2.0.  Her mother was diagnosed ovarian carcinosarcoma was found to be BRCA positive. 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 At the time of her  surgery she did undergo bilateral salpingectomy but did not undergo oophorectomy secondary to her young age.  Her mother passed away 07/05/2012.   Interval History: She had her mammogram in March of 2015 but has not had her MRI and wants to defer until the fall secondary to her finances. She had an ultrasound July 15, 2013  that revealed: FINDINGS:  Uterus is absent.  Right ovary measures 2.9 x 1.5 x 1.5 cm in length. There are physiologic follicles in the right ovary. There is an area of decreased echogenicity in the right ovary measuring 1.2 x 0.8 x 1.2 cm. This area of decreased echogenicity was not present on the previous study. Left ovary measures 2.8 x 1.6 x 2.2 cm. There are physiologic follicles in the left ovary. Beyond physiologic follicles on the left, there is no evidence of ovarian region mass. There is no free pelvic fluid.  IMPRESSION:  Small hypoechoic mass in the right ovary measuring 1.2 x 0.8 x 1.2 cm, not present previously. This small mass may represent a hemorrhagic cyst or small endometrioma. Given the clinical history, however, this area warrants close imaging surveillance. A followup ultrasound to evaluate the right ovary in 3 months would be warranted given this circumstance. Uterus absent. Study otherwise unremarkable.  CA 125 on July 11, 2013 was 6.7.  She had an ultrasound performed in April that revealed: IMPRESSION: 1. The right ovary is normal in appearance. 2. There is a 1.6 cm maximal dimension hypoechoic focus in the left ovary. Previously there was a similar size cystic structure here.   This may reflect interval hemorrhage into this cyst. A follow-up pelvic ultrasound examination in 6-8 weeks is recommended.  MMG 05/07/14: RECOMMENDATION: Screening mammogram at age 75. (Code:SM-B-40A)  BI-RADS CATEGORY 1: Negative.  She has had a face-to-face visit with the genetics counselors that reassured her that she most likely does have a BRCA mutation as it was a site  specific test. She had a CA-125 05/12/2014 that was 13.   MRI breast: IMPRESSION: Normal exam. No evidence of malignancy.  RECOMMENDATION: Screening mammogram in one year.(Code:SM-B-01Y).  Annual or biannual screening breast MRI given the patient's elevated risk of developing breast carcinoma.  BI-RADS CATEGORY 2: Benign..  Review of Systems: She's overall doing well and really denies any complaints.  Her 10 point review of systems is negative.   Current Meds:  Outpatient Encounter Prescriptions as of 10/01/2014  Medication Sig  . ibuprofen (ADVIL,MOTRIN) 200 MG tablet Take 400 mg by mouth daily as needed. For pain.   . Multiple Vitamins-Minerals (MULTIVITAMIN PO) Take 1 tablet by mouth daily.   No facility-administered encounter medications on file as of 10/01/2014.    Allergy: No Known Allergies  Social Hx:   Social History   Social History  . Marital Status: Married    Spouse Name: N/A  . Number of Children: N/A  . Years of Education: N/A   Occupational History  . Not on file.   Social History Main Topics  . Smoking status: Never Smoker   . Smokeless tobacco: Not on file  . Alcohol Use: Yes     Comment: rarely  . Drug Use: No  . Sexual Activity: Yes    Birth Control/ Protection: None   Other Topics Concern  . Not on file   Social History Narrative    Past Surgical Hx:  Past Surgical History  Procedure Laterality Date  . Cesarean section  2004, 2006  . Dilation and evacuation  08/12/2010    Procedure: DILATATION AND EVACUATION (D&E);  Surgeon: Melina Schools, MD;  Location: [facility];  Service: Gynecology;  Laterality: N/A;  . Dilation and curettage of uterus    . Abdominal hysterectomy  12/17/2010    Past Medical Hx:  Past Medical History  Diagnosis Date  . Cancer     hydatidiform mole  . 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 Family history of ovarian cancer   . BRCA1 positive     Family Hx:  Family History   Problem Relation Age of Onset  . Diabetes Other   . Diabetes Other   . Coronary artery disease Other   . Ovarian cancer Mother 61  . BRCA 1/2 Mother     BRCA1 mutation  . Ovarian cancer Maternal Aunt 69  . Breast cancer Cousin 8  . Cancer Other     "abdominal ca" in 3 sisters of mat grandfather    Vitals:  Blood pressure 136/80, pulse 72, temperature 98.5 F (36.9 C), temperature source Oral, resp. rate 16, height _0  (1.676 m), weight 174 lb 14.4 oz (79.334 kg), last menstrual period 05/31/2010.  Physical Exam: No acute distress  Neck: Supple, no lymphadenopathy, no thyromegaly.  Lungs: Clear to auscultation bilateral.  Cardiac: Regular rate and rhythm  Abdomen: Soft, nontender, nondistended. No palpable masses, no incisional hernias.  Pelvic: Normal external female genitalia. Bimanual examination reveals no masses or nodularity. I cannot appreciate her ovaries.   Assessment/Plan: 34 year old with gestational trophoblastic disease diagnosed and treated in 2012 and 2013 who has no evidence of  recurrent disease from that perspective. She was found to be BRCA1 positive as her mother had an ovarian carcinosarcoma. She has a 1.2 cm lesion on her right ovary that may be consistent with a small hemorrhagic cyst or endometrioma.  She has been seen by the genetics counselors and she brings in her NCCN guidelines with her today. She at this point is decided to proceed with a bilateral oophorectomy. She has previously had questions regarding hormone replacement therapy. I discussed with her that in a BRCA positive population who undergoes a bilateral salpingo-oophorectomy that there does not appear to be a significant increased risk of breast cancer short performed hormone replacement therapy.  We again reviewed the recommendations for removal of the ovaries in answer to questions regarding this. She is unsure when she was to have her breast removed. Her cousin was the next was diagnosed  with breast cancer 74 subtotally unfortunately died at the age of 33 from her breast cancer. Mrs. Jakel is uncertain about how she would like to proceed and had several questions regarding this. I discussed with her that this would be something definitely that would be discussed with her surgical oncologist as well as a Psychiatric nurse.  Her questions regarding removal of her ovaries were discussed with her. This is tentatively planned for September 21.     Verity Gilcrest A., MD 10/01/2014, 10:45 AM

## 2014-10-11 ENCOUNTER — Encounter: Payer: Self-pay | Admitting: Internal Medicine

## 2014-10-11 ENCOUNTER — Ambulatory Visit (INDEPENDENT_AMBULATORY_CARE_PROVIDER_SITE_OTHER): Payer: 59 | Admitting: Internal Medicine

## 2014-10-11 VITALS — BP 112/70 | HR 82 | Temp 97.9°F | Ht 66.0 in | Wt 179.2 lb

## 2014-10-11 DIAGNOSIS — H1032 Unspecified acute conjunctivitis, left eye: Secondary | ICD-10-CM | POA: Diagnosis not present

## 2014-10-11 MED ORDER — SULFACETAMIDE SODIUM 10 % OP SOLN: 2.0000 [drp] | Freq: Four times a day (QID) | OPHTHALMIC | Status: DC

## 2014-10-11 NOTE — Progress Notes (Signed)
   Subjective:    Patient ID: [name], female    DOB: Jul 02, 1980, 34 y.o.   MRN: [id number]  HPI Here due to eye drainage  Started yesterday with drainage Eye matted shut this AM Still some swelling Left eye only  Sore throat started 5 days ago Felt like a "regular cold" Slight pain/scratchy feeling in eye  No fever No vision changes No eye trauma  Using dayquil, mucinex and cough stuff for the cold  Current Outpatient Prescriptions on File Prior to Visit  Medication Sig Dispense Refill  . ibuprofen (ADVIL,MOTRIN) 200 MG tablet Take 400 mg by mouth daily as needed. For pain.     . Multiple Vitamins-Minerals (MULTIVITAMIN PO) Take 1 tablet by mouth daily.     No current facility-administered medications on file prior to visit.    No Known Allergies  Past Medical History  Diagnosis Date  . Cancer     hydatidiform mole  . 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 Family history of ovarian cancer   . BRCA1 positive     Past Surgical History  Procedure Laterality Date  . Cesarean section  2004, 2006  . Dilation and evacuation  08/12/2010    Procedure: DILATATION AND EVACUATION (D&E);  Surgeon: Melina Schools, MD;  Location: [facility];  Service: Gynecology;  Laterality: N/A;  . Dilation and curettage of uterus    . Abdominal hysterectomy  12/17/2010    Family History  Problem Relation Age of Onset  . Diabetes Other   . Diabetes Other   . Coronary artery disease Other   . Ovarian cancer Mother 52  . BRCA 1/2 Mother     BRCA1 mutation  . Ovarian cancer Maternal Aunt 81  . Breast cancer Cousin 25  . Cancer Other     "abdominal ca" in 3 sisters of mat grandfather    Social History   Social History  . Marital Status: Married    Spouse Name: N/A  . Number of Children: N/A  . Years of Education: N/A   Occupational History  . Not on file.   Social History Main Topics  . Smoking status: Never Smoker   . Smokeless tobacco: Never Used   . Alcohol Use: 0.0 oz/week    0 Standard drinks or equivalent per week     Comment: rarely  . Drug Use: No  . Sexual Activity: Yes    Birth Control/ Protection: None   Other Topics Concern  . Not on file   Social History Narrative   Review of Systems No vomiting or diarrhea No rash Appetite is okay    Objective:   Physical Exam  Constitutional: She appears well-developed and well-nourished. No distress.  HENT:  Mouth/Throat: Oropharynx is clear and moist. No oropharyngeal exudate.  TMs normal Mild nasal congestion  Eyes:  Left eye with injected conjunctiva No lesions otherwise  Neck: Normal range of motion. Neck supple.  Lymphadenopathy:    She has no cervical adenopathy.          Assessment & Plan:

## 2014-10-11 NOTE — Assessment & Plan Note (Signed)
Likely viral --given her concomitant cold symptoms If worsens, will start sulfacetamide drops

## 2014-10-11 NOTE — Progress Notes (Signed)
Pre visit review using our clinic review tool, if applicable. No additional management support is needed unless otherwise documented below in the visit note. 

## 2014-10-15 NOTE — Patient Instructions (Addendum)
[name]  10/15/2014   Your procedure is scheduled on:   10/22/2014    Report to [facility]  Entrance take [place]  elevators to 3rd floor to  [place] at     [place] AM.  Call this number if you have problems the morning of surgery [phone]   Remember: ONLY 1 PERSON MAY GO WITH YOU TO SHORT STAY TO GET  READY MORNING OF [place].  Do not eat food or drink liquids :After Midnight.             Clear liquid diet beginning on Tuesday 10/21/2014       Take these medicines the morning of surgery with A SIP OF WATER: none                                You may not have any metal on your body including hair pins and              piercings  Do not wear jewelry, make-up, lotions, powders or perfumes, deodorant             Do not wear nail polish.  Do not shave  48 hours prior to surgery.           Do not bring valuables to the hospital. [place].  Contacts, dentures or bridgework may not be worn into surgery.      Patients discharged the day of surgery will not be allowed to drive home.  Name and phone number of your driver:  Special Instructions: coughing and deep breathing exercises, leg exercises               Please read over the following fact sheets you were given: _____________________________________________________________________             [facility] - Preparing for Surgery Before surgery, you can play an important role.  Because skin is not sterile, your skin needs to be as free of germs as possible.  You can reduce the number of germs on your skin by washing with CHG (chlorahexidine gluconate) soap before surgery.  CHG is an antiseptic cleaner which kills germs and bonds with the skin to continue killing germs even after washing. Please DO NOT use if you have an allergy to CHG or antibacterial soaps.  If your skin becomes reddened/irritated stop using the CHG and inform your  nurse when you arrive at Short Stay. Do not shave (including legs and underarms) for at least 48 hours prior to the first CHG shower.  You may shave your face/neck. Please follow these instructions carefully:  1.  Shower with CHG Soap the night before surgery and the  morning of Surgery.  2.  If you choose to wash your hair, wash your hair first as usual with your  normal  shampoo.  3.  After you shampoo, rinse your hair and body thoroughly to remove the  shampoo.                           4.  Use CHG as you would any other liquid soap.  You can apply chg directly  to the skin and wash  Gently with a scrungie or clean washcloth.  5.  Apply the CHG Soap to your body ONLY FROM THE NECK DOWN.   Do not use on face/ open                           Wound or open sores. Avoid contact with eyes, ears mouth and genitals (private parts).                       Wash face,  Genitals (private parts) with your normal soap.             6.  Wash thoroughly, paying special attention to the area where your surgery  will be performed.  7.  Thoroughly rinse your body with warm water from the neck down.  8.  DO NOT shower/wash with your normal soap after using and rinsing off  the CHG Soap.                9.  Pat yourself dry with a clean towel.            10.  Wear clean pajamas.            11.  Place clean sheets on your bed the night of your first shower and do not  sleep with pets. Day of Surgery : Do not apply any lotions/deodorants the morning of surgery.  Please wear clean clothes to the hospital/surgery center.  FAILURE TO FOLLOW THESE INSTRUCTIONS MAY RESULT IN THE CANCELLATION OF YOUR SURGERY PATIENT SIGNATURE_________________________________  NURSE SIGNATURE__________________________________  ________________________________________________________________________    CLEAR LIQUID DIET   Foods Allowed                                                                     Foods  Excluded  Coffee and tea, regular and decaf                             liquids that you cannot  Plain Jell-O in any flavor                                             see through such as: Fruit ices (not with fruit pulp)                                     milk, soups, orange juice  Iced Popsicles                                    All solid food Carbonated beverages, regular and diet                                    Cranberry, grape and apple juices Sports drinks like Gatorade Lightly seasoned clear broth or consume(fat free) Sugar, honey syrup  Sample Menu Breakfast                                Lunch                                     Supper Cranberry juice                    Beef broth                            Chicken broth Jell-O                                     Grape juice                           Apple juice Coffee or tea                        Jell-O                                      Popsicle                                                Coffee or tea                        Coffee or tea  _____________________________________________________________________   WHAT IS A BLOOD TRANSFUSION? Blood Transfusion Information  A transfusion is the replacement of blood or some of its parts. Blood is made up of multiple cells which provide different functions.  Red blood cells carry oxygen and are used for blood loss replacement.  White blood cells fight against infection.  Platelets control bleeding.  Plasma helps clot blood.  Other blood products are available for specialized needs, such as hemophilia or other clotting disorders. BEFORE THE TRANSFUSION  Who gives blood for transfusions?   Healthy volunteers who are fully evaluated to make sure their blood is safe. This is blood bank blood. Transfusion therapy is the safest it has ever been in the practice of medicine. Before blood is taken from a donor, a complete history is taken to make sure that person has no  history of diseases nor engages in risky social behavior (examples are intravenous drug use or sexual activity with multiple partners). The donor's travel history is screened to minimize risk of transmitting infections, such as malaria. The donated blood is tested for signs of infectious diseases, such as HIV and hepatitis. The blood is then tested to be sure it is compatible with you in order to minimize the chance of a transfusion reaction. If you or a relative donates blood, this is often done in anticipation of surgery and is not appropriate for emergency situations. It takes many days to process the donated blood. RISKS AND COMPLICATIONS Although transfusion therapy is very safe and saves many lives, the main dangers of transfusion include:  1. Getting an infectious disease. 2. Developing a transfusion reaction. This is an allergic reaction to something in the blood you were given. Every precaution is taken to prevent this. The decision to have a blood transfusion has been considered carefully by your caregiver before blood is given. Blood is not given unless the benefits outweigh the risks. AFTER THE TRANSFUSION  Right after receiving a blood transfusion, you will usually feel much better and more energetic. This is especially true if your red blood cells have gotten low (anemic). The transfusion raises the level of the red blood cells which carry oxygen, and this usually causes an energy increase.  The nurse administering the transfusion will monitor you carefully for complications. HOME CARE INSTRUCTIONS  No special instructions are needed after a transfusion. You may find your energy is better. Speak with your caregiver about any limitations on activity for underlying diseases you may have. SEEK MEDICAL CARE IF:   Your condition is not improving after your transfusion.  You develop redness or irritation at the intravenous (IV) site. SEEK IMMEDIATE MEDICAL CARE IF:  Any of the following  symptoms occur over the next 12 hours:  Shaking chills.  You have a temperature by mouth above 102 F (38.9 C), not controlled by medicine.  Chest, back, or muscle pain.  People around you feel you are not acting correctly or are confused.  Shortness of breath or difficulty breathing.  Dizziness and fainting.  You get a rash or develop hives.  You have a decrease in urine output.  Your urine turns a dark color or changes to pink, red, or brown. Any of the following symptoms occur over the next 10 days:  You have a temperature by mouth above 102 F (38.9 C), not controlled by medicine.  Shortness of breath.  Weakness after normal activity.  The white part of the eye turns yellow (jaundice).  You have a decrease in the amount of urine or are urinating less often.  Your urine turns a dark color or changes to pink, red, or brown. Document Released: 01/15/2000 Document Revised: 04/11/2011 Document Reviewed: 09/03/2007 ExitCare Patient Information 2014 [place].  _______________________________________________________________________  Incentive Spirometer  An incentive spirometer is a tool that can help keep your lungs clear and active. This tool measures how well you are filling your lungs with each breath. Taking long deep breaths may help reverse or decrease the chance of developing breathing (pulmonary) problems (especially infection) following:  A long period of time when you are unable to move or be active. BEFORE THE PROCEDURE   If the spirometer includes an indicator to show your best effort, your nurse or respiratory therapist will set it to a desired goal.  If possible, sit up straight or lean slightly forward. Try not to slouch.  Hold the incentive spirometer in an upright position. INSTRUCTIONS FOR USE  3. Sit on the edge of your bed if possible, or sit up as far as you can in bed or on a chair. 4. Hold the incentive spirometer in an upright  position. 5. Breathe out normally. 6. Place the mouthpiece in your mouth and seal your lips tightly around it. 7. Breathe in slowly and as deeply as possible, raising the piston or the ball toward the top of the column. 8. Hold your breath for 3-5 seconds or for as long as possible. Allow the piston or ball to fall to the bottom of the column. 9. Remove the mouthpiece from your mouth and breathe out  normally. 10. Rest for a few seconds and repeat Steps 1 through 7 at least 10 times every 1-2 hours when you are awake. Take your time and take a few normal breaths between deep breaths. 11. The spirometer may include an indicator to show your best effort. Use the indicator as a goal to work toward during each repetition. 12. After each set of 10 deep breaths, practice coughing to be sure your lungs are clear. If you have an incision (the cut made at the time of surgery), support your incision when coughing by placing a pillow or rolled up towels firmly against it. Once you are able to get out of bed, walk around indoors and cough well. You may stop using the incentive spirometer when instructed by your caregiver.  RISKS AND COMPLICATIONS  Take your time so you do not get dizzy or light-headed.  If you are in pain, you may need to take or ask for pain medication before doing incentive spirometry. It is harder to take a deep breath if you are having pain. AFTER USE  Rest and breathe slowly and easily.  It can be helpful to keep track of a log of your progress. Your caregiver can provide you with a simple table to help with this. If you are using the spirometer at home, follow these instructions: [place] IF:   You are having difficultly using the spirometer.  You have trouble using the spirometer as often as instructed.  Your pain medication is not giving enough relief while using the spirometer.  You develop fever of 100.5 F (38.1 C) or higher. SEEK IMMEDIATE MEDICAL CARE IF:    You cough up bloody sputum that had not been present before.  You develop fever of 102 F (38.9 C) or greater.  You develop worsening pain at or near the incision site. MAKE SURE YOU:   Understand these instructions.  Will watch your condition.  Will get help right away if you are not doing well or get worse. Document Released: 05/30/2006 Document Revised: 04/11/2011 Document Reviewed: 07/31/2006 [facility] Patient Information 2014 [place], [place].   ________________________________________________________________________

## 2014-10-17 ENCOUNTER — Encounter: Payer: Self-pay | Admitting: Internal Medicine

## 2014-10-17 ENCOUNTER — Ambulatory Visit (INDEPENDENT_AMBULATORY_CARE_PROVIDER_SITE_OTHER): Payer: 59 | Admitting: Internal Medicine

## 2014-10-17 ENCOUNTER — Encounter (HOSPITAL_COMMUNITY): Payer: Self-pay

## 2014-10-17 ENCOUNTER — Ambulatory Visit (HOSPITAL_COMMUNITY)
Admission: RE | Admit: 2014-10-17 | Discharge: 2014-10-17 | Disposition: A | Discharge status: Home / Self Care | Payer: 59 | Source: Ambulatory Visit | Attending: Anesthesiology | Admitting: Anesthesiology

## 2014-10-17 ENCOUNTER — Encounter (HOSPITAL_COMMUNITY)
Admission: RE | Admit: 2014-10-17 | Discharge: 2014-10-17 | Disposition: A | Discharge status: Home / Self Care | Payer: 59 | Source: Ambulatory Visit | Attending: Gynecologic Oncology | Admitting: Gynecologic Oncology

## 2014-10-17 VITALS — BP 106/72 | HR 66 | Temp 99.1°F | Wt 177.0 lb

## 2014-10-17 DIAGNOSIS — J029 Acute pharyngitis, unspecified: Secondary | ICD-10-CM | POA: Diagnosis not present

## 2014-10-17 DIAGNOSIS — Z1501 Genetic susceptibility to malignant neoplasm of breast: Secondary | ICD-10-CM | POA: Diagnosis not present

## 2014-10-17 DIAGNOSIS — Z01818 Encounter for other preprocedural examination: Secondary | ICD-10-CM | POA: Insufficient documentation

## 2014-10-17 DIAGNOSIS — R0982 Postnasal drip: Secondary | ICD-10-CM | POA: Diagnosis not present

## 2014-10-17 DIAGNOSIS — Z01812 Encounter for preprocedural laboratory examination: Secondary | ICD-10-CM | POA: Diagnosis not present

## 2014-10-17 DIAGNOSIS — Z0183 Encounter for blood typing: Secondary | ICD-10-CM | POA: Diagnosis not present

## 2014-10-17 HISTORY — DX: Acute pharyngitis, unspecified: J02.9

## 2014-10-17 LAB — CBC
HCT: 37.7 % (ref 36.0–46.0)
HEMOGLOBIN: 12.9 g/dL (ref 12.0–15.0)
MCH: 30.3 pg (ref 26.0–34.0)
MCHC: 34.2 g/dL (ref 30.0–36.0)
MCV: 88.5 fL (ref 78.0–100.0)
Platelets: 224 10*3/uL (ref 150–400)
RBC: 4.26 MIL/uL (ref 3.87–5.11)
RDW: 12.4 % (ref 11.5–15.5)
WBC: 6.9 10*3/uL (ref 4.0–10.5)

## 2014-10-17 LAB — BASIC METABOLIC PANEL
ANION GAP: 8 (ref 5–15)
BUN: 9 mg/dL (ref 6–20)
CALCIUM: 9.6 mg/dL (ref 8.9–10.3)
CO2: 26 mmol/L (ref 22–32)
Chloride: 105 mmol/L (ref 101–111)
Creatinine, Ser: 0.6 mg/dL (ref 0.44–1.00)
GFR calc non Af Amer: >60 mL/min (ref >60)
Glucose, Bld: 107 mg/dL — ABNORMAL HIGH (ref 65–99)
Potassium: 4.2 mmol/L (ref 3.5–5.1)
Sodium: 139 mmol/L (ref 135–145)

## 2014-10-17 LAB — POCT RAPID STREP A (OFFICE): RAPID STREP A SCREEN: NEGATIVE

## 2014-10-17 LAB — ABO/RH: ABO/RH(D): O POS

## 2014-10-17 NOTE — Addendum Note (Signed)
Addended by: Lurlean Nanny on: 10/17/2014 01:46 PM   Modules accepted: Orders

## 2014-10-17 NOTE — Progress Notes (Signed)
Rerequested LOV note, ekg, labs and CXR if available done 10/09/2014.

## 2014-10-17 NOTE — Progress Notes (Signed)
Pre visit review using our clinic review tool, if applicable. No additional management support is needed unless otherwise documented below in the visit note. 

## 2014-10-17 NOTE — Patient Instructions (Signed)

## 2014-10-17 NOTE — Progress Notes (Signed)
Subjective:    Patient ID: [name], female    DOB: 20-May-1980, 34 y.o.   MRN: [id number]  HPI  Pt presents to the clinic today with c/o sore throat. This started 2 weeks ago. She has had some difficulty swallowing. Her pain seems worse in the morning. She denies runny nose, ear pain or cough. She has tried Ibuprofen OTC with some relief. She does not have a history of seasonal allergies. She has not had sick contacts.   Review of Systems      Past Medical History  Diagnosis Date  . Cancer     hydatidiform mole  . 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 Family history of ovarian cancer   . BRCA1 positive   . Sore throat     recent 10/17/2014 to see PCP on 10/17/14     Current Outpatient Prescriptions  Medication Sig Dispense Refill  . ibuprofen (ADVIL,MOTRIN) 200 MG tablet Take 400 mg by mouth daily as needed. For pain.     . Multiple Vitamins-Minerals (MULTIVITAMIN PO) Take 1 tablet by mouth daily.     No current facility-administered medications for this visit.    No Known Allergies  Family History  Problem Relation Age of Onset  . Diabetes Other   . Diabetes Other   . Coronary artery disease Other   . Ovarian cancer Mother 78  . BRCA 1/2 Mother     BRCA1 mutation  . Ovarian cancer Maternal Aunt 66  . Breast cancer Cousin 55  . Cancer Other     "abdominal ca" in 3 sisters of mat grandfather    Social History   Social History  . Marital Status: Married    Spouse Name: N/A  . Number of Children: N/A  . Years of Education: N/A   Occupational History  . Not on file.   Social History Main Topics  . Smoking status: Never Smoker   . Smokeless tobacco: Never Used  . Alcohol Use: 0.0 oz/week    0 Standard drinks or equivalent per week     Comment: rarely  . Drug Use: No  . Sexual Activity: Yes    Birth Control/ Protection: None   Other Topics Concern  . Not on file   Social History Narrative     Constitutional: Denies  fever, malaise, fatigue, headache or abrupt weight changes.  HEENT: Pt reports sore throat. Denies eye pain, eye redness, ear pain, ringing in the ears, wax buildup, runny nose, nasal congestion, bloody nose. Respiratory: Denies difficulty breathing, shortness of breath, cough or sputum production.   Cardiovascular: Denies chest pain, chest tightness, palpitations or swelling in the hands or feet.    No other specific complaints in a complete review of systems (except as listed in HPI above).  Objective:   Physical Exam   BP 106/72 mmHg  Pulse 66  Temp(Src) 99.1 F (37.3 C) (Oral)  Wt 177 lb (80.287 kg)  SpO2 98%  LMP 05/31/2010 Wt Readings from Last 3 Encounters:  10/17/14 177 lb (80.287 kg)  10/17/14 178 lb (80.74 kg)  10/11/14 179 lb 4 oz (81.307 kg)    General: Appears her  stated age, in NAD. Skin: Warm, dry and intact. No rashes, lesions or ulcerations noted. HEENT: Head: normal shape and size, no sinus tenderness  noted; Eyes: sclera white, no icterus, conjunctiva pink; Ears: Tm's gray and intact, normal light reflex; Nose: mucosa boggy and moist, septum midline; Throat/Mouth: Teeth present, mucosa erythematous and moist, no  exudate, lesions or ulcerations noted.  Neck:  No adenopathy noted. Cardiovascular: Normal rate and rhythm. S1,S2 noted.  No murmur, rubs or gallops noted.  Pulmonary/Chest: Normal effort and positive vesicular breath sounds. No respiratory distress. No wheezes, rales or ronchi noted.   BMET    Component Value Date/Time   NA 139 10/17/2014 1130   NA 143 04/29/2013 0958   K 4.2 10/17/2014 1130   K 4.0 04/29/2013 0958   CL 105 10/17/2014 1130   CL 108* 03/05/2012 0918   CO2 26 10/17/2014 1130   CO2 25 04/29/2013 0958   GLUCOSE 107* 10/17/2014 1130   GLUCOSE 99 04/29/2013 0958   GLUCOSE 75 03/05/2012 0918   BUN 9 10/17/2014 1130   BUN 10.6 04/29/2013 0958   CREATININE 0.60 10/17/2014 1130   CREATININE 0.7 04/29/2013 0958   CALCIUM 9.6  10/17/2014 1130   CALCIUM 9.5 04/29/2013 0958   GFRNONAA >60 10/17/2014 1130   GFRAA >60 10/17/2014 1130    Lipid Panel     Component Value Date/Time   CHOL 149 02/26/2014 1007   TRIG 84.0 02/26/2014 1007   HDL 48.30 02/26/2014 1007   CHOLHDL 3 02/26/2014 1007   VLDL 16.8 02/26/2014 1007   LDLCALC 84 02/26/2014 1007    CBC    Component Value Date/Time   WBC 6.9 10/17/2014 1130   WBC 5.4 04/29/2013 0958   RBC 4.26 10/17/2014 1130   RBC 4.43 04/29/2013 0958   HGB 12.9 10/17/2014 1130   HGB 13.3 04/29/2013 0958   HCT 37.7 10/17/2014 1130   HCT 39.0 04/29/2013 0958   PLT 224 10/17/2014 1130   PLT 240 04/29/2013 0958   MCV 88.5 10/17/2014 1130   MCV 88.0 04/29/2013 0958   MCH 30.3 10/17/2014 1130   MCH 30.0 04/29/2013 0958   MCHC 34.2 10/17/2014 1130   MCHC 34.1 04/29/2013 0958   RDW 12.4 10/17/2014 1130   RDW 12.4 04/29/2013 0958   LYMPHSABS 1.6 02/26/2014 1007   LYMPHSABS 1.6 04/29/2013 0958   MONOABS 0.4 02/26/2014 1007   MONOABS 0.4 04/29/2013 0958   EOSABS 0.1 02/26/2014 1007   EOSABS 0.1 04/29/2013 0958   BASOSABS 0.0 02/26/2014 1007   BASOSABS 0.1 04/29/2013 0958    Hgb A1C No results found for: HGBA1C      Assessment & Plan:   Sore throat:  I think this is just allergies RST negative Continue Ibuprofen Start Claritin once daily Return precautions given  RTC as needed or if symptoms persist or worse

## 2014-10-17 NOTE — Progress Notes (Signed)
Called and spoke with Joylene John, NP at [facility].  Informed her of patient's complaint of sore throat today at preop.  Patient seen by NP at [facility] Note. In epic.  Strep negative per lab report. CXR- negative.  White count normal.  Joylene John, NP informed of above.  No further orders given.

## 2014-10-17 NOTE — Progress Notes (Signed)
Patient has sore throat. Ongoing x 2 weeks. Patient has notice minor cough with slight green productive cough.  To see PCP today at 200pm.

## 2014-10-21 NOTE — Anesthesia Preprocedure Evaluation (Signed)
Anesthesia Evaluation  Patient identified by MRN, date of birth, ID band Patient awake    Reviewed: Allergy & Precautions, H&P , NPO status , Patient's Chart, lab work & pertinent test results  Airway Mallampati: II  TM Distance: >3 FB Neck ROM: full    Dental no notable dental hx. (+) Dental Advisory Given   Pulmonary neg pulmonary ROS,    Pulmonary exam normal breath sounds clear to auscultation       Cardiovascular Exercise Tolerance: Good negative cardio ROS Normal cardiovascular exam Rhythm:regular Rate:Normal     Neuro/Psych negative neurological ROS  negative psych ROS   GI/Hepatic negative GI ROS, Neg liver ROS,   Endo/Other  negative endocrine ROS  Renal/GU negative Renal ROS  negative genitourinary   Musculoskeletal   Abdominal   Peds  Hematology negative hematology ROS (+)   Anesthesia Other Findings   Reproductive/Obstetrics negative OB ROS                             Anesthesia Physical Anesthesia Plan  ASA: I  Anesthesia Plan: General   Post-op Pain Management:    Induction: Intravenous  Airway Management Planned: Oral ETT  Additional Equipment:   Intra-op Plan:   Post-operative Plan: Extubation in OR  Informed Consent: I have reviewed the patients History and Physical, chart, labs and discussed the procedure including the risks, benefits and alternatives for the proposed anesthesia with the patient or authorized representative who has indicated his/her understanding and acceptance.   Dental Advisory Given  Plan Discussed with: CRNA and Surgeon  Anesthesia Plan Comments:         Anesthesia Quick Evaluation

## 2014-10-22 ENCOUNTER — Encounter (HOSPITAL_COMMUNITY)
Admission: RE | Disposition: A | Discharge status: Home / Self Care | Payer: Self-pay | Source: Ambulatory Visit | Attending: Gynecologic Oncology

## 2014-10-22 ENCOUNTER — Ambulatory Visit (HOSPITAL_COMMUNITY): Payer: 59 | Admitting: Anesthesiology

## 2014-10-22 ENCOUNTER — Ambulatory Visit (HOSPITAL_COMMUNITY)
Admission: RE | Admit: 2014-10-22 | Discharge: 2014-10-22 | Disposition: A | Discharge status: Home / Self Care | Payer: 59 | Source: Ambulatory Visit | Attending: Gynecologic Oncology | Admitting: Gynecologic Oncology

## 2014-10-22 ENCOUNTER — Encounter (HOSPITAL_COMMUNITY): Payer: Self-pay | Admitting: Registered Nurse

## 2014-10-22 DIAGNOSIS — Z1509 Genetic susceptibility to other malignant neoplasm: Secondary | ICD-10-CM

## 2014-10-22 DIAGNOSIS — Z8759 Personal history of other complications of pregnancy, childbirth and the puerperium: Secondary | ICD-10-CM | POA: Diagnosis not present

## 2014-10-22 DIAGNOSIS — Z1501 Genetic susceptibility to malignant neoplasm of breast: Secondary | ICD-10-CM | POA: Insufficient documentation

## 2014-10-22 DIAGNOSIS — Z791 Long term (current) use of non-steroidal anti-inflammatories (NSAID): Secondary | ICD-10-CM | POA: Diagnosis not present

## 2014-10-22 DIAGNOSIS — Z803 Family history of malignant neoplasm of breast: Secondary | ICD-10-CM | POA: Diagnosis not present

## 2014-10-22 DIAGNOSIS — Z8041 Family history of malignant neoplasm of ovary: Secondary | ICD-10-CM | POA: Insufficient documentation

## 2014-10-22 DIAGNOSIS — Z79899 Other long term (current) drug therapy: Secondary | ICD-10-CM | POA: Insufficient documentation

## 2014-10-22 DIAGNOSIS — Z1502 Genetic susceptibility to malignant neoplasm of ovary: Secondary | ICD-10-CM

## 2014-10-22 DIAGNOSIS — O010 Classical hydatidiform mole: Secondary | ICD-10-CM

## 2014-10-22 DIAGNOSIS — Z4002 Encounter for prophylactic removal of ovary: Secondary | ICD-10-CM | POA: Insufficient documentation

## 2014-10-22 HISTORY — PX: LAPAROSCOPIC OOPHERECTOMY: SHX6507

## 2014-10-22 LAB — TYPE AND SCREEN
ABO/RH(D): O POS
ANTIBODY SCREEN: NEGATIVE

## 2014-10-22 SURGERY — LAPAROSCOPIC OOPHERECTOMY
Anesthesia: General | Laterality: Bilateral

## 2014-10-22 MED ORDER — PROPOFOL 10 MG/ML IV BOLUS
INTRAVENOUS | Status: DC | PRN
  Administered 2014-10-22 (×2): 50 mg via INTRAVENOUS
  Administered 2014-10-22: 200 mg via INTRAVENOUS

## 2014-10-22 MED ORDER — FENTANYL CITRATE (PF) 100 MCG/2ML IJ SOLN
INTRAMUSCULAR | Status: DC | PRN
  Administered 2014-10-22: 100 ug via INTRAVENOUS
  Administered 2014-10-22 (×3): 50 ug via INTRAVENOUS
  Administered 2014-10-22: 100 ug via INTRAVENOUS
  Administered 2014-10-22 (×3): 50 ug via INTRAVENOUS

## 2014-10-22 MED ORDER — PROMETHAZINE HCL 25 MG/ML IJ SOLN
6.2500 mg | INTRAMUSCULAR | Status: DC | PRN
  Administered 2014-10-22: 6.25 mg via INTRAVENOUS
  Filled 2014-10-22: qty 1

## 2014-10-22 MED ORDER — MORPHINE SULFATE (PF) 10 MG/ML IV SOLN: 2.0000 mg | INTRAVENOUS | Status: DC | PRN

## 2014-10-22 MED ORDER — DEXAMETHASONE SODIUM PHOSPHATE 10 MG/ML IJ SOLN
INTRAMUSCULAR | Status: AC
  Filled 2014-10-22: qty 1

## 2014-10-22 MED ORDER — NEOSTIGMINE METHYLSULFATE 10 MG/10ML IV SOLN
INTRAVENOUS | Status: DC | PRN
  Administered 2014-10-22: 3 mg via INTRAVENOUS

## 2014-10-22 MED ORDER — OXYCODONE-ACETAMINOPHEN 5-325 MG PO TABS: 2.0000 | ORAL_TABLET | Freq: Four times a day (QID) | ORAL | Status: DC | PRN

## 2014-10-22 MED ORDER — SODIUM CHLORIDE 0.9 % IJ SOLN: 3.0000 mL | INTRAMUSCULAR | Status: DC | PRN

## 2014-10-22 MED ORDER — LACTATED RINGERS IV SOLN
INTRAVENOUS | Status: DC | PRN
  Administered 2014-10-22 (×2): via INTRAVENOUS

## 2014-10-22 MED ORDER — ROCURONIUM BROMIDE 100 MG/10ML IV SOLN
INTRAVENOUS | Status: DC | PRN
  Administered 2014-10-22: 50 mg via INTRAVENOUS

## 2014-10-22 MED ORDER — DEXAMETHASONE SODIUM PHOSPHATE 10 MG/ML IJ SOLN
INTRAMUSCULAR | Status: DC | PRN
  Administered 2014-10-22: 10 mg via INTRAVENOUS

## 2014-10-22 MED ORDER — BUPIVACAINE HCL (PF) 0.25 % IJ SOLN
INTRAMUSCULAR | Status: AC
  Filled 2014-10-22: qty 30

## 2014-10-22 MED ORDER — PROPOFOL 10 MG/ML IV BOLUS
INTRAVENOUS | Status: AC
  Filled 2014-10-22: qty 20

## 2014-10-22 MED ORDER — LACTATED RINGERS IR SOLN
Status: DC | PRN
  Administered 2014-10-22: 1000 mL

## 2014-10-22 MED ORDER — ONDANSETRON HCL 4 MG/2ML IJ SOLN
INTRAMUSCULAR | Status: AC
  Filled 2014-10-22: qty 2

## 2014-10-22 MED ORDER — FENTANYL CITRATE (PF) 250 MCG/5ML IJ SOLN
INTRAMUSCULAR | Status: AC
  Filled 2014-10-22: qty 25

## 2014-10-22 MED ORDER — GLYCOPYRROLATE 0.2 MG/ML IJ SOLN
INTRAMUSCULAR | Status: DC | PRN
  Administered 2014-10-22: 0.4 mg via INTRAVENOUS

## 2014-10-22 MED ORDER — ACETAMINOPHEN 650 MG RE SUPP
650.0000 mg | RECTAL | Status: DC | PRN
  Filled 2014-10-22: qty 1

## 2014-10-22 MED ORDER — SODIUM CHLORIDE 0.9 % IJ SOLN: 3.0000 mL | Freq: Two times a day (BID) | INTRAMUSCULAR | Status: DC

## 2014-10-22 MED ORDER — LIDOCAINE HCL (CARDIAC) 20 MG/ML IV SOLN
INTRAVENOUS | Status: AC
  Filled 2014-10-22: qty 5

## 2014-10-22 MED ORDER — MIDAZOLAM HCL 2 MG/2ML IJ SOLN
INTRAMUSCULAR | Status: AC
  Filled 2014-10-22: qty 4

## 2014-10-22 MED ORDER — ONDANSETRON HCL 4 MG/2ML IJ SOLN
INTRAMUSCULAR | Status: DC | PRN
  Administered 2014-10-22: 4 mg via INTRAVENOUS

## 2014-10-22 MED ORDER — LIDOCAINE HCL (CARDIAC) 20 MG/ML IV SOLN
INTRAVENOUS | Status: DC | PRN
  Administered 2014-10-22: 25 mg via INTRATRACHEAL
  Administered 2014-10-22: 75 mg via INTRAVENOUS

## 2014-10-22 MED ORDER — KETOROLAC TROMETHAMINE 30 MG/ML IJ SOLN
INTRAMUSCULAR | Status: DC | PRN
  Administered 2014-10-22: 30 mg via INTRAVENOUS

## 2014-10-22 MED ORDER — MIDAZOLAM HCL 5 MG/5ML IJ SOLN
INTRAMUSCULAR | Status: DC | PRN
  Administered 2014-10-22: 2 mg via INTRAVENOUS

## 2014-10-22 MED ORDER — LACTATED RINGERS IV SOLN: INTRAVENOUS | Status: DC

## 2014-10-22 MED ORDER — CEFAZOLIN SODIUM-DEXTROSE 2-3 GM-% IV SOLR
INTRAVENOUS | Status: AC
  Filled 2014-10-22: qty 50

## 2014-10-22 MED ORDER — ACETAMINOPHEN 500 MG PO TABS
1000.0000 mg | ORAL_TABLET | Freq: Four times a day (QID) | ORAL | Status: DC
Start: 2014-10-22 — End: 2014-10-22
  Filled 2014-10-22 (×4): qty 2

## 2014-10-22 MED ORDER — ACETAMINOPHEN 325 MG PO TABS: 650.0000 mg | ORAL_TABLET | ORAL | Status: DC | PRN

## 2014-10-22 MED ORDER — HYDROMORPHONE HCL 1 MG/ML IJ SOLN: 0.2500 mg | INTRAMUSCULAR | Status: DC | PRN

## 2014-10-22 MED ORDER — KETOROLAC TROMETHAMINE 15 MG/ML IJ SOLN: 15.0000 mg | Freq: Four times a day (QID) | INTRAMUSCULAR | Status: DC

## 2014-10-22 MED ORDER — OXYCODONE HCL 5 MG PO TABS: 5.0000 mg | ORAL_TABLET | ORAL | Status: DC | PRN

## 2014-10-22 MED ORDER — SODIUM CHLORIDE 0.9 % IV SOLN: 250.0000 mL | INTRAVENOUS | Status: DC | PRN

## 2014-10-22 MED ORDER — BUPIVACAINE HCL (PF) 0.25 % IJ SOLN
INTRAMUSCULAR | Status: DC | PRN
  Administered 2014-10-22: 13 mL

## 2014-10-22 SURGICAL SUPPLY — 46 items
BAG URINE DRAINAGE (UROLOGICAL SUPPLIES) IMPLANT
BENZOIN TINCTURE PRP APPL 2/3 (GAUZE/BANDAGES/DRESSINGS) ×3 IMPLANT
BLADE SURG 15 STRL LF DISP TIS (BLADE) ×1 IMPLANT
BLADE SURG 15 STRL SS (BLADE) ×2
CABLE HIGH FREQUENCY MONO STRZ (ELECTRODE) IMPLANT
CATH FOLEY 2WAY SLVR  5CC 16FR (CATHETERS)
CATH FOLEY 2WAY SLVR 5CC 16FR (CATHETERS) IMPLANT
CATH ROBINSON RED A/P 16FR (CATHETERS) IMPLANT
CHLORAPREP W/TINT 26ML (MISCELLANEOUS) ×6 IMPLANT
CLEANER TIP ELECTROSURG 2X2 (MISCELLANEOUS) ×3 IMPLANT
CLOSURE WOUND 1/2 X4 (GAUZE/BANDAGES/DRESSINGS) ×1
CONT SPEC 4OZ CLIKSEAL STRL BL (MISCELLANEOUS) ×3 IMPLANT
COVER MAYO STAND STRL (DRAPES) IMPLANT
DEVICE TROCAR PUNCTURE CLOSURE (ENDOMECHANICALS) IMPLANT
DRAPE TABLE BACK 44X90 PK DISP (DRAPES) IMPLANT
ELECT PENCIL ROCKER SW 15FT (MISCELLANEOUS) IMPLANT
ELECT REM PT RETURN 9FT ADLT (ELECTROSURGICAL) ×3
ELECTRODE REM PT RTRN 9FT ADLT (ELECTROSURGICAL) ×1 IMPLANT
GAUZE SPONGE 4X4 12PLY STRL (GAUZE/BANDAGES/DRESSINGS) ×3 IMPLANT
GAUZE SPONGE 4X4 16PLY XRAY LF (GAUZE/BANDAGES/DRESSINGS) IMPLANT
GLOVE BIO SURGEON STRL SZ 6.5 (GLOVE) ×10 IMPLANT
GLOVE BIO SURGEON STRL SZ7.5 (GLOVE) ×6 IMPLANT
GLOVE BIO SURGEONS STRL SZ 6.5 (GLOVE) ×5
GLOVE BIOGEL PI IND STRL 7.0 (GLOVE) ×1 IMPLANT
GLOVE BIOGEL PI INDICATOR 7.0 (GLOVE) ×2
HOLDER FOLEY CATH W/STRAP (MISCELLANEOUS) ×3 IMPLANT
KIT BASIN OR (CUSTOM PROCEDURE TRAY) ×3 IMPLANT
MANIPULATOR UTERINE 4.5 ZUMI (MISCELLANEOUS) IMPLANT
NS IRRIG 1000ML POUR BTL (IV SOLUTION) ×3 IMPLANT
PACK GENERAL/GYN (CUSTOM PROCEDURE TRAY) ×3 IMPLANT
POUCH SPECIMEN RETRIEVAL 10MM (ENDOMECHANICALS) ×6 IMPLANT
SCISSORS LAP 5X35 DISP (ENDOMECHANICALS) ×3 IMPLANT
SET IRRIG TUBING LAPAROSCOPIC (IRRIGATION / IRRIGATOR) ×3 IMPLANT
SHEET LAVH (DRAPES) ×3 IMPLANT
STRIP CLOSURE SKIN 1/2X4 (GAUZE/BANDAGES/DRESSINGS) ×2 IMPLANT
SUT VIC AB 3-0 PS2 18 (SUTURE) ×4
SUT VIC AB 3-0 PS2 18XBRD (SUTURE) ×2 IMPLANT
SUT VICRYL 0 UR6 27IN ABS (SUTURE) ×3 IMPLANT
SYR CONTROL 10ML LL (SYRINGE) ×3 IMPLANT
TOWEL OR 17X26 10 PK STRL BLUE (TOWEL DISPOSABLE) ×3 IMPLANT
TROCAR BLADELESS OPT 5 100 (ENDOMECHANICALS) ×9 IMPLANT
TROCAR XCEL 12X100 BLDLESS (ENDOMECHANICALS) ×3 IMPLANT
TROCAR XCEL BLUNT TIP 100MML (ENDOMECHANICALS) IMPLANT
TUBING NON-CON 1/4 X 20 CONN (TUBING) ×2 IMPLANT
TUBING NON-CON 1/4 X 20' CONN (TUBING) ×1
UNDERPAD 30X30 INCONTINENT (UNDERPADS AND DIAPERS) IMPLANT

## 2014-10-22 NOTE — H&P (View-Only) (Signed)
Consult Note: Gyn-Onc  [name] 34 y.o. female  CC:  Chief Complaint  Patient presents with  . 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 WHO score was 7 at diagnosis. B HCGs were followed closely but did not normalize and she was begun on methotrexate in August 2012. She had CT head+ CAP at [facility] in August. The marker progressively dropped until mid October, when treatment was changed to bolus actinomycin D at 1.25 mg/m2, single treatment given at [facility] on 11-22-10. Repeat CT Head +CAP was done at [facility] with stable 3.2 cm simple cyst in lateral left hepatic lobe and stable small low density lesion in central liver, with enlarging hypervascular mass in endometrial canal involving right wall of uterus, no evidence of metastatic disease. 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 She was given one pre-op dose of MTX day of surgery. B HCG dropped from 5919 to 6.4 by 02-16-11, then plateau'd (serial dilutions done and confirmed HCG). She had CT CAP on 03-15-11, with chest unremarkable, stable left hepatic cyst, no pelvic adenopathy, ovaries not remarkable with corpus luteal cyst, no CT evidence of metastatic disease. She was begun on actinomycin D 12 mcg/kg/day x5 q 2 weeks on 03-13-2011, with BHCG 11.9 on 03-14-11 and 21.1 on 03-21-11, but subsequently down to normal range (2.3) by 04-11-11. Present cycle 4, which began 04-25-11, is thus the second full cycle beyond normalization of marker.  She had been having regular HCGs with the most recent one on 4/14 being <2.0.  Her mother was diagnosed ovarian carcinosarcoma was found to be BRCA positive. 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 At the time of her  surgery she did undergo bilateral salpingectomy but did not undergo oophorectomy secondary to her young age.  Her mother passed away 2012/08/03.   Interval History: She had her mammogram in March of 2015 but has not had her MRI and wants to defer until the fall secondary to her finances. She had an ultrasound July 15, 2013  that revealed: FINDINGS:  Uterus is absent.  Right ovary measures 2.9 x 1.5 x 1.5 cm in length. There are physiologic follicles in the right ovary. There is an area of decreased echogenicity in the right ovary measuring 1.2 x 0.8 x 1.2 cm. This area of decreased echogenicity was not present on the previous study. Left ovary measures 2.8 x 1.6 x 2.2 cm. There are physiologic follicles in the left ovary. Beyond physiologic follicles on the left, there is no evidence of ovarian region mass. There is no free pelvic fluid.  IMPRESSION:  Small hypoechoic mass in the right ovary measuring 1.2 x 0.8 x 1.2 cm, not present previously. This small mass may represent a hemorrhagic cyst or small endometrioma. Given the clinical history, however, this area warrants close imaging surveillance. A followup ultrasound to evaluate the right ovary in 3 months would be warranted given this circumstance. Uterus absent. Study otherwise unremarkable.  CA 125 on July 11, 2013 was 6.7.  She had an ultrasound performed in April that revealed: IMPRESSION: 1. The right ovary is normal in appearance. 2. There is a 1.6 cm maximal dimension hypoechoic focus in the left ovary. Previously there was a similar size cystic structure here.  This may reflect interval hemorrhage into this cyst. A follow-up pelvic ultrasound examination in 6-8 weeks is recommended.  MMG 05/07/14: RECOMMENDATION: Screening mammogram at age 75. (Code:SM-B-40A)  BI-RADS CATEGORY 1: Negative.  She has had a face-to-face visit with the genetics counselors that reassured her that she most likely does have a BRCA mutation as it was a site  specific test. She had a CA-125 05/12/2014 that was 13.   MRI breast: IMPRESSION: Normal exam. No evidence of malignancy.  RECOMMENDATION: Screening mammogram in one year.(Code:SM-B-01Y).  Annual or biannual screening breast MRI given the patient's elevated risk of developing breast carcinoma.  BI-RADS CATEGORY 2: Benign..  Review of Systems: She's overall doing well and really denies any complaints.  Her 10 point review of systems is negative.   Current Meds:  Outpatient Encounter Prescriptions as of 10/01/2014  Medication Sig  . ibuprofen (ADVIL,MOTRIN) 200 MG tablet Take 400 mg by mouth daily as needed. For pain.   . Multiple Vitamins-Minerals (MULTIVITAMIN PO) Take 1 tablet by mouth daily.   No facility-administered encounter medications on file as of 10/01/2014.    Allergy: No Known Allergies  Social Hx:   Social History   Social History  . Marital Status: Married    Spouse Name: N/A  . Number of Children: N/A  . Years of Education: N/A   Occupational History  . Not on file.   Social History Main Topics  . Smoking status: Never Smoker   . Smokeless tobacco: Not on file  . Alcohol Use: Yes     Comment: rarely  . Drug Use: No  . Sexual Activity: Yes    Birth Control/ Protection: None   Other Topics Concern  . Not on file   Social History Narrative    Past Surgical Hx:  Past Surgical History  Procedure Laterality Date  . Cesarean section  2004, 2006  . Dilation and evacuation  08/12/2010    Procedure: DILATATION AND EVACUATION (D&E);  Surgeon: Melina Schools, MD;  Location: [facility];  Service: Gynecology;  Laterality: N/A;  . Dilation and curettage of uterus    . Abdominal hysterectomy  12/17/2010    Past Medical Hx:  Past Medical History  Diagnosis Date  . Cancer     hydatidiform mole  . 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 Family history of ovarian cancer   . BRCA1 positive     Family Hx:  Family History   Problem Relation Age of Onset  . Diabetes Other   . Diabetes Other   . Coronary artery disease Other   . Ovarian cancer Mother 61  . BRCA 1/2 Mother     BRCA1 mutation  . Ovarian cancer Maternal Aunt 69  . Breast cancer Cousin 8  . Cancer Other     "abdominal ca" in 3 sisters of mat grandfather    Vitals:  Blood pressure 136/80, pulse 72, temperature 98.5 F (36.9 C), temperature source Oral, resp. rate 16, height _0  (1.676 m), weight 174 lb 14.4 oz (79.334 kg), last menstrual period 05/31/2010.  Physical Exam: No acute distress  Neck: Supple, no lymphadenopathy, no thyromegaly.  Lungs: Clear to auscultation bilateral.  Cardiac: Regular rate and rhythm  Abdomen: Soft, nontender, nondistended. No palpable masses, no incisional hernias.  Pelvic: Normal external female genitalia. Bimanual examination reveals no masses or nodularity. I cannot appreciate her ovaries.   Assessment/Plan: 34 year old with gestational trophoblastic disease diagnosed and treated in 2012 and 2013 who has no evidence of  recurrent disease from that perspective. She was found to be BRCA1 positive as her mother had an ovarian carcinosarcoma. She has a 1.2 cm lesion on her right ovary that may be consistent with a small hemorrhagic cyst or endometrioma.  She has been seen by the genetics counselors and she brings in her NCCN guidelines with her today. She at this point is decided to proceed with a bilateral oophorectomy. She has previously had questions regarding hormone replacement therapy. I discussed with her that in a BRCA positive population who undergoes a bilateral salpingo-oophorectomy that there does not appear to be a significant increased risk of breast cancer short performed hormone replacement therapy.  We again reviewed the recommendations for removal of the ovaries in answer to questions regarding this. She is unsure when she was to have her breast removed. Her cousin was the next was diagnosed  with breast cancer 74 subtotally unfortunately died at the age of 35 from her breast cancer. Mrs. Jakel is uncertain about how she would like to proceed and had several questions regarding this. I discussed with her that this would be something definitely that would be discussed with her surgical oncologist as well as a Psychiatric nurse.  Her questions regarding removal of her ovaries were discussed with her. This is tentatively planned for September 21.     Levester Waldridge A., MD 10/01/2014, 10:45 AM

## 2014-10-22 NOTE — Progress Notes (Signed)
Patient states her sore throat is better. Has been taking Zyrtec

## 2014-10-22 NOTE — Brief Op Note (Signed)
10/22/2014  10:14 AM  PATIENT:  [name]  34 y.o. female  PRE-OPERATIVE DIAGNOSIS:  BRCA ONE positive   POST-OPERATIVE DIAGNOSIS:  BRCA One positive  PROCEDURE:  Procedure(s): BILATERAL LAPAROSCOPIC OOPHERECTOMY (Bilateral)  SURGEON:  Surgeon(s) and Role:    * Nancy Marus, MD - Primary    * Lahoma Crocker, MD - Assisting   ANESTHESIA:   general  EBL:  Total I/O In: 1000 [I.V.:1000] Out: 375 [Urine:325; Blood:50]  BLOOD ADMINISTERED:none  DRAINS: none   LOCAL MEDICATIONS USED:  13 ml 1.4% bupivicaine  SPECIMEN:  Aspirate, bilateral ovaries  DISPOSITION OF SPECIMEN:  PATHOLOGY  COUNTS:  YES  TOURNIQUET:  * No tourniquets in log *  DICTATION: .Dragon Dictation  PLAN OF CARE: Discharge to home after PACU  PATIENT DISPOSITION:  PACU - hemodynamically stable.   Delay start of Pharmacological VTE agent (>24hrs) due to surgical blood loss or risk of bleeding: not applicable

## 2014-10-22 NOTE — Interval H&P Note (Signed)
History and Physical Interval Note:  10/22/2014 8:23 AM  [name]  has presented today for surgery, with the diagnosis of BRCA ONE  The various methods of treatment have been discussed with the patient and family. After consideration of risks, benefits and other options for treatment, the patient has consented to  Procedure(s): BILATERAL LAPAROSCOPIC OOPHERECTOMY (Bilateral) as a surgical intervention .  The patient's history has been reviewed, patient examined, no change in status, stable for surgery.  I have reviewed the patient's chart and labs.  Questions were answered to the patient's satisfaction.     [place] Pass A.

## 2014-10-22 NOTE — Progress Notes (Addendum)
Patient complains of nausea with motion of sitting up in bed and smelling crackers. Status post bilateral oophorectomy.  1245  Nausea improved. Patient ambulates entire length of hallway and tolerates well.

## 2014-10-22 NOTE — Discharge Instructions (Signed)
Salpingooferectoma bilateral - Cuidados posteriores (Bilateral Salpingo-Oophorectomy, Care After) Siga estas instrucciones durante las prximas semanas. Estas indicaciones le proporcionan informacin general acerca de cmo deber cuidarse despus del procedimiento. El mdico tambin podr darle instrucciones ms especficas. El tratamiento se ha planificado de acuerdo a las prcticas mdicas actuales, pero a veces se producen problemas. Comunquese con el mdico si tiene algn problema o tiene dudas despus del procedimiento. QU ESPERAR DESPUS DEL PROCEDIMIENTO Despus del procedimiento, es tpico tener las siguientes sensaciones:   Dolor abdominal que puede controlarse con medicamentos.  Prdida o hemorragia vaginal.  Estreimiento.  Sntomas menopusicos como calores, sequedad vaginal y cambios de humor. INSTRUCCIONES PARA EL CUIDADO EN EL HOGAR   Descanse y duerma lo suficiente.  Tome slo medicamentos de venta libre o recetados, segn las indicaciones del mdico. No tome aspirina. Puede ocasionar hemorragias.  [place] y secas. Retire o [employer] apsitos (vendajes) slo como le indic su mdico.  Tome slo duchas y no baos, durante algunas semanas, segn las indicaciones de su mdico.  Limite las actividades segn las indicaciones del mdico. No levante ningn objeto que sea ms pesado que 5 libras (2.3 kg) hasta que el mdico la autorice.  No conduzca vehculos hasta que el mdico lo autorice.  Siga las indicaciones de su mdico con respecto al tratamiento para el asma antes de hacer actividad fsica. Puede retomar su dieta habitual inmediatamente.  Beba suficiente lquido para Consulting civil engineer orina clara o de color amarillo plido.  No se haga duchas vaginales ni tenga relaciones sexuales durante las 6 semanas siguientes a la [place].  No beba alcohol hasta que el mdico la autorice.  Tmese la [employer] veces por da y  Chartered certified accountant.  Si est constipada podr:   Consultar a su mdico si puede tomar un laxante suave.  Agregar frutas y salvado a su dieta.  Beber ms lquidos.  Concurra a las consultas de control con su mdico segn las indicaciones. SOLICITE ATENCIN MDICA SI:   La zona de la incisin est roja, se hincha o Engineer, water.  Tiene pus en el sitio de la incisin.  Advierte un olor ftido que proviene de la herida o del vendaje.  Tiene dolor, enrojecimiento o hinchazn en la zona en la que fue colocada la va intravenosa.  La incisin se ha abierto (los bordes no se mantienen juntos).  Se siente mareada o sufre un desmayo.  Siente dolor o tiene una hemorragia al [employer].  Tiene diarrea.  Presenta nuseas o vmitos.  [place] [employer] hemorragia vaginal anormal.  Le aparece una erupcin cutnea.  Aumenta el dolor y no puede controlarlo con Conservation officer, nature. SOLICITE ATENCIN MDICA DE INMEDIATO SI:   Tiene fiebre.  Siente dolor abdominal.  Siente dolor en el pecho.  Le falta el aire.  Se desmaya.  Siente dolor, u observa hinchazn o enrojecimiento en la pierna.  Tiene una hemorragia vaginal abundante, con o sin cogulos. Document Released: 04/13/2009 Document Revised: 09/19/2012 [facility] Patient Information 2015 [place]. This information is not intended to replace advice given to you by your health care provider. Make sure you discuss any questions you have with your health care provider.     General Anesthesia, Care After Refer to this sheet in the next few weeks. These instructions provide you with information on caring for yourself after your procedure. Your health care provider may also give you more specific instructions. Your treatment has been planned according to current medical practices,  but problems sometimes occur. Call your health care provider if you have any problems or questions after your procedure. WHAT TO EXPECT AFTER THE PROCEDURE After the  procedure, it is typical to experience:  Sleepiness.  Nausea and vomiting. HOME CARE INSTRUCTIONS  For the first 24 hours after general anesthesia:  Have a responsible person with you.  Do not drive a car. If you are alone, do not take public transportation.  Do not drink alcohol.  Do not take medicine that has not been prescribed by your health care provider.  Do not sign important papers or make important decisions.  You may resume a normal diet and activities as directed by your health care provider.  Change bandages (dressings) as directed.  If you have questions or problems that seem related to general anesthesia, call the hospital and ask for the anesthetist or anesthesiologist on call. SEEK MEDICAL CARE IF:  You have nausea and vomiting that continue the day after anesthesia.  You develop a rash. SEEK IMMEDIATE MEDICAL CARE IF:   You have difficulty breathing.  You have chest pain.  You have any allergic problems. Document Released: 04/25/2000 Document Revised: 01/22/2013 Document Reviewed: 08/02/2012 [facility] Patient Information 2015 [place], [place]. This information is not intended to replace advice given to you by your health care provider. Make sure you discuss any questions you have with your health care provider.

## 2014-10-22 NOTE — Op Note (Signed)
PATIENT: [name] DATE OF BIRTH: 12/02/1980 ENCOUNTER DATE: 10/22/14   Preop Diagnosis: BRCA1 +  Postoperative Diagnosis: same.   Surgery: Laparoscopic bilateral oophorectomy  Surgeons:  Imagene Gurney A. Alycia Rossetti, MD; Lahoma Crocker, MD   Anesthesia: General   Estimated blood loss: 50  ml   IVF: 1300 ml   Urine output: 032 ml   Complications: None   Pathology: Bilateral ovaries, pelvic washings  Operative findings: Normal abdominal survey. Surgically absent uterus, cervix, bilateral fallopian tubes. Adhesive disease of the terminal ileum to the right pelvic sidewall.  Procedure: The patient was identified in the preoperative holding area. Informed consent was signed on the chart. Patient was seen history was reviewed and exam was performed.   The patient was then taken to the operating room and placed in the supine position with SCD hose on. She was then placed in the dorsolithotomy position. Her arms were tucked at her side with appropriate precautions on the gel pad. General anesthesia was then induced without difficulty. Shoulder blocks were then placed in the usual fashion with appropriate precautions. A OG-tube was placed to suction. First timeout was performed to confirm the patient, procedure, antibiotic, allergy status, estimated blood loss and OR time. The perineum was then prepped in the usual fashion with Betadine. A 14 French Foley was inserted into the bladder under sterile conditions. The abdomen was then prepped with 2 Chlor prep sponges per protocol.   Patient was then draped after the prep was dried. Second timeout was performed to confirm the above. After again confirming OG tube placement and it was to suction. A stab-wound was made in left upper quadrant 2 cm below the costal margin on the left in the midclavicular line. A 5 mm operative report was used to assure intra-abdominal placement. The abdomen was insufflated. At this point all points during the procedure the  patient's intra-abdominal pressure was not increased over 15 mm of mercury. After insufflation was complete, the patient was placed in deep Trendelenburg position.  Bilateral 5 mm ports were marked 10 cm from the midline incision at approximately 5 angle in her prior incision sites. Under direct visualization each of the trochars was placed into the abdomen. A 10/12 suprapubic trocar was placed in her prior cesarean section incision   The adhesive disease of the small bowel to the right pelvic side was taken down with meticulous sharp dissection. Abdominal pelvic washings were obtained. The posterior leaf of the broad ligament on the right side was opened. The ureter was identified. A window was made between the ureter and the infundibulopelvic vessels. Once this window was created bipolar cautery was used on the infundibulopelvic vessels. There were then transected. There is in an Endo Catch bag and delivered through the 10/12 suprapubic port. The same procedure was performed on the patient's left side. There was adequate hemostasis. The abdomen and pelvis were irrigated.  The ports were removed in the abdomen was desufflated. The suprapubic port site fashion was closed with a UR 6 0-vicryl. The skin was closed using 4.0 vicryl. Steri-Strips were placed on the incisions. Quarter percent Marcaine was injected both pre-incision as well as after the incisions were closed.  All instrument needle and Ray-Tec counts were correct x2. The Foley catheter was removed from the bladder. All specimens were sent to pathology. I ensured with a circulator the pathology is aware that the patient had a BRCA mutation and that the ovaries were to be serially sectioned. The patient tolerated the procedure well and  was taken to the recovery room in stable condition. This is [name] dictating an operative note on patient [name].

## 2014-10-22 NOTE — Anesthesia Procedure Notes (Signed)
Procedure Name: Intubation Date/Time: 10/22/2014 8:46 AM Performed by: Lissa Morales Pre-anesthesia Checklist: Patient identified, Emergency Drugs available, Suction available and Patient being monitored Patient Re-evaluated:Patient Re-evaluated prior to inductionOxygen Delivery Method: Circle System Utilized Preoxygenation: Pre-oxygenation with 100% oxygen Intubation Type: IV induction Ventilation: Mask ventilation without difficulty Grade View: Grade II Tube type: Oral Tube size: 7.5 mm Number of attempts: 1 Airway Equipment and Method: Stylet and Oral airway (Mouth very small changed blade to MAC 3 instead of 4) Placement Confirmation: ETT inserted through vocal cords under direct vision,  positive ETCO2 and breath sounds checked- equal and bilateral Secured at: 21 cm Tube secured with: Tape Dental Injury: Teeth and Oropharynx as per pre-operative assessment

## 2014-10-22 NOTE — Anesthesia Postprocedure Evaluation (Signed)
  Anesthesia Post-op Note  Patient: [name]  Procedure(s) Performed: Procedure(s) (LRB): BILATERAL LAPAROSCOPIC OOPHERECTOMY (Bilateral)  Patient Location: PACU  Anesthesia Type: General  Level of Consciousness: awake and alert   Airway and Oxygen Therapy: Patient Spontanous Breathing  Post-op Pain: mild  Post-op Assessment: Post-op Vital signs reviewed, Patient's Cardiovascular Status Stable, Respiratory Function Stable, Patent Airway and No signs of Nausea or vomiting  Last Vitals:  Filed Vitals:   10/22/14 1110  BP: 105/60  Pulse: 60  Temp: 37 C  Resp: 14    Post-op Vital Signs: stable   Complications: No apparent anesthesia complications

## 2014-10-22 NOTE — Transfer of Care (Signed)
Immediate Anesthesia Transfer of Care Note  Patient: [name]  Procedure(s) Performed: Procedure(s): BILATERAL LAPAROSCOPIC OOPHERECTOMY (Bilateral)  Patient Location: PACU  Anesthesia Type:General  Level of Consciousness: awake, alert , oriented and patient cooperative  Airway & Oxygen Therapy: Patient Spontanous Breathing and Patient connected to face mask oxygen  Post-op Assessment: Report given to RN, Post -op Vital signs reviewed and stable and Patient moving all extremities X 4  Post vital signs: stable  Last Vitals:  Filed Vitals:   10/22/14 1030  BP:   Pulse: 62  Temp: 36.8 C  Resp: 17    Complications: No apparent anesthesia complications

## 2014-10-24 ENCOUNTER — Telehealth: Payer: Self-pay | Admitting: *Deleted

## 2014-10-24 NOTE — Telephone Encounter (Signed)
Per Joylene John, NP patient called and notified of surgical pathology. No post-op concerns noted at this time - pt states she is voiding and having normal bowel movements. She states she took the prescribed pain medication once after surgery but is only taking Advil now. While on the phone, patient scheduled for 4 week post-op appt for 11/17/14 - patient agreeable to scheduled appt. No other concerns noted at this time and pt instructed to call our office if any concerns arise prior to appt.

## 2014-11-12 ENCOUNTER — Other Ambulatory Visit: Payer: Self-pay | Admitting: Oncology

## 2014-11-12 DIAGNOSIS — Z1501 Genetic susceptibility to malignant neoplasm of breast: Secondary | ICD-10-CM

## 2014-11-12 DIAGNOSIS — Z1509 Genetic susceptibility to other malignant neoplasm: Principal | ICD-10-CM

## 2014-11-17 ENCOUNTER — Encounter: Payer: Self-pay | Admitting: Gynecologic Oncology

## 2014-11-17 ENCOUNTER — Ambulatory Visit: Payer: 59 | Attending: Gynecologic Oncology | Admitting: Gynecologic Oncology

## 2014-11-17 ENCOUNTER — Other Ambulatory Visit (HOSPITAL_BASED_OUTPATIENT_CLINIC_OR_DEPARTMENT_OTHER): Payer: 59

## 2014-11-17 ENCOUNTER — Telehealth: Payer: Self-pay | Admitting: Oncology

## 2014-11-17 ENCOUNTER — Encounter: Payer: Self-pay | Admitting: Oncology

## 2014-11-17 ENCOUNTER — Ambulatory Visit (HOSPITAL_BASED_OUTPATIENT_CLINIC_OR_DEPARTMENT_OTHER): Payer: 59 | Admitting: Oncology

## 2014-11-17 VITALS — BP 102/60 | HR 61 | Temp 98.0°F | Resp 18 | Ht 67.0 in | Wt 177.7 lb

## 2014-11-17 VITALS — BP 121/73 | HR 59 | Temp 98.3°F | Resp 18 | Ht 67.0 in | Wt 177.7 lb

## 2014-11-17 DIAGNOSIS — Z803 Family history of malignant neoplasm of breast: Secondary | ICD-10-CM

## 2014-11-17 DIAGNOSIS — Z1509 Genetic susceptibility to other malignant neoplasm: Principal | ICD-10-CM

## 2014-11-17 DIAGNOSIS — N958 Other specified menopausal and perimenopausal disorders: Secondary | ICD-10-CM | POA: Diagnosis not present

## 2014-11-17 DIAGNOSIS — Z8759 Personal history of other complications of pregnancy, childbirth and the puerperium: Secondary | ICD-10-CM | POA: Diagnosis not present

## 2014-11-17 DIAGNOSIS — E8940 Asymptomatic postprocedural ovarian failure: Secondary | ICD-10-CM

## 2014-11-17 DIAGNOSIS — Z1501 Genetic susceptibility to malignant neoplasm of breast: Secondary | ICD-10-CM | POA: Diagnosis not present

## 2014-11-17 DIAGNOSIS — Z90722 Acquired absence of ovaries, bilateral: Secondary | ICD-10-CM

## 2014-11-17 DIAGNOSIS — E8941 Symptomatic postprocedural ovarian failure: Secondary | ICD-10-CM

## 2014-11-17 LAB — CBC WITH DIFFERENTIAL/PLATELET
BASO%: 0.7 % (ref 0.0–2.0)
BASOS ABS: 0 10*3/uL (ref 0.0–0.1)
EOS ABS: 0.1 10*3/uL (ref 0.0–0.5)
EOS%: 1.4 % (ref 0.0–7.0)
HEMATOCRIT: 38.4 % (ref 34.8–46.6)
HGB: 13 g/dL (ref 11.6–15.9)
LYMPH#: 1.4 10*3/uL (ref 0.9–3.3)
LYMPH%: 26 % (ref 14.0–49.7)
MCH: 29.7 pg (ref 25.1–34.0)
MCHC: 33.8 g/dL (ref 31.5–36.0)
MCV: 87.8 fL (ref 79.5–101.0)
MONO#: 0.4 10*3/uL (ref 0.1–0.9)
MONO%: 7.4 % (ref 0.0–14.0)
NEUT#: 3.5 10*3/uL (ref 1.5–6.5)
NEUT%: 64.5 % (ref 38.4–76.8)
PLATELETS: 184 10*3/uL (ref 145–400)
RBC: 4.37 10*6/uL (ref 3.70–5.45)
RDW: 12.9 % (ref 11.2–14.5)
WBC: 5.4 10*3/uL (ref 3.9–10.3)

## 2014-11-17 LAB — COMPREHENSIVE METABOLIC PANEL (CC13)
ALT: 26 U/L (ref 0–55)
ANION GAP: 7 meq/L (ref 3–11)
AST: 24 U/L (ref 5–34)
Albumin: 4.2 g/dL (ref 3.5–5.0)
Alkaline Phosphatase: 69 U/L (ref 40–150)
BUN: 14.7 mg/dL (ref 7.0–26.0)
CALCIUM: 9.6 mg/dL (ref 8.4–10.4)
CHLORIDE: 107 meq/L (ref 98–109)
CO2: 27 mEq/L (ref 22–29)
Creatinine: 0.8 mg/dL (ref 0.6–1.1)
Glucose: 104 mg/dl (ref 70–140)
POTASSIUM: 4.3 meq/L (ref 3.5–5.1)
Sodium: 141 mEq/L (ref 136–145)
Total Bilirubin: 0.48 mg/dL (ref 0.20–1.20)
Total Protein: 7.5 g/dL (ref 6.4–8.3)

## 2014-11-17 MED ORDER — ESTRADIOL 0.0375 MG/24HR TD PTWK: 0.0375 mg | MEDICATED_PATCH | TRANSDERMAL | Status: DC

## 2014-11-17 NOTE — Patient Instructions (Signed)
No follow required , call with any questions or concerns [phone]

## 2014-11-17 NOTE — Telephone Encounter (Signed)
Called and left a message with 07/2015 appointment and and faxed her mammo order as hold time was 12+minutes

## 2014-11-17 NOTE — Progress Notes (Signed)
POSTOPERATIVE FOLLOWUP  Assessment:    34 y.o. year old with a deleterious mutation of BRCA1.   S/p laparoscopic bilateral oohoporectomy on 10/22/14.   Plan: 1) Pathology reports reviewed today 2) Treatment counseling - I discussed with the patient the pro's and con's of estrogen replacement therapy after surgical castration at a premenopausal age. We discussed the WHO study and I discussed that this evaluated a different patient population (postmenopausal and taking to my progesterone and estrogen). I discussed that she does not carry a personal history of breast cancer (and that BRCA associated breast cancers are typically hormone receptor negative). I also discussed that the levels of estrogen administered in HRT is less than what she derived from her ovaries.  I discussed that women who have their ovaries removed (in a benign context as is the case for [name]) prior to age 26 have reduced life expectancy, primarily due to increased coronary and cerebrovascular risk and osteoporosis risks.   It is my recommendations therefore that she utilize estrogen replacement therapy until age of natural menopause (approximately age 13).  I discussed that she remains at increased risk for breast cancer, however, this is due to her BRCA1 deleterious mutation and is not elevated with estrogen replacement.  I prescribed her low dose climara patch and she will think about things further before she decides to take.   She was given the opportunity to ask questions, which were answered to her satisfaction, and she is agreement with the above mentioned plan of care.  3)  Return to clinic on a prn basis.   HPI:  [name] is a 34 y.o. year old G27P2010 initially seen in consultation on October 2012 for a deleterious mutation in [place].  She then underwent a laparascopic bilateral oophorectomy on 3/35/45 without complications (she had previously had a hysterectomy and salpingectomy).  Her postoperative course  was uncomplicted.  Her final pathology revealed no malignancy.  She is seen today for a postoperative check and to discuss her pathology results and ongoing plan.  Since discharge from the hospital, she is feeling well. She has moderate but tolerable menopausal symptoms.  She has improving appetite, normal bowel and bladder function, and pain controlled with minimal PO medication. She has no other complaints today.    Review of systems: Constitutional:  She has no weight gain or weight loss. She has no fever or chills. Eyes: No blurred vision Ears, Nose, Mouth, Throat: No dizziness, headaches or changes in hearing. No mouth sores. Cardiovascular: No chest pain, palpitations or edema. Respiratory:  No shortness of breath, wheezing or cough Gastrointestinal: She has normal bowel movements without diarrhea or constipation. She denies any nausea or vomiting. She denies blood in her stool or heart burn. Genitourinary:  She denies pelvic pain, pelvic pressure or changes in her urinary function. She has no hematuria, dysuria, or incontinence. She has no irregular vaginal bleeding or vaginal discharge Musculoskeletal: Denies muscle weakness or joint pains.  Skin:  She has no skin changes, rashes or itching Neurological:  Denies dizziness or headaches. No neuropathy, no numbness or tingling. Psychiatric:  She denies depression or anxiety. Hematologic/Lymphatic:   No easy bruising or bleeding   Physical Exam: Blood pressure 121/73, pulse 59, temperature 98.3 F (36.8 C), temperature source Oral, resp. rate 18, height 5' 7" (1.702 m), weight 177 lb 11.2 oz (80.604 kg), last menstrual period 05/31/2010, SpO2 99 %. General: Well dressed, well nourished in no apparent distress.   HEENT:  Normocephalic and atraumatic, no  lesions.  Extraocular muscles intact. Sclerae anicteric. Pupils equal, round, reactive. No mouth sores or ulcers. Thyroid is normal size, not nodular, midline. Skin:  No lesions or  rashes. Abdomen:  Soft, nontender, nondistended.  No palpable masses.  No hepatosplenomegaly.  No ascites. Normal bowel sounds.  No hernias.  Incisions are well healed Genitourinary: deferred Extremities: No cyanosis, clubbing or edema.  No calf tenderness or erythema. No palpable cords. Psychiatric: Mood and affect are appropriate. Neurological: Awake, alert and oriented x 3. Sensation is intact, no neuropathy.  Musculoskeletal: No pain, normal strength and range of motion.  Donaciano Eva, MD

## 2014-11-17 NOTE — Progress Notes (Signed)
OFFICE PROGRESS NOTE   November 17, 2014   Physicians:P.Gehrig, T.Marquette Saa (PCP [place])  INTERVAL HISTORY:  Patient is seen, alone for visit, followed for BRCA 1 mutation; previously she was treated for gestational trophoblastic disease with resolution. [name] had bilateral oophorectomy by Dr Alycia Rossetti on 10-22-14. She previously had hysterectomy and bilateral salpingectomy with the GTD. She had mammograms at [facility] 05-07-14 and breast MRI 07-28-14.   She did well with the oophorectomy surgery, which was laparoscopic; intraoperatively she was noted to have adhesion of terminal ileum to right pelvic sidewall. Pathology [id number]) benign ovaries. As expected, she has had significant hot flashes, tho she believes these are less severe than just after the surgery. She understands need to do regular weight bearing exercise and to get adequate calcium in diet, which I have encouraged her to do primarily thru food (MVI has 500 mg calcium + D also). She has had no fever, no abdominal or pelvic pain, no noted changes in breasts. She has otherwise felt well.  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 Per Dr.Gehrig's subsequent consult note, WHO score was 7 at diagnosis. B HCGs were followed closely but did not normalize and she was begun on methotrexate in August 2012. She had CT head+ CAP at [facility] in August. The marker progressively dropped until mid October, when treatment was changed to bolus actinomycinD at 1.25 mg/m2, single treatment given at [facility] on 11-22-10. Repeat CT Head +CAP was done at [facility]11-[facility]-12 with stable 3.2 cm simple cyst in lateral left hepatic lobe and stable small low density lesion in central liver, with enlarging hypervascular mass in endometrial canal involving right wall of uterus, no evidence of metastatic disease. 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 She was given one pre-op dose of MTX day of surgery. B HCG dropped from 5919 to 6.4 by 02-16-11, then plateau'd (serial dilutions done and confirmed HCG). She had CT CAP on 03-15-11, with chest unremarkable, stable left hepatic cyst, no pelvic adenopathy, ovaries not remarkable with corpus luteal cyst, no CT evidence of metastatic disease. She was begun on actinomycin D 12 mcg/kg/day x5 q 2 weeks on 03-13-2011, with BHCG 11.9 on 03-14-11 and 21.1 on 03-21-11, but subsequently down to normal range (2.3) by 04-11-11. Cycle 4 (day 1 on 04-25-11) was the second full cycle beyond normalization of marker.  After gestational trophoblastic disease was resolved, patient's mother developed ovarian carcinosarcoma and was found to be BRCA 1 positive; [name] was also tested and is BRCA 1 positive. She had bilateral oophorectomy 10-22-14, with benign ovaries on path.   Review of systems as above, also: No SOB, good appetite, good energy. No bleeding. No new or different pain Remainder of 10 point Review of Systems negative.  Objective:  Vital signs in last 24 hours:  BP 102/60 mmHg  Pulse 61  Temp(Src) 98 F (36.7 C) (Oral)  Resp 18  Ht 5' 7"  (1.702 m)  Wt 177 lb 11.2 oz (80.604 kg)  BMI 27.83 kg/m2  SpO2 99%  LMP 05/31/2010 Weight up 3 lbs. Alert, oriented and appropriate. Easily ambulatory.   HEENT:PERRL, sclerae not icteric. Oral mucosa moist without lesions, posterior pharynx clear.  Neck supple. No JVD.  Lymphatics:no cervical,supraclavicular,  axillary or inguinal adenopathy Resp: clear to auscultation bilaterally and normal percussion bilaterally Cardio: regular rate and rhythm. No gallop. GI: soft, nontender, not distended, no mass or organomegaly. Normally active bowel sounds. Surgical incisions not  remarkable. Musculoskeletal/ Extremities: without pitting edema, cords, tenderness Neuro:  nonfocal  PSYCH appropriate mood and affect Skin without rash, ecchymosis, petechiae Breasts: without dominant mass, skin or nipple findings. Axillae benign.   Lab Results:  Results for orders placed or performed in visit on 11/17/14  CBC with Differential  Result Value Ref Range   WBC 5.4 3.9 - 10.3 10e3/uL   NEUT# 3.5 1.5 - 6.5 10e3/uL   HGB 13.0 11.6 - 15.9 g/dL   HCT 38.4 34.8 - 46.6 %   Platelets 184 145 - 400 10e3/uL   MCV 87.8 79.5 - 101.0 fL   MCH 29.7 25.1 - 34.0 pg   MCHC 33.8 31.5 - 36.0 g/dL   RBC 4.37 3.70 - 5.45 10e6/uL   RDW 12.9 11.2 - 14.5 %   lymph# 1.4 0.9 - 3.3 10e3/uL   MONO# 0.4 0.1 - 0.9 10e3/uL   Eosinophils Absolute 0.1 0.0 - 0.5 10e3/uL   Basophils Absolute 0.0 0.0 - 0.1 10e3/uL   NEUT% 64.5 38.4 - 76.8 %   LYMPH% 26.0 14.0 - 49.7 %   MONO% 7.4 0.0 - 14.0 %   EOS% 1.4 0.0 - 7.0 %   BASO% 0.7 0.0 - 2.0 %  Comprehensive metabolic panel (Cmet) - [facility]  Result Value Ref Range   Sodium 141 136 - 145 mEq/L   Potassium 4.3 3.5 - 5.1 mEq/L   Chloride 107 98 - 109 mEq/L   CO2 27 22 - 29 mEq/L   Glucose 104 70 - 140 mg/dl   BUN 14.7 7.0 - 26.0 mg/dL   Creatinine 0.8 0.6 - 1.1 mg/dL   Total Bilirubin 0.48 0.20 - 1.20 mg/dL   Alkaline Phosphatase 69 40 - 150 U/L   AST 24 5 - 34 U/L   ALT 26 0 - 55 U/L   Total Protein 7.5 6.4 - 8.3 g/dL   Albumin 4.2 3.5 - 5.0 g/dL   Calcium 9.6 8.4 - 10.4 mg/dL   Anion Gap 7 3 - 11 mEq/L   EGFR >90 >90 ml/min/1.73 m2     Studies/Results:  BRIGETT, ESTELL C Collected: 10/22/2014 Client: [facility] Accession: [id number] Received: 10/22/2014 Nancy Marus REPORT OF SURGICAL PATHOLOGY FINAL DIAGNOSIS Diagnosis 1. Ovary, right - BENIGN OVARY - NO TUMOR SEEN. 2. Ovary, left - BENIGN OVARY - NO TUMOR SEEN.   Medications: I have reviewed the patient's current medications.  DISCUSSION: She is relieved that she has  had the oophorectomy, will follow up with gyn oncology for post op visit also today. She will have bilateral mammograms in April, then MRI breast within 3 months after mammogram, this medically necessary due to high risk from BRCA 1 mutation. I will see her back after the mammograms.   Assessment/Plan:  1.BRCA 1 positive 34 yo lady: Strong FH ovarian and breast ca in maternal relatives including recent diagnosis of metastatic breast in 35 yo maternal first cousin. Now post bilateral oophorectomy in preventative attempt. Will follow breast imaging closely as above, and certainly reasonable to consider prophylactic bilateral mastectomies. 2.post bilateral oophorectomy 10-22-14, with previous hysterectomy bilateral salpingectomy 3.gestational trophoblastic disease 2012, treated with MTX x 1 prior to hysterectomy with bilateral salpingectomy then 4 cycles actinomycin D (=2 beyond normalization of marker) thru 04-2011. She had CR, and was  followed with serial BHCGs x 1 year without concerns. 4.flu vaccine done   All questions answered and patient knows to call if concerns prior to next scheduled visit. Time spent 20  min including >50% counseling and coordination of care.   Tyce Delcid P, MD   11/17/2014, 8:56 PM

## 2015-06-22 ENCOUNTER — Encounter: Payer: Self-pay | Admitting: Oncology

## 2015-06-22 NOTE — Telephone Encounter (Signed)
Told Ms [name]  that she was due mammograms after April 6th of this year.  The order is in the Epic system so she can call the [facility] to schedule.  She can keep Dr. Mariana Kaufman appointment R/S to 07-09-2015 from 07-06-2015 due to patient working. Dr Marko Plume can order the Breast MRI's when she sees her on 07-09-15. [name] verbalized understanding.

## 2015-06-25 ENCOUNTER — Other Ambulatory Visit: Payer: Self-pay

## 2015-06-25 DIAGNOSIS — Z1231 Encounter for screening mammogram for malignant neoplasm of breast: Secondary | ICD-10-CM

## 2015-07-05 ENCOUNTER — Other Ambulatory Visit: Payer: Self-pay | Admitting: Oncology

## 2015-07-05 DIAGNOSIS — Z1509 Genetic susceptibility to other malignant neoplasm: Principal | ICD-10-CM

## 2015-07-05 DIAGNOSIS — Z1501 Genetic susceptibility to malignant neoplasm of breast: Secondary | ICD-10-CM

## 2015-07-06 ENCOUNTER — Ambulatory Visit: Payer: 59 | Admitting: Oncology

## 2015-07-06 ENCOUNTER — Other Ambulatory Visit: Payer: 59

## 2015-07-08 ENCOUNTER — Ambulatory Visit
Admission: RE | Admit: 2015-07-08 | Discharge: 2015-07-08 | Disposition: A | Discharge status: Home / Self Care | Payer: 59 | Source: Ambulatory Visit

## 2015-07-08 DIAGNOSIS — Z1231 Encounter for screening mammogram for malignant neoplasm of breast: Secondary | ICD-10-CM | POA: Diagnosis not present

## 2015-07-09 ENCOUNTER — Ambulatory Visit (HOSPITAL_BASED_OUTPATIENT_CLINIC_OR_DEPARTMENT_OTHER): Payer: 59 | Admitting: Oncology

## 2015-07-09 ENCOUNTER — Encounter: Payer: Self-pay | Admitting: Oncology

## 2015-07-09 ENCOUNTER — Other Ambulatory Visit: Payer: 59

## 2015-07-09 ENCOUNTER — Telehealth: Payer: Self-pay | Admitting: Oncology

## 2015-07-09 VITALS — BP 114/73 | HR 64 | Temp 98.0°F | Resp 18 | Ht 67.0 in | Wt 189.8 lb

## 2015-07-09 DIAGNOSIS — Z90710 Acquired absence of both cervix and uterus: Secondary | ICD-10-CM

## 2015-07-09 DIAGNOSIS — M79601 Pain in right arm: Secondary | ICD-10-CM

## 2015-07-09 DIAGNOSIS — Z90722 Acquired absence of ovaries, bilateral: Secondary | ICD-10-CM

## 2015-07-09 DIAGNOSIS — Z1509 Genetic susceptibility to other malignant neoplasm: Principal | ICD-10-CM

## 2015-07-09 DIAGNOSIS — Z1501 Genetic susceptibility to malignant neoplasm of breast: Secondary | ICD-10-CM | POA: Diagnosis not present

## 2015-07-09 DIAGNOSIS — E8940 Asymptomatic postprocedural ovarian failure: Secondary | ICD-10-CM | POA: Diagnosis not present

## 2015-07-09 DIAGNOSIS — Z9079 Acquired absence of other genital organ(s): Secondary | ICD-10-CM

## 2015-07-09 NOTE — Telephone Encounter (Signed)
appt made per LL 6/8 pof

## 2015-07-09 NOTE — Progress Notes (Signed)
OFFICE PROGRESS NOTE   July 09, 2015   Physicians: P.Gehrig, T.Marquette Saa (PCP [place])  INTERVAL HISTORY:  Patient is seen, alone for visit, in scheduled follow up of BRCA 1 mutation which was diagnosed after her mother's ovarian malignancy. Patient has had no BRCA related cancers, did have gestational trophoblastic disease which was curatively treated in 2012 including hysterectomy with salpingectomy, then oophorectomy 10-2014 after BRCA mutation identified. She is considering prophylactic mastectomies. She had bilateral screening mammograms 07-08-15, scattered fibroglandular density and no mammographic findings of concern. She had breast MRI 07-2015 and should have this again within 3 months of present mammograms.  She is on supplemental estrogen by gyn oncology (Dr Denman George), see below. She has prn follow up now with gyn oncology  Patient has generally felt well since she was here last, with exception of vague RUE soreness which has been somewhat intermittent, possible associated with slight fluid retention, no trauma. The soreness is in upper arm and forearm, not hand, not really tender to touch, minimal swelling possibly, full good range of motion shoulder and lower joints, no neck pain. She has occasionally used tylenol for this discomfort, and thought it was a little better when she held estrogen patch x ~ 6 weeks.   She has noticed no changes in breasts. Hot flashes are completely controlled on estrogen patch. No abdominal or pelvic discomfort. No bleeding. No LE swelling. No SOB or other respiratory symptoms. No recent illness. Remainder of 10 point Review of Systems negative/ unchanged.  No central catheter BRCA 1 mutation  [place] and her children are going to [place] x 5 weeks this summer, children bilingual. She is working as Therapist, sports at [employer], including extra shifts and night shifts.  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 Per Dr.Gehrig's subsequent consult note, WHO score was 7 at diagnosis. B HCGs were followed closely but did not normalize and she was begun on methotrexate in August 2012. She had CT head+ CAP at [facility] in August. The marker progressively dropped until mid October, when treatment was changed to bolus actinomycinD at 1.25 mg/m2, single treatment given at [facility] on 11-22-10. Repeat CT Head +CAP was done at [facility] with stable 3.2 cm simple cyst in lateral left hepatic lobe and stable small low density lesion in central liver, with enlarging hypervascular mass in endometrial canal involving right wall of uterus, no evidence of metastatic disease. 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 She was given one pre-op dose of MTX day of surgery. B HCG dropped from 5919 to 6.4 by 02-16-11, then plateau'd (serial dilutions done and confirmed HCG). She had CT CAP on 03-15-11, with chest unremarkable, stable left hepatic cyst, no pelvic adenopathy, ovaries not remarkable with corpus luteal cyst, no CT evidence of metastatic disease. She was begun on actinomycin D 12 mcg/kg/day x5 q 2 weeks on 03-13-2011, with BHCG 11.9 on 03-14-11 and 21.1 on 03-21-11, but subsequently down to normal range (2.3) by 04-11-11. Cycle 4 (day 1 on 04-25-11) was the second full cycle beyond normalization of marker.  After gestational trophoblastic disease was resolved, patient's mother developed ovarian carcinosarcoma and was found to be BRCA 1 positive; Emary then found also BRCA 1 positive. She had bilateral oophorectomy 10-22-14, with benign ovaries on path.  Objective:  Vital signs in last 24 hours:  BP 114/73 mmHg  Pulse 64  Temp(Src) 98 F (36.7 C) (Oral)  Resp 18  Ht 5' 7"  (1.702 m)  Wt 189 lb 12.8 oz (86.093 kg)  BMI 29.72  kg/m2  SpO2 98%  LMP 05/31/2010 Weight up 11 lbs Alert, oriented and appropriate. Ambulatory without assistance.    HEENT:PERRL, sclerae not icteric. Oral mucosa moist without lesions, posterior pharynx clear.  Neck supple. No JVD.  Lymphatics:no cervical,supraclavicular, axillary or inguinal adenopathy Resp: clear to auscultation bilaterally and normal percussion bilaterally Cardio: regular rate and rhythm. No gallop. GI: soft, nontender, not distended, no mass or organomegaly. Normally active bowel sounds. Surgical incision not remarkable. Musculoskeletal/ Extremities: UE/ LE without pitting edema, cords, or tenderness to palpation. Cervical spine not tender. Full easy ROM right shoulder Neuro: nonfocal  Psych appropriate mood and affect Skin without rash, ecchymosis, petechiae Breasts: bilaterally without dominant mass, skin or nipple findings. Axillae benign.   Lab Results:  Results for orders placed or performed in visit on 11/17/14  CBC with Differential  Result Value Ref Range   WBC 5.4 3.9 - 10.3 10e3/uL   NEUT# 3.5 1.5 - 6.5 10e3/uL   HGB 13.0 11.6 - 15.9 g/dL   HCT 38.4 34.8 - 46.6 %   Platelets 184 145 - 400 10e3/uL   MCV 87.8 79.5 - 101.0 fL   MCH 29.7 25.1 - 34.0 pg   MCHC 33.8 31.5 - 36.0 g/dL   RBC 4.37 3.70 - 5.45 10e6/uL   RDW 12.9 11.2 - 14.5 %   lymph# 1.4 0.9 - 3.3 10e3/uL   MONO# 0.4 0.1 - 0.9 10e3/uL   Eosinophils Absolute 0.1 0.0 - 0.5 10e3/uL   Basophils Absolute 0.0 0.0 - 0.1 10e3/uL   NEUT% 64.5 38.4 - 76.8 %   LYMPH% 26.0 14.0 - 49.7 %   MONO% 7.4 0.0 - 14.0 %   EOS% 1.4 0.0 - 7.0 %   BASO% 0.7 0.0 - 2.0 %  Comprehensive metabolic panel (Cmet) - [facility]  Result Value Ref Range   Sodium 141 136 - 145 mEq/L   Potassium 4.3 3.5 - 5.1 mEq/L   Chloride 107 98 - 109 mEq/L   CO2 27 22 - 29 mEq/L   Glucose 104 70 - 140 mg/dl   BUN 14.7 7.0 - 26.0 mg/dL   Creatinine 0.8 0.6 - 1.1 mg/dL   Total Bilirubin 0.48 0.20 - 1.20 mg/dL   Alkaline Phosphatase  69 40 - 150 U/L   AST 24 5 - 34 U/L   ALT 26 0 - 55 U/L   Total Protein 7.5 6.4 - 8.3 g/dL   Albumin 4.2 3.5 - 5.0 g/dL   Calcium 9.6 8.4 - 10.4 mg/dL   Anion Gap 7 3 - 11 mEq/L   EGFR >90 >90 ml/min/1.73 m2     Studies/Results:  Mm Digital Screening Bilateral  07/08/2015  CLINICAL DATA:  Screening. EXAM: DIGITAL SCREENING BILATERAL MAMMOGRAM WITH CAD COMPARISON:  Previous exam(s). ACR Breast Density Category b: There are scattered areas of fibroglandular density. FINDINGS: There are no findings suspicious for malignancy. Images were processed with CAD. IMPRESSION: No mammographic evidence of malignancy. A result letter of this screening mammogram will be mailed directly to the patient. RECOMMENDATION: Screening mammogram at age 60. (Code:SM-B-40A) BI-RADS CATEGORY  1: Negative. Electronically Signed   By: Margarette Canada M.D.   On: 07/08/2015 17:04    Medications: I have reviewed the patient's current medications. Estradiol patch  0.0375 mg  DISCUSSION  Patient understands that studies to date, which do not have very large numbers of patients (I believe total ~ 1300 patients now), do not show negative impact of HRT on breast cancer risk for patients with BRCA abnormalities, tho type and duration of HRT not standardized and recommendation to consider HRT until age 81 in younger patients includes wording "particularly in patients who elect prophylactic mastectomies". Tamoxifen in preventative fashion also consideration in setting of BRCA mutation. Patient has no significant family history of heart disease and has no known risk factors otherwise herself (tho has not had fasting lipids). She is active including weight bearing exercise. No vitamin D level in this EMR, which would also be appropriate to check.   Dr Alycia Rossetti had not recommended supplemental estrogen after oophorectomy. I have told patient that my bias is against supplemental estrogen, as I am not comfortable that the safety of this is  completely clarified.   She stopped estrogen patch x 6-7 weeks early this year, did have more hot flashes and skin "texture different" tho she felt these symptoms were tolerable; she resumed the estrogen patch in late March. She stopped patch particularly as she wondered if this was related to RUE soreness, which she felt was better off of the patch. She has now started omega 3 krill oil for arm symptoms.  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 It will be interesting to see if any improvement when she is on 5 week vacation to [place] upcoming. I do not think arm symptoms would be related to estrogen patch unless slight fluid retention related (?)     Assessment/Plan: 1.BRCA 1 positive 35 yo lady: Strong FH ovarian and breast ca in maternal relatives including recent diagnosis of metastatic breast in 36 yo maternal first cousin. Post bilateral oophorectomy in preventative attempt. Yearly mammograms and MRI. Certainly reasonable to consider prophylactic bilateral mastectomies. 2.post bilateral oophorectomy 10-22-14, with previous hysterectomy bilateral salpingectomy. Patient aware of considerations for the supplemental estrogen now. Will offer fasting lipids and Vit D level  3.gestational trophoblastic disease 2012, treated with MTX x 1 prior to hysterectomy with bilateral salpingectomy then 4 cycles actinomycin D (=2 beyond normalization of marker) thru 04-2011. She had CR, and was followed with serial BHCGs x 1 year without concerns. 4. RUE soreness etiology unclear: consider related to positioning for keyboarding with work, see if improves while away for several weeks.    I will see her back in 6 months.  All questions answered. Time spent 20 min including >50% counseling and coordination of care. Route PCP  Gordy Levan, MD   07/09/2015, 3:54 PM

## 2015-07-12 ENCOUNTER — Other Ambulatory Visit: Payer: Self-pay | Admitting: Oncology

## 2015-07-12 DIAGNOSIS — Z1501 Genetic susceptibility to malignant neoplasm of breast: Secondary | ICD-10-CM

## 2015-07-12 DIAGNOSIS — Z1509 Genetic susceptibility to other malignant neoplasm: Principal | ICD-10-CM

## 2015-07-12 DIAGNOSIS — E8940 Asymptomatic postprocedural ovarian failure: Secondary | ICD-10-CM

## 2015-10-06 ENCOUNTER — Ambulatory Visit (HOSPITAL_COMMUNITY): Payer: 59

## 2015-10-15 ENCOUNTER — Ambulatory Visit (HOSPITAL_COMMUNITY)
Admission: RE | Admit: 2015-10-15 | Discharge: 2015-10-15 | Disposition: A | Discharge status: Home / Self Care | Payer: 59 | Source: Ambulatory Visit | Attending: Oncology | Admitting: Oncology

## 2015-10-15 DIAGNOSIS — Z1509 Genetic susceptibility to other malignant neoplasm: Secondary | ICD-10-CM

## 2015-10-15 DIAGNOSIS — Z1501 Genetic susceptibility to malignant neoplasm of breast: Secondary | ICD-10-CM | POA: Diagnosis not present

## 2015-10-15 MED ORDER — GADOBENATE DIMEGLUMINE 529 MG/ML IV SOLN
20.0000 mL | Freq: Once | INTRAVENOUS | Status: AC | PRN
  Administered 2015-10-15: 17 mL via INTRAVENOUS

## 2015-10-16 ENCOUNTER — Telehealth: Payer: Self-pay

## 2015-10-16 NOTE — Telephone Encounter (Signed)
-----   Message from Gordy Levan, MD sent at 10/16/2015  2:57 PM EDT ----- Labs seen and need follow up: please let her know breast MRI fine

## 2015-10-16 NOTE — Telephone Encounter (Signed)
Told  [name] the  resukls if Breast MRI as noted below by Dr. Marko Plume.

## 2015-10-26 ENCOUNTER — Telehealth: Payer: Self-pay | Admitting: *Deleted

## 2015-10-26 NOTE — Telephone Encounter (Signed)
Patient stated that she called earlier today and talked with a triage nurse about ger family be tested for pertussis. [name] stated that her niece has been diagnosed with whooping cough recently and they were told that they should be tested since she has spent the night at their house and has spent a lot of time with her children. 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

## 2015-10-26 NOTE — Telephone Encounter (Signed)
[name], Fitzpatrick know there is a pertussis PCR test.  Do we do that here?  It's usually diagnosed based on clinical symptoms.

## 2015-10-27 NOTE — Telephone Encounter (Signed)
Spoke to pt and advised. Pt states if she begins to develop s/s she will contact office and schedule OV. As of now, only her son is symptomatic

## 2015-10-27 NOTE — Telephone Encounter (Signed)
Yes we do them through [facility], order [id number] . Nose swabs

## 2015-10-27 NOTE — Telephone Encounter (Signed)
Based on those symptoms, unlikely pertussis but we can certainly have them come in to get swabbed.

## 2015-10-27 NOTE — Telephone Encounter (Signed)
I spoke with pt and no one has any symptoms except her son does have runny nose for 3 days. Pt wants to know if needs appts or can lab test be done. Pt request cb at [phone].

## 2015-10-27 NOTE — Telephone Encounter (Signed)
PLEASE NOTE: All timestamps contained within this report are represented as [place] Standard Time. CONFIDENTIALTY NOTICE: This fax transmission is intended only for the addressee. It contains information that is legally privileged, confidential or otherwise protected from use or disclosure. If you are not the intended recipient, you are strictly prohibited from reviewing, disclosing, copying using or disseminating any of this information or taking any action in reliance on or regarding this information. If you have received this fax in error, please notify us immediately by telephone so that we can arrange for its return to [place]. Phone: [phone], Toll-Free: [phone], Fax: [phone] Page: 1 of 1 Call Id: [place] Night - Client Nonclinical Telephone Record [place] Night - Client Client Site [place] Physician Arnette Norris - MD Contact Type Call Who Is Calling Patient / Member / Family / Caregiver Caller Name [place] Phone Number [phone] Patient Name [facility] Call Type Message Only Information Provided Reason for Call Request to Schedule Office Appointment Initial Comment Caller states that she needs to set up for her family of four to get tested for Pertussis Additional Comment Call Closed By: [place] Transaction Date/Time: 10/26/2015 4:03:38 PM (ET)

## 2016-01-03 ENCOUNTER — Other Ambulatory Visit: Payer: Self-pay | Admitting: Oncology

## 2016-01-07 ENCOUNTER — Encounter: Payer: Self-pay | Admitting: Oncology

## 2016-01-07 ENCOUNTER — Other Ambulatory Visit (HOSPITAL_BASED_OUTPATIENT_CLINIC_OR_DEPARTMENT_OTHER): Payer: 59

## 2016-01-07 ENCOUNTER — Ambulatory Visit (HOSPITAL_BASED_OUTPATIENT_CLINIC_OR_DEPARTMENT_OTHER): Payer: 59 | Admitting: Oncology

## 2016-01-07 ENCOUNTER — Ambulatory Visit: Payer: 59 | Admitting: Oncology

## 2016-01-07 ENCOUNTER — Other Ambulatory Visit: Payer: 59

## 2016-01-07 VITALS — BP 104/65 | HR 58 | Temp 98.0°F | Resp 18 | Wt 192.0 lb

## 2016-01-07 DIAGNOSIS — Z1501 Genetic susceptibility to malignant neoplasm of breast: Secondary | ICD-10-CM | POA: Diagnosis not present

## 2016-01-07 DIAGNOSIS — E8940 Asymptomatic postprocedural ovarian failure: Secondary | ICD-10-CM | POA: Diagnosis not present

## 2016-01-07 DIAGNOSIS — Z1231 Encounter for screening mammogram for malignant neoplasm of breast: Secondary | ICD-10-CM

## 2016-01-07 DIAGNOSIS — Z1509 Genetic susceptibility to other malignant neoplasm: Principal | ICD-10-CM

## 2016-01-07 DIAGNOSIS — Z8041 Family history of malignant neoplasm of ovary: Secondary | ICD-10-CM

## 2016-01-07 DIAGNOSIS — Z1502 Genetic susceptibility to malignant neoplasm of ovary: Secondary | ICD-10-CM

## 2016-01-07 DIAGNOSIS — Z1239 Encounter for other screening for malignant neoplasm of breast: Secondary | ICD-10-CM

## 2016-01-07 LAB — COMPREHENSIVE METABOLIC PANEL
ALBUMIN: 3.8 g/dL (ref 3.5–5.0)
ALK PHOS: 86 U/L (ref 40–150)
ALT: 22 U/L (ref 0–55)
ANION GAP: 7 meq/L (ref 3–11)
AST: 21 U/L (ref 5–34)
BUN: 12.1 mg/dL (ref 7.0–26.0)
CALCIUM: 9.4 mg/dL (ref 8.4–10.4)
CO2: 28 mEq/L (ref 22–29)
CREATININE: 0.8 mg/dL (ref 0.6–1.1)
Chloride: 106 mEq/L (ref 98–109)
EGFR: >90 mL/min/{1.73_m2} (ref >90)
Glucose: 98 mg/dl (ref 70–140)
POTASSIUM: 3.9 meq/L (ref 3.5–5.1)
Sodium: 142 mEq/L (ref 136–145)
Total Bilirubin: 0.39 mg/dL (ref 0.20–1.20)
Total Protein: 7.3 g/dL (ref 6.4–8.3)

## 2016-01-07 LAB — CBC WITH DIFFERENTIAL/PLATELET
BASO%: 0.6 % (ref 0.0–2.0)
BASOS ABS: 0 10*3/uL (ref 0.0–0.1)
EOS ABS: 0.1 10*3/uL (ref 0.0–0.5)
EOS%: 1.5 % (ref 0.0–7.0)
HEMATOCRIT: 38.2 % (ref 34.8–46.6)
HEMOGLOBIN: 12.9 g/dL (ref 11.6–15.9)
LYMPH#: 1.3 10*3/uL (ref 0.9–3.3)
LYMPH%: 28.1 % (ref 14.0–49.7)
MCH: 29.8 pg (ref 25.1–34.0)
MCHC: 33.8 g/dL (ref 31.5–36.0)
MCV: 88.2 fL (ref 79.5–101.0)
MONO#: 0.3 10*3/uL (ref 0.1–0.9)
MONO%: 6.7 % (ref 0.0–14.0)
NEUT#: 3 10*3/uL (ref 1.5–6.5)
NEUT%: 63.1 % (ref 38.4–76.8)
PLATELETS: 213 10*3/uL (ref 145–400)
RBC: 4.33 10*6/uL (ref 3.70–5.45)
RDW: 12.6 % (ref 11.2–14.5)
WBC: 4.8 10*3/uL (ref 3.9–10.3)

## 2016-01-07 LAB — LIPID PANEL
CHOLESTEROL TOTAL: 178 mg/dL (ref 100–199)
Chol/HDL Ratio: 3 ratio units (ref 0.0–4.4)
HDL: 60 mg/dL (ref >39)
LDL Calculated: 96 mg/dL (ref 0–99)
TRIGLYCERIDES: 109 mg/dL (ref 0–149)
VLDL CHOLESTEROL CAL: 22 mg/dL (ref 5–40)

## 2016-01-07 NOTE — Progress Notes (Signed)
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 She had hysterectomy with bilateral salpingectomy for the GTD, then oophorectomies 10-2014 after BRCA diagnosis. She had bilateral mammograms at breast center 07-08-15 and MR breast 10-15-15, no findings of concern.   Patient has been doing well, with no concerns that seem related to the BRCA mutation, including no changes in breasts and no abdominal or pelvic symptoms. She discontinued estrogen a month ago, that prescribed by gyn oncology due to surgical menopause, however patient has been uncomfortable with risk/ benefit of estrogen supplementation. She has more hot flashes, not too bothersome. She is active at work Investment banker, corporate at [employer]) and walks dog regularly, but also plans to [facility] [employer] with friend who already goes regularly. Appetite and energy are good, no respiratory or GI symptoms, no recent infectious illness, no bleeding. Some UE discomfort resolved while she was on vacation away from computer work. Remainder of 14 point Review of Systems negative.    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 Per Dr.Gehrig's subsequent consult note, WHO score was 7 at diagnosis. B HCGs were followed closely but did not normalize and she was begun on methotrexate in August 2012, with CT head+ CAP. The marker progressively dropped until mid October, at which time treatment was changed to bolus actinomycinD at 1.25 mg/m2, single treatment given at [facility] on 11-22-10. Repeat CT Head +CAP was done at  [facility]11-2-12 with stable 3.2 cm simple cyst in lateral left hepatic lobe and stable small low density lesion in central liver, with enlarging hypervascular mass in endometrial canal involving right wall of uterus, no evidence of metastatic disease. 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 She was given one pre-op dose of MTX day of surgery. B HCG dropped from 5919 to 6.4 by 02-16-11, then plateau'd (serial dilutions done and confirmed HCG). She had CT CAP on 03-15-11, with chest unremarkable, stable left hepatic cyst, no pelvic adenopathy, ovaries not remarkable with corpus luteal cyst, no CT evidence of metastatic disease. She was begun on actinomycin D 12 mcg/kg/day x5 q 2 weeks on 03-13-2011, with BHCG 11.9 on 03-14-11 and 21.1 on 03-21-11, but subsequently down to normal range (2.3) by 04-11-11. Cycle 4 (day 1 on 04-25-11) was the second full cycle beyond normalization of marker.  After gestational trophoblastic disease was resolved, patient's mother developed ovarian carcinosarcoma and was found to be BRCA 1 positive; Leida then found also BRCA 1 positive. She had bilateral oophorectomy 10-22-14, with benign ovaries on path.   Objective:  Vital signs in last 24 hours:  104/65, 18 not labored, 98, 98%, 192 lbs (up 2 lbs) LMP 05/31/2010   Alert, oriented and appropriate, looks comfortable and well. Ambulatory without difficulty. Very pleasant as always  HEENT:PERRL, sclerae not icteric. Oral mucosa moist without lesions, posterior pharynx clear.  Neck supple. No JVD.  Lymphatics:no cervical,supraclavicular, axillary or inguinal adenopathy Resp: clear to auscultation bilaterally and normal percussion bilaterally Cardio:  regular rate and rhythm. No gallop. GI: soft, nontender, not distended, no mass or organomegaly. Normally active bowel  sounds. Surgical incision not remarkable. Musculoskeletal/ Extremities: UE /LE without pitting edema, cords, tenderness Neuro: nonfocal   PSYCH appropriate mood and affect Skin without rash, ecchymosis, petechiae Breasts: bilaterally without dominant mass, skin or nipple findings. Axillae benign.   Lab Results:  Results for orders placed or performed in visit on 01/07/16  CBC with Differential  Result Value Ref Range   WBC 4.8 3.9 - 10.3 10e3/uL   NEUT# 3.0 1.5 - 6.5 10e3/uL   HGB 12.9 11.6 - 15.9 g/dL   HCT 38.2 34.8 - 46.6 %   Platelets 213 145 - 400 10e3/uL   MCV 88.2 79.5 - 101.0 fL   MCH 29.8 25.1 - 34.0 pg   MCHC 33.8 31.5 - 36.0 g/dL   RBC 4.33 3.70 - 5.45 10e6/uL   RDW 12.6 11.2 - 14.5 %   lymph# 1.3 0.9 - 3.3 10e3/uL   MONO# 0.3 0.1 - 0.9 10e3/uL   Eosinophils Absolute 0.1 0.0 - 0.5 10e3/uL   Basophils Absolute 0.0 0.0 - 0.1 10e3/uL   NEUT% 63.1 38.4 - 76.8 %   LYMPH% 28.1 14.0 - 49.7 %   MONO% 6.7 0.0 - 14.0 %   EOS% 1.5 0.0 - 7.0 %   BASO% 0.6 0.0 - 2.0 %    CMET entirely normal  Lipid panel with total cholesterol 178, trig 109, HDL 60, VLDL 22, LDL 96, chol/ HDL 3  Vit D resulted after visit 30  Studies/Results:  No results found.  Medications: I have reviewed the patient's current medications. Will recommend ~ 800 units Vit D daily  DISCUSSION  Decision to try off estrogen discussed, encouraged very good weight bearing exercise and will let her know Vit D as above.  She understands risk of breast cancer in BRCA setting is decreased markedly by prophylactic mastectomies, which she feels she may want to do "when I am 44". Until mastectomies, I have recommended continuing yearly mammograms with breast MRI within 3 months after mammograms. She will be due mammograms again in June. She would be glad to have some follow up with Dr Alycia Rossetti, appointment with her requested ~ June.  After considering [facility] clinic vs medical oncologist follow up, I have  requested appointment with Dr Burr Medico for ~ Aug 2018, as that would give time for MRI to be set up from mammograms.     Assessment/Plan: 1.BRCA 1 positive 35 yo lady: Strong FH ovarian and breast ca in maternal relatives including recent diagnosis of metastatic breast in 36 yo maternal first cousin. Much of family is in [place] and few are interested in genetics testing as yet. Post bilateral oophorectomy in preventative attempt. Yearly mammograms and MRI. Certainly reasonable to consider prophylactic bilateral mastectomies. 2.post bilateral oophorectomy 10-22-14, with previous hysterectomy bilateral salpingectomy. Patient aware of considerations for the supplemental estrogen, is trying off of this for now. Fasting lipids and Vit D information as above.  3.gestational trophoblastic disease 2012, treated with MTX x 1 prior to hysterectomy with bilateral salpingectomy then 4 cycles actinomycin D (=2 beyond normalization of marker) thru 04-2011. She had CR, and was followed with serial BHCGs x 1 year without concerns.   All questions answered and she knows to call if needed prior to next scheduled visit. Time spent 20 min including >50% counseling and coordination of care.  Route PCP    Evlyn Clines, MD   01/07/2016, 9:34 AM

## 2016-01-08 LAB — VITAMIN D 25 HYDROXY (VIT D DEFICIENCY, FRACTURES): VIT D 25 HYDROXY: 30.2 ng/mL (ref 30.0–100.0)

## 2016-01-12 ENCOUNTER — Telehealth: Payer: Self-pay

## 2016-01-12 NOTE — Telephone Encounter (Signed)
-----   Message from Gordy Levan, MD sent at 01/08/2016 12:33 PM EST ----- Labs seen and need follow up: please let her know chemistries and lipids good as above. Vit D is lower normal. I would like her to take D3 800 - 1000 units daily

## 2016-01-12 NOTE — Telephone Encounter (Signed)
LM in [name]'s voice mail regarding the results below  With recommendation for oral vitamin D3. [name] can call Dr. Mariana Kaufman nurse if she has any question or concerns regarding this message.

## 2016-02-05 ENCOUNTER — Other Ambulatory Visit: Payer: Self-pay | Admitting: Nurse Practitioner

## 2016-03-02 ENCOUNTER — Other Ambulatory Visit: Payer: Self-pay | Admitting: Oncology

## 2016-03-02 DIAGNOSIS — Z1501 Genetic susceptibility to malignant neoplasm of breast: Secondary | ICD-10-CM

## 2016-03-02 DIAGNOSIS — E8940 Asymptomatic postprocedural ovarian failure: Secondary | ICD-10-CM | POA: Insufficient documentation

## 2016-03-02 DIAGNOSIS — Z1509 Genetic susceptibility to other malignant neoplasm: Principal | ICD-10-CM

## 2016-03-02 DIAGNOSIS — Z1239 Encounter for other screening for malignant neoplasm of breast: Secondary | ICD-10-CM | POA: Insufficient documentation

## 2016-07-12 ENCOUNTER — Other Ambulatory Visit: Payer: Self-pay | Admitting: Gynecologic Oncology

## 2016-07-27 ENCOUNTER — Ambulatory Visit: Payer: 59 | Admitting: Gynecologic Oncology

## 2016-07-28 ENCOUNTER — Other Ambulatory Visit: Payer: Self-pay | Admitting: Family Medicine

## 2016-07-28 ENCOUNTER — Other Ambulatory Visit: Payer: Self-pay | Admitting: Gynecologic Oncology

## 2016-07-28 ENCOUNTER — Other Ambulatory Visit: Payer: Self-pay | Admitting: Hematology

## 2016-07-28 DIAGNOSIS — Z789 Other specified health status: Secondary | ICD-10-CM

## 2016-08-02 ENCOUNTER — Ambulatory Visit
Admission: RE | Admit: 2016-08-02 | Discharge: 2016-08-02 | Disposition: A | Discharge status: Home / Self Care | Payer: 59 | Source: Ambulatory Visit | Attending: Gynecologic Oncology | Admitting: Gynecologic Oncology

## 2016-08-02 DIAGNOSIS — Z1231 Encounter for screening mammogram for malignant neoplasm of breast: Secondary | ICD-10-CM | POA: Diagnosis not present

## 2016-08-02 DIAGNOSIS — Z789 Other specified health status: Secondary | ICD-10-CM

## 2016-08-24 ENCOUNTER — Other Ambulatory Visit: Payer: Self-pay | Admitting: Gynecologic Oncology

## 2016-08-24 ENCOUNTER — Encounter: Payer: Self-pay | Admitting: Gynecologic Oncology

## 2016-08-24 ENCOUNTER — Ambulatory Visit: Payer: 59 | Attending: Gynecologic Oncology | Admitting: Gynecologic Oncology

## 2016-08-24 ENCOUNTER — Other Ambulatory Visit (HOSPITAL_BASED_OUTPATIENT_CLINIC_OR_DEPARTMENT_OTHER): Payer: 59

## 2016-08-24 VITALS — BP 113/55 | HR 59 | Temp 98.9°F | Resp 18 | Ht 67.0 in | Wt 194.9 lb

## 2016-08-24 DIAGNOSIS — Z90710 Acquired absence of both cervix and uterus: Secondary | ICD-10-CM | POA: Insufficient documentation

## 2016-08-24 DIAGNOSIS — Z1501 Genetic susceptibility to malignant neoplasm of breast: Secondary | ICD-10-CM | POA: Diagnosis not present

## 2016-08-24 DIAGNOSIS — N951 Menopausal and female climacteric states: Secondary | ICD-10-CM

## 2016-08-24 DIAGNOSIS — Z1502 Genetic susceptibility to malignant neoplasm of ovary: Secondary | ICD-10-CM | POA: Diagnosis not present

## 2016-08-24 DIAGNOSIS — Z1509 Genetic susceptibility to other malignant neoplasm: Principal | ICD-10-CM

## 2016-08-24 DIAGNOSIS — N958 Other specified menopausal and perimenopausal disorders: Secondary | ICD-10-CM

## 2016-08-24 DIAGNOSIS — Z90722 Acquired absence of ovaries, bilateral: Secondary | ICD-10-CM | POA: Diagnosis not present

## 2016-08-24 DIAGNOSIS — O019 Hydatidiform mole, unspecified: Secondary | ICD-10-CM | POA: Diagnosis not present

## 2016-08-24 DIAGNOSIS — Z8041 Family history of malignant neoplasm of ovary: Secondary | ICD-10-CM | POA: Insufficient documentation

## 2016-08-24 MED ORDER — ESTRADIOL 0.0375 MG/24HR TD PTWK: 0.0375 mg | MEDICATED_PATCH | TRANSDERMAL | 4 refills | Status: DC

## 2016-08-24 MED FILL — ESTRADIOL TDS 0.0375 MG/DAY: 0.0375 | 84 days supply | Qty: 12 | Fill #0 | Status: TO

## 2016-08-24 NOTE — Progress Notes (Signed)
POSTOPERATIVE FOLLOWUP  Assessment:    36 y.o. year old with a deleterious mutation of BRCA1.   S/p laparoscopic bilateral oohoporectomy on 10/22/14. She is also status post prior hysterectomy secondary to persistent gestational trophoblastic disease. We will follow-up in results of her CA-125 from today. She wanted to restart her Climara in a prescription for Climara 0.0375 weekly was sent to her pharmacy. She'll return to see me in one year  Plan:    HPI:  [name] is a 36 y.o. year old [id number] initially seen in consultation on October 2012 for a deleterious mutation in [place].  She then underwent a laparascopic bilateral oophorectomy on 8/32/91 without complications (she had previously had a hysterectomy and salpingectomy).  Her postoperative course was uncomplicted.  Her final pathology revealed no malignancy.  She was last seen by [place] in 2060 when she had her postoperative check. She was last seen by Dr. Marko Plume in December 2017. She is up-to-date on her sequential MRIs and mammograms of her breast. She did take short-term hormone replacement therapy until the fall of 2017 but subsequently discontinued. She comes in today for follow-up. We discussed the need for annual physical examinations and yearly CA-125 that she is BRCA1 positive.  Doing fairly well. Her maternal aunt to 52 was recently diagnosed with advanced ovarian cancer in [place]. She was diagnosed in April. She states that her family is now taken a diagnosis a little bit more seriously. She has an aunt with an [place] who was tested and she is BRCA negative. The patient stopped her hormone replacement therapy about 7 months ago. Recently she's having some issues with her memory libido and she is worried about bone and cardiovascular health she would like to restart it. She is up-to-date on her mammograms and MRIs. She has an upcoming appointment with Dr. Morey Hummingbird to establish a new patient visit. She is otherwise doing fairly well.  Her son is given via freshman in high school and her daughter's convenience seventh grade.  Her 10 point review of systems is negative.  Physical Exam: Blood pressure (!) 113/55, pulse (!) 59, temperature 98.9 F (37.2 C), temperature source Oral, resp. rate 18, height 5' 7"  (1.702 m), weight 194 lb 14.4 oz (88.4 kg), last menstrual period 05/31/2010, SpO2 99 %. General: Well dressed, well nourished in no apparent distress.   HEENT:  Thyroid is normal size, not nodular, midline. Skin:  No lesions or rashes. Abdomen:  Soft, nontender, nondistended.  No palpable masses.  No hepatosplenomegaly.  No ascites. Normal bowel sounds.  No hernias.  Incisions are well healed Genitourinary: External genitalia within normal limits. Bimanual examination was no masses or nodularity.  Extremities: No edema.   Psychiatric: Mood and affect are appropriate. Neurological: Awake, alert and oriented x 3.  Musculoskeletal: No pain, normal strength and range of motion.  Nancy Marus A., MD

## 2016-08-24 NOTE — Patient Instructions (Signed)
Plan to follow up in one year or sooner if needed.  We will let you know the results of your CA 125.

## 2016-08-25 LAB — CA 125: CANCER ANTIGEN (CA) 125: 11.6 U/mL (ref 0.0–38.1)

## 2016-09-01 ENCOUNTER — Telehealth: Payer: Self-pay | Admitting: Hematology

## 2016-09-01 NOTE — Telephone Encounter (Signed)
sw pt to confirm r/s appt to 8/22 at 330 per voicemail

## 2016-09-08 ENCOUNTER — Ambulatory Visit: Payer: 59 | Admitting: Hematology

## 2016-09-21 ENCOUNTER — Ambulatory Visit: Payer: 59 | Admitting: Hematology

## 2016-09-22 ENCOUNTER — Encounter: Payer: Self-pay | Admitting: Hematology

## 2016-09-22 ENCOUNTER — Ambulatory Visit (HOSPITAL_BASED_OUTPATIENT_CLINIC_OR_DEPARTMENT_OTHER): Payer: 59 | Admitting: Hematology

## 2016-09-22 ENCOUNTER — Encounter (HOSPITAL_COMMUNITY): Payer: Self-pay | Admitting: Emergency Medicine

## 2016-09-22 ENCOUNTER — Ambulatory Visit (HOSPITAL_COMMUNITY)
Admission: EM | Admit: 2016-09-22 | Discharge: 2016-09-22 | Disposition: A | Discharge status: Home / Self Care | Payer: 59 | Attending: Emergency Medicine | Admitting: Emergency Medicine

## 2016-09-22 VITALS — BP 104/62 | HR 64 | Temp 98.5°F | Resp 18 | Ht 67.0 in | Wt 196.2 lb

## 2016-09-22 DIAGNOSIS — Z1501 Genetic susceptibility to malignant neoplasm of breast: Secondary | ICD-10-CM

## 2016-09-22 DIAGNOSIS — Z1509 Genetic susceptibility to other malignant neoplasm: Secondary | ICD-10-CM

## 2016-09-22 DIAGNOSIS — Z8759 Personal history of other complications of pregnancy, childbirth and the puerperium: Secondary | ICD-10-CM

## 2016-09-22 DIAGNOSIS — W540XXA Bitten by dog, initial encounter: Secondary | ICD-10-CM | POA: Diagnosis not present

## 2016-09-22 DIAGNOSIS — S51852A Open bite of left forearm, initial encounter: Secondary | ICD-10-CM | POA: Diagnosis not present

## 2016-09-22 MED ORDER — IBUPROFEN 800 MG PO TABS: 800.0000 mg | ORAL_TABLET | Freq: Three times a day (TID) | ORAL | 0 refills | Status: DC

## 2016-09-22 MED ORDER — AMOXICILLIN-POT CLAVULANATE 875-125 MG PO TABS: 1.0000 | ORAL_TABLET | Freq: Two times a day (BID) | ORAL | 0 refills | Status: DC

## 2016-09-22 NOTE — ED Triage Notes (Signed)
PT took her dog to the vet today and was restraining her dog for a procedure. PT's dog bit her left forearm. Dog is up to date on rabies vaccination, this was confirmed by vet.

## 2016-09-22 NOTE — Discharge Instructions (Signed)
Take motrin consistently for next 5 days and take tylenol otc as directed with motrin for pain.

## 2016-09-22 NOTE — ED Provider Notes (Signed)
[facility]   [id number] 09/22/16 Arrival Time: [place]:  1. Dog bite, initial encounter     Meds ordered this encounter  Medications  . ibuprofen (ADVIL,MOTRIN) 800 MG tablet    Sig: Take 1 tablet (800 mg total) by mouth 3 (three) times daily.    Dispense:  21 tablet    Refill:  0    Order Specific Question:   Supervising Provider    Answer:   Melynda Ripple [[id number]]  . amoxicillin-clavulanate (AUGMENTIN) 875-125 MG tablet    Sig: Take 1 tablet by mouth 2 (two) times daily.    Dispense:  20 tablet    Refill:  0    Order Specific Question:   Supervising Provider    Answer:   Melynda Ripple [[id number]]    Reviewed expectations re: course of current medical issues. Questions answered. Outlined signs and symptoms indicating need for more acute intervention. Patient verbalized understanding. After Visit Summary given.   SUBJECTIVE:  [name] is a 36 y.o. female who presents with complaint of her pet Dondra Spry who is up to date with rabies vaccination bit her on her left forearm when at vet getting a procedure.  ROS: As per HPI.   OBJECTIVE:  Vitals:   09/22/16 1951 09/22/16 1952  BP:  113/66  Pulse:  62  Resp:  16  Temp:  98.3 F (36.8 C)  TempSrc:  Oral  SpO2:  100%  Weight: 194 lb (88 kg)   Height: _0  (1.702 m)      General appearance: alert; no distress Eyes: PERRLA; EOMI; conjunctiva normal HENT: normocephalic; atraumatic; Lungs: clear to auscultation bilaterally Heart: regular rate and rhythm Extremities: Left wrist with abrasion and puncture.  Swelling left wrist and bruising.  Neurovascular intact.  FROM left wristt Neurologic: normal gait; normal symmetric reflexes Psychological: alert and cooperative; normal mood and affect  Past Medical History:  Diagnosis Date  . BRCA1 positive   . Cancer [facility])    hydatidiform mole  . Family history of ovarian cancer   . Gestational trophoblastic tumor, invasive mole  11/28/2010  . Sore throat    recent 10/17/2014 to see PCP on 10/17/14      has a past medical history of BRCA1 positive; Cancer ([place]); Family history of ovarian cancer; Gestational trophoblastic tumor, invasive mole (11/28/2010); and Sore throat.  Results for orders placed or performed in visit on 08/24/16  CA 125  Result Value Ref Range   Cancer Antigen (CA) 125 11.6 0.0 - 38.1 U/mL    Labs Reviewed - No data to display  Imaging: No results found.  No Known Allergies  Family History  Problem Relation Age of Onset  . Ovarian cancer Mother 61  . BRCA 1/2 Mother        BRCA1 mutation  . Ovarian cancer Maternal Aunt 33  . Diabetes Other   . Diabetes Other   . Coronary artery disease Other   . Breast cancer Cousin 37  . Cancer Other        "abdominal ca" in 3 sisters of mat grandfather   Past Surgical History:  Procedure Laterality Date  . ABDOMINAL HYSTERECTOMY  12/17/2010  . CESAREAN SECTION  2004, 2006  . DILATION AND CURETTAGE OF UTERUS    . DILATION AND EVACUATION  08/12/2010   Procedure: DILATATION AND EVACUATION (D&E);  Surgeon: Melina Schools, MD;  Location: [facility];  Service: Gynecology;  Laterality: N/A;  . LAPAROSCOPIC OOPHERECTOMY Bilateral  10/22/2014   Procedure: BILATERAL LAPAROSCOPIC OOPHERECTOMY;  Surgeon: Nancy Marus, MD;  Location: [facility];  Service: Gynecology;  Laterality: Bilateral;         Lysbeth Penner, [place] 09/22/16 2018

## 2016-09-22 NOTE — Progress Notes (Signed)
[place]  [phone] [phone]  Clinic Follow up Note   Patient Care Team: Lucille Passy, MD as PCP - General (Family Medicine) 09/22/2016  Chief Complaint: Follow up BCRA 1 mutation  SUMMARY OF ONCOLOGIC HISTORY (per Dr. 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 Per Dr.Gehrig's subsequent consult note, WHO score was 7 at diagnosis. B HCGs were followed closely but did not normalize and she was begun on methotrexate in August 2012, with CT head+ CAP. The marker progressively dropped until mid October, at which time treatment was changed to bolus actinomycinD at 1.25 mg/m2, single treatment given at [facility] on 11-22-10. Repeat CT Head +CAP was done at [facility]11-2-12 with stable 3.2 cm simple cyst in lateral left hepatic lobe and stable small low density lesion in central liver, with enlarging hypervascular mass in endometrial canal involving right wall of uterus, no evidence of metastatic disease. 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 She was given one pre-op dose of MTX day of surgery. B HCG dropped from 5919 to 6.4 by 02-16-11, then plateau'd (serial dilutions done and confirmed HCG). She had CT CAP on 03-15-11, with chest unremarkable, stable left hepatic cyst, no pelvic adenopathy, ovaries not remarkable with corpus luteal cyst, no CT evidence of metastatic disease. She was begun on actinomycin D 12 mcg/kg/day x5 q 2 weeks on 03-13-2011, with BHCG 11.9 on 03-14-11 and 21.1 on 03-21-11, but subsequently down to normal range (2.3) by 04-11-11. Cycle 4 (day 1 on 04-25-11) was the second full cycle beyond normalization of marker.  After gestational trophoblastic disease was resolved, patient's mother  developed ovarian carcinosarcoma and was found to be BRCA 1 positive; Hiliary then found also BRCA 1 positive. She had bilateral oophorectomy 10-22-14, with benign ovaries on path.   INTERVAL HISTORY: Patient previously under Dr. Mariana Kaufman care, who has retired. She is here today for follow up. She has not had a BRCA related malignancy but did have gestational trophoblastic disease; which she had cured in 2012. She underwent a hysterectomy with bilateral- salpingectomy for the GTD followed by oophorectomies in 10/2014. She received 2 months of PICC-line chemotherapy due to still having molar pregnancy symptoms after hysterectomy.    She is doing very well, denies any complains. On 07/08/2015, she underwent bilateral mammograms and 10/15/2015 breast MR showed no signs of concern.Her last mammogram was normal on 08/02/2016.    REVIEW OF SYSTEMS:   Constitutional: Denies fevers, chills or abnormal weight loss Eyes: Denies blurriness of vision Ears, nose, mouth, throat, and face: Denies mucositis or sore throat Respiratory: Denies cough, dyspnea or wheezes Cardiovascular: Denies palpitation, chest discomfort or lower extremity swelling Gastrointestinal:  Denies nausea, heartburn or change in bowel habits Skin: Denies abnormal skin rashes Lymphatics: Denies new lymphadenopathy or easy bruising Neurological:Denies numbness, tingling or new weaknesses Behavioral/Psych: Mood is stable, no new changes  All other systems were reviewed with the patient and are negative.  MEDICAL HISTORY:  Past Medical History:  Diagnosis Date  . BRCA1 positive   . Cancer [facility])    hydatidiform mole  . Family history of ovarian cancer   . 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 Sore throat    recent 10/17/2014 to see PCP on 10/17/14     SURGICAL HISTORY: Past  Surgical History:  Procedure Laterality Date  . ABDOMINAL HYSTERECTOMY  12/17/2010  . CESAREAN SECTION  2004, 2006  . DILATION AND  CURETTAGE OF UTERUS    . DILATION AND EVACUATION  08/12/2010   Procedure: DILATATION AND EVACUATION (D&E);  Surgeon: Melina Schools, MD;  Location: [facility];  Service: Gynecology;  Laterality: N/A;  . LAPAROSCOPIC OOPHERECTOMY Bilateral 10/22/2014   Procedure: BILATERAL LAPAROSCOPIC OOPHERECTOMY;  Surgeon: Nancy Marus, MD;  Location: [facility];  Service: Gynecology;  Laterality: Bilateral;    I have reviewed the social history and family history with the patient and they are unchanged from previous note.  ALLERGIES:  has No Known Allergies.  MEDICATIONS:  Current Outpatient Prescriptions  Medication Sig Dispense Refill  . Cholecalciferol (VITAMIN D) 2000 units tablet Take 2,000 Units by mouth daily.    Marland Kitchen estradiol (CLIMARA - DOSED IN MG/24 HR) 0.0375 mg/24hr patch Place 1 patch (0.0375 mg total) onto the skin once a week. 12 patch 4  . ibuprofen (ADVIL,MOTRIN) 200 MG tablet Take 400 mg by mouth daily as needed. For pain.     . Multiple Vitamins-Minerals (MULTIVITAMIN PO) Take 1 tablet by mouth daily.    Marland Kitchen amoxicillin-clavulanate (AUGMENTIN) 875-125 MG tablet Take 1 tablet by mouth 2 (two) times daily. 20 tablet 0  . ibuprofen (ADVIL,MOTRIN) 800 MG tablet Take 1 tablet (800 mg total) by mouth 3 (three) times daily. 21 tablet 0   No current facility-administered medications for this visit.     PHYSICAL EXAMINATION:  ECOG PERFORMANCE STATUS: 0 - Asymptomatic  Vitals:   09/22/16 0856  BP: 104/62  Pulse: 64  Resp: 18  Temp: 98.5 F (36.9 C)  SpO2: 97%   Filed Weights   09/22/16 0856  Weight: 196 lb 3.2 oz (89 kg)    GENERAL:alert, no distress and comfortable SKIN: skin color, texture, turgor are normal, no rashes or significant lesions EYES: normal, Conjunctiva are pink and non-injected, sclera clear OROPHARYNX:no exudate, no erythema and lips, buccal mucosa, and tongue normal  NECK: supple, thyroid normal size, non-tender, without nodularity LYMPH:  no palpable lymphadenopathy in  the cervical, axillary or inguinal LUNGS: clear to auscultation and percussion with normal breathing effort HEART: regular rate & rhythm and no murmurs and no lower extremity edema ABDOMEN:abdomen soft, non-tender and normal bowel sounds Musculoskeletal:no cyanosis of digits and no clubbing  NEURO: alert & oriented x 3 with fluent speech, no focal motor/sensory deficits Breasts: Breast inspection showed them to be symmetrical with no nipple discharge. Palpation of the breasts and axilla revealed no obvious mass that I could appreciate.  LABORATORY DATA:  I have reviewed the data as listed CBC Latest Ref Rng & Units 01/07/2016 11/17/2014 10/17/2014  WBC 3.9 - 10.3 10e3/uL 4.8 5.4 6.9  Hemoglobin 11.6 - 15.9 g/dL 12.9 13.0 12.9  Hematocrit 34.8 - 46.6 % 38.2 38.4 37.7  Platelets 145 - 400 10e3/uL 213 184 224     CMP Latest Ref Rng & Units 01/07/2016 11/17/2014 10/17/2014  Glucose 70 - 140 mg/dl 98 104 107(H)  BUN 7.0 - 26.0 mg/dL 12.1 14.7 9  Creatinine 0.6 - 1.1 mg/dL 0.8 0.8 0.60  Sodium 136 - 145 mEq/L 142 141 139  Potassium 3.5 - 5.1 mEq/L 3.9 4.3 4.2  Chloride 101 - 111 mmol/L - - 105  CO2 22 - 29 mEq/L 28 27 26   Calcium 8.4 - 10.4 mg/dL 9.4 9.6 9.6  Total Protein 6.4 - 8.3 g/dL 7.3 7.5 -  Total Bilirubin 0.20 -  1.20 mg/dL 0.39 0.48 -  Alkaline Phos 40 - 150 U/L 86 69 -  AST 5 - 34 U/L 21 24 -  ALT 0 - 55 U/L 22 26 -   PATHOLOGY: Diagnosis 10/22/2014 1. Ovary, right - BENIGN OVARY - NO TUMOR SEEN. 2. Ovary, left - BENIGN OVARY - NO TUMOR SEEN.  RADIOGRAPHIC STUDIES: Mammogram 08/02/2016 IMPRESSION: No mammographic evidence of malignancy.  MR Breast Bilateral w wo contrast 10/15/2015 IMPRESSION: No MR evidence of breast malignancy.   I have personally reviewed the radiological images as listed and agreed with the findings in the report. No results found.   ASSESSMENT & PLAN:  36 y.o. pre-menopausal woman with   1. 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 -due to the high risk of breast and ovary cancer, I recommend prophylactic bilateral mastectomy and salpingo-oophorectomies, which can reduce her risk of breast cancer and overlying cancer by more than 95% (but not 100%).  -She has had prophylactic BSO -due to her young age and strong family history of breast cancer, I strongly encourage her to consider prophylactic simple mastectomy, with or without reconstruction. We reviewed the different options of reconstruction. I recommend her to meet breast surgeon and plastic surgeon to help her to make decision.  -Pt is a nurse at [employer], and concerns about her privacy if she has surgery at [facility]. She is thinking about having surgery at [facility], but she is concerned about her insurance coverage. -Before she has bilateral mastectomy, I recommend her to continue screening mammogram and breast MRI once a year to detect early stage breast cancer, ideally 6 months apart between mammogram and MRI. -We also discussed chemoprevention with use of tamoxifen 20 mg daily to help prevent breast cancer risk. The side effects of tamoxifen, which includes but done not limited to, hot flashes, vaginal dryness, small risk of thrombosis, small risk of endometrial cancer (does not apply to her due to hysterectomy), were discussed with her in great details.  -I will see her back once a year, until she has b/l mastectomy  2. History of molar pregnancy -NED now  -she is s/p hyesrectomy and BSO    PLAN - MRI w and wo contrast in Jan 2019 -3-D screening mammogram in 07/2017 - she will think about when and where she wants to have prophylactic bilateral mastectomy and reconstruction - schedule for annual follow up, no lab    No problem-specific Assessment & Plan notes found for this  encounter.   Orders Placed This Encounter  Procedures  . MR BREAST BILATERAL W WO CONTRAST    Standing Status:   Future    Standing Expiration Date:   11/22/2017    Order Specific Question:   If indicated for the ordered procedure, I authorize the administration of contrast media per Radiology protocol    Answer:   Yes    Order Specific Question:   What is the patient's sedation requirement?    Answer:   No Sedation    Order Specific Question:   Does the patient have a pacemaker or implanted devices?    Answer:   No    Order Specific Question:   Radiology Contrast Protocol - do NOT remove file path    Answer:   [email or web address]    Order Specific Question:  Preferred imaging location?    Answer:   [facility] (table limit-550lbs)  . MM Digital Screening    Standing Status:   Future    Standing Expiration Date:   09/22/2017    Order Specific Question:   Reason for Exam (SYMPTOM  OR DIAGNOSIS REQUIRED)    Answer:   screening    Order Specific Question:   Is the patient pregnant?    Answer:   No    Order Specific Question:   Preferred imaging location?    Answer:   [facility]   All questions were answered. The patient knows to call the clinic with any problems, questions or concerns. No barriers to learning was detected.  I spent 25 minutes counseling the patient face to face. The total time spent in the appointment was 30 minutes and more than 50% was on counseling and review of test results   This document serves as a record of services personally performed by Truitt Merle, MD. It was created on her behalf by Brandt Loosen, a trained medical scribe. The creation of this record is based on the scribe's personal observations and the provider's statements to them. This document has been checked and approved by the attending provider.   Truitt Merle, MD 09/22/2016

## 2016-09-26 ENCOUNTER — Telehealth: Payer: Self-pay | Admitting: Hematology

## 2016-09-26 NOTE — Telephone Encounter (Signed)
Scheduled appt per 8/23 los - f/u in one year- sent reminder letter in the mail.

## 2016-10-05 ENCOUNTER — Encounter: Payer: Self-pay | Admitting: Hematology

## 2016-10-07 ENCOUNTER — Other Ambulatory Visit: Payer: Self-pay | Admitting: *Deleted

## 2016-10-07 DIAGNOSIS — Z1509 Genetic susceptibility to other malignant neoplasm: Secondary | ICD-10-CM

## 2016-10-07 DIAGNOSIS — Z1239 Encounter for other screening for malignant neoplasm of breast: Secondary | ICD-10-CM

## 2016-10-07 DIAGNOSIS — Z1501 Genetic susceptibility to malignant neoplasm of breast: Secondary | ICD-10-CM

## 2016-10-24 ENCOUNTER — Telehealth: Payer: Self-pay | Admitting: Hematology

## 2016-10-24 NOTE — Telephone Encounter (Signed)
Faxed records to Dr [facility] office. Dr Abelino Derrick will review records and if pt is excepted his office will call pt with appt.  Dr. Stevphen Rochester is not excepting new pts at this time.

## 2016-11-22 ENCOUNTER — Encounter: Payer: 59 | Admitting: Family Medicine

## 2016-11-22 DIAGNOSIS — Z0289 Encounter for other administrative examinations: Secondary | ICD-10-CM

## 2016-11-30 DIAGNOSIS — H5213 Myopia, bilateral: Secondary | ICD-10-CM | POA: Diagnosis not present

## 2016-12-09 DIAGNOSIS — Z1379 Encounter for other screening for genetic and chromosomal anomalies: Secondary | ICD-10-CM | POA: Diagnosis not present

## 2016-12-09 DIAGNOSIS — Z803 Family history of malignant neoplasm of breast: Secondary | ICD-10-CM | POA: Diagnosis not present

## 2016-12-09 DIAGNOSIS — Z1231 Encounter for screening mammogram for malignant neoplasm of breast: Secondary | ICD-10-CM | POA: Diagnosis not present

## 2016-12-09 DIAGNOSIS — Z1501 Genetic susceptibility to malignant neoplasm of breast: Secondary | ICD-10-CM | POA: Diagnosis not present

## 2016-12-09 DIAGNOSIS — Z1509 Genetic susceptibility to other malignant neoplasm: Secondary | ICD-10-CM | POA: Diagnosis not present

## 2017-01-18 DIAGNOSIS — Z1509 Genetic susceptibility to other malignant neoplasm: Secondary | ICD-10-CM | POA: Diagnosis not present

## 2017-01-18 DIAGNOSIS — Z1501 Genetic susceptibility to malignant neoplasm of breast: Secondary | ICD-10-CM | POA: Diagnosis not present

## 2017-01-18 DIAGNOSIS — Z1239 Encounter for other screening for malignant neoplasm of breast: Secondary | ICD-10-CM | POA: Diagnosis not present

## 2017-01-26 ENCOUNTER — Ambulatory Visit (HOSPITAL_COMMUNITY): Payer: 59

## 2017-02-08 ENCOUNTER — Encounter: Payer: Self-pay | Admitting: Family Medicine

## 2017-02-08 ENCOUNTER — Ambulatory Visit (INDEPENDENT_AMBULATORY_CARE_PROVIDER_SITE_OTHER): Payer: 59 | Admitting: Family Medicine

## 2017-02-08 VITALS — BP 112/76 | HR 83 | Temp 97.9°F | Wt 198.0 lb

## 2017-02-08 DIAGNOSIS — J0100 Acute maxillary sinusitis, unspecified: Secondary | ICD-10-CM | POA: Diagnosis not present

## 2017-02-08 MED ORDER — AZITHROMYCIN 250 MG PO TABS: ORAL_TABLET | ORAL | 0 refills | Status: DC

## 2017-02-08 NOTE — Patient Instructions (Signed)
For nasal congestion you can use Afrin nasal spray for 3 days max, Sudafed, saline nasal spray (generic is fine for all). For cough you can try Delsym. Drink enough fluids to make your urine light yellow. For fever/chill/muscle aches you can take over the counter acetaminophen or ibuprofen.  Please come back in if you are not better in 5-7 days or if you develop wheezing, shortness of breath or persistent vomiting.   

## 2017-02-08 NOTE — Progress Notes (Signed)
Subjective:    Patient ID: [name], female    DOB: September 05, 1980, 37 y.o.   MRN: [id number]  HPI This is a 37 yo female who presents today with a month of sore throat, head congestion, maxillary sinus pressure, green nasal drainage. Started as cold, seemed to nearly resolve then symptoms returned. Subjective fever, exhausted. Little relief with OTC antihistamines, decongestants. 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   Past Medical History:  Diagnosis Date  . BRCA1 positive   . Cancer [facility])    hydatidiform mole  . Family history of ovarian cancer   . 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 Sore throat    recent 10/17/2014 to see PCP on 10/17/14    Past Surgical History:  Procedure Laterality Date  . ABDOMINAL HYSTERECTOMY  12/17/2010  . CESAREAN SECTION  2004, 2006  . DILATION AND CURETTAGE OF UTERUS    . DILATION AND EVACUATION  08/12/2010   Procedure: DILATATION AND EVACUATION (D&E);  Surgeon: Melina Schools, MD;  Location: [facility];  Service: Gynecology;  Laterality: N/A;  . LAPAROSCOPIC OOPHERECTOMY Bilateral 10/22/2014   Procedure: BILATERAL LAPAROSCOPIC OOPHERECTOMY;  Surgeon: Nancy Marus, MD;  Location: [facility];  Service: Gynecology;  Laterality: Bilateral;   Family History  Problem Relation Age of Onset  . Ovarian cancer Mother 87  . BRCA 1/2 Mother        BRCA1 mutation  . Ovarian cancer Maternal Aunt 42  . Diabetes Other   . Diabetes Other   . Coronary artery disease Other   . Breast cancer Cousin 49  . Cancer Other        "abdominal ca" in 3 sisters of mat grandfather   Social History   Tobacco Use  . Smoking status: Never Smoker  . Smokeless tobacco: Never Used  Substance Use Topics  . Alcohol use: Yes    Alcohol/week: 0.0 oz    Comment: rarely  . Drug use: No      Review of Systems Per HPI    Objective:   Physical Exam  Constitutional: She is oriented to person, place, and time. She appears well-developed and  well-nourished. She appears ill. No distress.  HENT:  Head: Normocephalic and atraumatic.  Right Ear: Tympanic membrane, external ear and ear canal normal.  Left Ear: Tympanic membrane, external ear and ear canal normal.  Nose: Mucosal edema and rhinorrhea present. Right sinus exhibits maxillary sinus tenderness. Right sinus exhibits no frontal sinus tenderness. Left sinus exhibits maxillary sinus tenderness. Left sinus exhibits no frontal sinus tenderness.  Mouth/Throat: Uvula is midline and mucous membranes are normal. Posterior oropharyngeal erythema present. No oropharyngeal exudate, posterior oropharyngeal edema or tonsillar abscesses.  Eyes: Conjunctivae are normal.  Neck: Normal range of motion. Neck supple.  Cardiovascular: Normal rate, regular rhythm and normal heart sounds.  Pulmonary/Chest: Effort normal and breath sounds normal.  Lymphadenopathy:    She has no cervical adenopathy.  Neurological: She is alert and oriented to person, place, and time.  Skin: Skin is warm and dry. She is not diaphoretic.  Psychiatric: She has a normal mood and affect. Her behavior is normal. Judgment and thought content normal.  Vitals reviewed.     BP 112/76 (BP Location: Left Arm, Patient Position: Sitting, Cuff Size: Normal)   Pulse 83   Temp 97.9 F (36.6 C) (Oral)   Wt 198 lb (89.8 kg)   LMP 05/31/2010   SpO2 97%   BMI 31.01 kg/m  Wt  Readings from Last 3 Encounters:  02/08/17 198 lb (89.8 kg)  09/22/16 194 lb (88 kg)  09/22/16 196 lb 3.2 oz (89 kg)       Assessment & Plan:  1. Acute non-recurrent maxillary sinusitis - Provided written and verbal information regarding diagnosis and treatment. - For nasal congestion you can use Afrin nasal spray for 3 days max, Sudafed, saline nasal spray (generic is fine for all). For cough you can try Delsym. Drink enough fluids to make your urine light yellow. For fever/chill/muscle aches you can take over the counter acetaminophen or  ibuprofen.  Please come back in if you are not better in 5-7 days or if you develop wheezing, shortness of breath or persistent vomiting. - given persistence of symptoms, will cover with antibiotic - azithromycin (ZITHROMAX) 250 MG tablet; Take 2 tabs PO x 1 dose, then 1 tab PO QD x 4 days  Dispense: 6 tablet; Refill: 0   Clarene Reamer, FNP-BC  [facility], [place] [facility]  02/11/2017 7:29 AM

## 2017-02-10 DIAGNOSIS — N6012 Diffuse cystic mastopathy of left breast: Secondary | ICD-10-CM | POA: Diagnosis not present

## 2017-02-10 DIAGNOSIS — N6320 Unspecified lump in the left breast, unspecified quadrant: Secondary | ICD-10-CM | POA: Diagnosis not present

## 2017-02-10 DIAGNOSIS — Z9889 Other specified postprocedural states: Secondary | ICD-10-CM | POA: Diagnosis not present

## 2017-02-10 DIAGNOSIS — N6321 Unspecified lump in the left breast, upper outer quadrant: Secondary | ICD-10-CM | POA: Diagnosis not present

## 2017-02-10 DIAGNOSIS — R928 Other abnormal and inconclusive findings on diagnostic imaging of breast: Secondary | ICD-10-CM | POA: Diagnosis not present

## 2017-02-11 ENCOUNTER — Encounter: Payer: Self-pay | Admitting: Family Medicine

## 2017-03-02 HISTORY — PX: LASIK: SHX215

## 2017-03-08 ENCOUNTER — Telehealth: Payer: Self-pay | Admitting: *Deleted

## 2017-03-08 ENCOUNTER — Telehealth: Payer: Self-pay | Admitting: Medical Oncology

## 2017-03-08 NOTE — Telephone Encounter (Signed)
Pt called and left message re:  Requesting to cancel f/u appt with Dr. Burr Medico in August.  Pt is being followed by Dr. Juan Quam @ [facility] - breast specialist. Pt also requested medical records sent to [facility] to clarify needs for frequent MRI procedures.  Call transferred to [facility] in [place] for assistance. Pt's    Phone      [phone].

## 2017-03-08 NOTE — Telephone Encounter (Signed)
Pt called to cancel appt  [name], "this summer". She is going to [facility] , She needs records faxed .

## 2017-03-13 ENCOUNTER — Encounter: Payer: Self-pay | Admitting: Family Medicine

## 2017-03-13 ENCOUNTER — Ambulatory Visit (INDEPENDENT_AMBULATORY_CARE_PROVIDER_SITE_OTHER): Payer: 59 | Admitting: Family Medicine

## 2017-03-13 ENCOUNTER — Telehealth: Payer: Self-pay | Admitting: Hematology

## 2017-03-13 VITALS — BP 116/72 | HR 66 | Temp 98.8°F | Ht 66.25 in | Wt 200.2 lb

## 2017-03-13 DIAGNOSIS — Z01419 Encounter for gynecological examination (general) (routine) without abnormal findings: Secondary | ICD-10-CM

## 2017-03-13 DIAGNOSIS — Z0000 Encounter for general adult medical examination without abnormal findings: Secondary | ICD-10-CM | POA: Diagnosis not present

## 2017-03-13 LAB — CBC WITH DIFFERENTIAL/PLATELET
BASOS ABS: 69 {cells}/uL (ref 0–200)
Basophils Relative: 1.1 %
EOS PCT: 1.3 %
Eosinophils Absolute: 82 cells/uL (ref 15–500)
HCT: 41 % (ref 35.0–45.0)
Hemoglobin: 13.6 g/dL (ref 11.7–15.5)
Lymphs Abs: 1499 cells/uL (ref 850–3900)
MCH: 28.9 pg (ref 27.0–33.0)
MCHC: 33.2 g/dL (ref 32.0–36.0)
MCV: 87.2 fL (ref 80.0–100.0)
MONOS PCT: 5.3 %
MPV: 11.9 fL (ref 7.5–12.5)
Neutro Abs: 4316 cells/uL (ref 1500–7800)
Neutrophils Relative %: 68.5 %
Platelets: 236 10*3/uL (ref 140–400)
RBC: 4.7 10*6/uL (ref 3.80–5.10)
RDW: 12.6 % (ref 11.0–15.0)
TOTAL LYMPHOCYTE: 23.8 %
WBC mixed population: 334 cells/uL (ref 200–950)
WBC: 6.3 10*3/uL (ref 3.8–10.8)

## 2017-03-13 LAB — COMPREHENSIVE METABOLIC PANEL
ALK PHOS: 84 U/L (ref 39–117)
ALT: 19 U/L (ref 0–35)
AST: 21 U/L (ref 0–37)
Albumin: 4.5 g/dL (ref 3.5–5.2)
BUN: 11 mg/dL (ref 6–23)
CHLORIDE: 103 meq/L (ref 96–112)
CO2: 29 meq/L (ref 19–32)
Calcium: 9.6 mg/dL (ref 8.4–10.5)
Creatinine, Ser: 0.69 mg/dL (ref 0.40–1.20)
GFR: 101.19 mL/min (ref >60.00)
GLUCOSE: 111 mg/dL — AB (ref 70–99)
POTASSIUM: 4.8 meq/L (ref 3.5–5.1)
SODIUM: 141 meq/L (ref 135–145)
TOTAL PROTEIN: 7.8 g/dL (ref 6.0–8.3)
Total Bilirubin: 0.5 mg/dL (ref 0.2–1.2)

## 2017-03-13 LAB — LIPID PANEL
CHOL/HDL RATIO: 4
Cholesterol: 204 mg/dL — ABNORMAL HIGH (ref 0–200)
HDL: 55.9 mg/dL (ref >39.00)
LDL CALC: 121 mg/dL — AB (ref 0–99)
NONHDL: 147.79
Triglycerides: 133 mg/dL (ref 0.0–149.0)
VLDL: 26.6 mg/dL (ref 0.0–40.0)

## 2017-03-13 LAB — TSH: TSH: 1.01 u[IU]/mL (ref 0.35–4.50)

## 2017-03-13 NOTE — Addendum Note (Signed)
Addended by: Marrion Coy on: 03/13/2017 11:49 AM   Modules accepted: Orders

## 2017-03-13 NOTE — Telephone Encounter (Signed)
PER PT - FAXED OFFICE NOTES TO [facility] [phone]

## 2017-03-13 NOTE — Patient Instructions (Signed)
Great to see you. I will call you with your lab results from today and you can view them online.   

## 2017-03-13 NOTE — Assessment & Plan Note (Signed)
Reviewed preventive care protocols, scheduled due services, and updated immunizations Discussed nutrition, exercise, diet, and healthy lifestyle.  Orders Placed This Encounter  Procedures  . CBC with Differential/Platelet  . Comprehensive metabolic panel  . Lipid panel  . TSH     

## 2017-03-13 NOTE — Progress Notes (Signed)
Subjective:   Patient ID: [name], female    DOB: [birth year], 37 y.o.   MRN: [id number]  [name] is a pleasant 37 y.o. year old female who presents to clinic today with Annual Exam (Patient is here today for a CPE.  She had a total hysterectomy and was last checked last summer by Dr. Alycia Rossetti.  She is currently fasting today.  She thinks that she had her Tdap and will send this info via her MyChart.)  on 03/13/2017  HPI:  Followed by [facility]- h/o GTD and BRCA mutation. Remote h/o hysterectomy for molar pregnancy in 2012, risk reducing BSO in 2016.  Was last seen in breast cancer clinic on 12/09/16.  Note reviewed. Double mastectomy was discussed at that time.  She has not yet decided when she will have this surgery.  Mom died of ovarian CA 4 years ago.  Last BME done by Dr. Paul Half (gyn/onc) on 08/24/16.  Note reviewed.   Wt Readings from Last 3 Encounters:  03/13/17 200 lb 3.2 oz (90.8 kg)  02/08/17 198 lb (89.8 kg)  09/22/16 194 lb (88 kg)     Lab Results  Component Value Date   CHOL 178 01/07/2016   HDL 60 01/07/2016   LDLCALC 96 01/07/2016   TRIG 109 01/07/2016   CHOLHDL 3.0 01/07/2016   Lab Results  Component Value Date   CREATININE 0.8 01/07/2016   Lab Results  Component Value Date   TSH 2.93 02/26/2014   Lab Results  Component Value Date   WBC 4.8 01/07/2016   HGB 12.9 01/07/2016   HCT 38.2 01/07/2016   MCV 88.2 01/07/2016   PLT 213 01/07/2016   Current Outpatient Medications on File Prior to Visit  Medication Sig Dispense Refill  . Cholecalciferol (VITAMIN D) 2000 units tablet Take 2,000 Units by mouth daily.    Marland Kitchen estradiol (CLIMARA - DOSED IN MG/24 HR) 0.0375 mg/24hr patch Place 1 patch (0.0375 mg total) onto the skin once a week. 12 patch 4  . ibuprofen (ADVIL,MOTRIN) 200 MG tablet Take 400 mg by mouth daily as needed. For pain.     . Multiple Vitamins-Minerals (MULTIVITAMIN PO) Take 1 tablet by mouth daily.     No  current facility-administered medications on file prior to visit.     No Known Allergies  Past Medical History:  Diagnosis Date  . BRCA1 positive   . 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 Family history of ovarian cancer   . 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 Sore throat    recent 10/17/2014 to see PCP on 10/17/14     Past Surgical History:  Procedure Laterality Date  . ABDOMINAL HYSTERECTOMY  12/17/2010  . CESAREAN SECTION  2004, 2006  . DILATION AND CURETTAGE OF UTERUS    . DILATION AND EVACUATION  08/12/2010   Procedure: DILATATION AND EVACUATION (D&E);  Surgeon: Melina Schools, MD;  Location: [facility];  Service: Gynecology;  Laterality: N/A;  . LAPAROSCOPIC OOPHERECTOMY Bilateral 10/22/2014   Procedure: BILATERAL LAPAROSCOPIC OOPHERECTOMY;  Surgeon: Nancy Marus, MD;  Location: [facility];  Service: Gynecology;  Laterality: Bilateral;  . LASIK Bilateral 03/02/2017    Family History  Problem Relation Age of Onset  . Ovarian cancer Mother 40  . BRCA 1/2 Mother        BRCA1 mutation  . Ovarian cancer Maternal Aunt 47  . Diabetes Other   . Diabetes Other   . Coronary artery  disease Other   . Breast cancer Cousin 24  . Cancer Other        "abdominal ca" in 3 sisters of mat grandfather    Social History   Socioeconomic History  . Marital status: Married    Spouse name: Not on file  . Number of children: Not on file  . Years of education: Not on file  . Highest education level: Not on file  Social Needs  . Financial resource strain: Not on file  . Food insecurity - worry: Not on file  . Food insecurity - inability: Not on file  . Transportation needs - medical: Not on file  . Transportation needs - non-medical: Not on file  Occupational History  . Not on file  Tobacco Use  . Smoking status: Never Smoker  . Smokeless tobacco: Never Used  Substance and Sexual Activity  . Alcohol use: Yes    Alcohol/week: 0.0 oz    Comment: rarely  . Drug  use: No  . Sexual activity: Yes    Birth control/protection: None  Other Topics Concern  . Not on file  Social History Narrative  . Not on file   The PMH, PSH, Social History, Family History, Medications, and allergies have been reviewed in [facility], and have been updated if relevant.    Review of Systems  Constitutional: Negative.  Negative for unexpected weight change.  HENT: Negative.   Respiratory: Negative.   Cardiovascular: Negative.   Gastrointestinal: Negative.   Musculoskeletal: Negative.   Allergic/Immunologic: Negative.   Neurological: Negative.   Hematological: Negative.   Psychiatric/Behavioral: Negative.  Negative for dysphoric mood, self-injury, sleep disturbance and suicidal ideas.  All other systems reviewed and are negative.      Objective:    BP 116/72 (BP Location: Left Arm, Patient Position: Sitting, Cuff Size: Normal)   Pulse 66   Temp 98.8 F (37.1 C) (Oral)   Ht 5' 6.25" (1.683 m)   Wt 200 lb 3.2 oz (90.8 kg)   LMP 05/31/2010   SpO2 98%   BMI 32.07 kg/m    Physical Exam   General:  Well-developed,well-nourished,in no acute distress; alert,appropriate and cooperative throughout examination Head:  normocephalic and atraumatic.   Eyes:  vision grossly intact, PERRL Ears:  R ear normal and L ear normal externally, TMs clear bilaterally Nose:  no external deformity.   Mouth:  good dentition.   Neck:  No deformities, masses, or tenderness noted. Breasts:  No mass, nodules, thickening, tenderness, bulging, retraction, inflamation, nipple discharge or skin changes noted.   Lungs:  Normal respiratory effort, chest expands symmetrically. Lungs are clear to auscultation, no crackles or wheezes. Heart:  Normal rate and regular rhythm. S1 and S2 normal without gallop, murmur, click, rub or other extra sounds. Abdomen:  Bowel sounds positive,abdomen soft and non-tender without masses, organomegaly or hernias noted. Msk:  No deformity or scoliosis noted of  thoracic or lumbar spine.   Extremities:  No clubbing, cyanosis, edema, or deformity noted with normal full range of motion of all joints.   Neurologic:  alert & oriented X3 and gait normal.   Skin:  Intact without suspicious lesions or rashes Cervical Nodes:  No lymphadenopathy noted Axillary Nodes:  No palpable lymphadenopathy Psych:  Cognition and judgment appear intact. Alert and cooperative with normal attention span and concentration. No apparent delusions, illusions, hallucinations       Assessment & Plan:   Well woman exam - Plan: CBC with Differential/Platelet, Comprehensive metabolic panel, Lipid panel, TSH  No Follow-up on file.

## 2017-04-24 DIAGNOSIS — Z9889 Other specified postprocedural states: Secondary | ICD-10-CM | POA: Diagnosis not present

## 2017-07-27 ENCOUNTER — Encounter: Payer: 59 | Attending: Family Medicine | Admitting: Registered"

## 2017-07-27 ENCOUNTER — Encounter: Payer: Self-pay | Admitting: Registered"

## 2017-07-27 DIAGNOSIS — Z713 Dietary counseling and surveillance: Secondary | ICD-10-CM | POA: Insufficient documentation

## 2017-07-27 NOTE — Progress Notes (Signed)
Medical Nutrition Therapy:  Appt start time: 0800 end time:  0850.  [employer] Employee visit 1 of 3  Assessment:  Primary concerns today: Pt states she would like to work on some better eating habits. Pt states she is BRCA1 positive and had ovaries removed for preventative measure and on estrogen patch.  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 Pt reports that on days off she switches her sleep schedule to be awake during the day.   Stress: 3 out of 10 Sleep: about 8 hours broken into 2 different sleeping times. On days off may sleep 12 hrs.   Pt states after work she will often stop and pick up doughnuts for her family. Patient reports sometimes when shopping in the mall she and her daughter will split a [employer].  Preferred Learning Style:   No preference indicated   Learning Readiness:   Ready  MEDICATIONS: reviewed   DIETARY INTAKE:  24-hr recall: (not assessed this visit) B ( AM):   Snk ( AM):  L ( PM):  Snk ( PM): D ( PM): Snk ( PM):  Beverages:   Usual physical activity: 2-3x/week walks 2.5-3 miles  Estimated energy needs: 1800 calories  Progress Towards Goal(s):  New goal.   Nutritional Diagnosis:  NI-5.8.2 Excessive carbohydrate intake As related to pastries, non-hunger eating.  As evidenced by dietary recall.    Intervention:  Nutrition Education. Discussed the link between sleep, stress, being on a schedule and food. Discussed mindful/mindless eating.  Plan:   Aim to get into a regular schedule with eating, sleep and exercise  Keep making your oatmeal when it feels good to do so, consider adding walnuts.  When you get the urge to stop and get doughnuts, investigate what is driving you to do so.  Consider checking out the book Mindless Eating by Dia Crawford, Ph.D.  Teaching Method Utilized:  Visual Auditory  Handouts given during visit include:  Sleep Hygiene  Barriers to  learning/adherence to lifestyle change: none  Demonstrated degree of understanding via:  Teach Back   Monitoring/Evaluation:  Dietary intake, exercise, sleep, and body weight in 1 month(s).

## 2017-07-27 NOTE — Patient Instructions (Signed)
   Aim to get into a regular schedule with eating, sleep and exercise  Keep making your oatmeal when it feels good to do so, consider adding walnuts.  When you get the urge to stop and get doughnuts, investigate what is driving you to do so.  Consider checking out the book Mindless Eating by Dia Crawford, Ph.D.

## 2017-08-08 ENCOUNTER — Ambulatory Visit: Payer: 59 | Admitting: Registered"

## 2017-08-08 ENCOUNTER — Encounter: Payer: 59 | Attending: Family Medicine | Admitting: Registered"

## 2017-08-08 DIAGNOSIS — Z713 Dietary counseling and surveillance: Secondary | ICD-10-CM | POA: Diagnosis not present

## 2017-08-08 NOTE — Progress Notes (Signed)
Medical Nutrition Therapy:  Appt start time: 1400 end time:  1430.  [facility] visit 2 of 3  Assessment:  Primary concerns today: Pt states she has been more mindful about her food choices and asking herself "am I hungry?". Pt states she continues with exercise and on saturday did 14 miles biking around [facility].   Patient states she cooked the [place] canned biscuits for her family and when eating out likes to get the biscuit breakfast sandwiches. Patient states she would like to work on getting more vegetables in her diet.   Patient states she was told that milk makes you fat and next visit RD will cover the dairy topic.  Preferred Learning Style:   No preference indicated   Learning Readiness:   Ready  MEDICATIONS: reviewed   DIETARY INTAKE:  24-hr recall:  B (11 AM): (brunch) scrambled eggs, [place] bacon, 1 slice provolone cheese, piece of bagel Snk ( AM): cantaloupe  L ( PM):  Snk ( PM): D ( PM): roasted chicken, lettuce, jalapeno, corn tortilla Snk ( PM): yogurt, granola & walnuts Beverages: water, pierre lime, coffee   Usual physical activity: continues 2-3x/week walks 2.5-3 miles or rides bikes  Estimated energy needs: 1800 calories  Progress Towards Goal(s):  New goal.   Nutritional Diagnosis:  NI-5.8.2 Excessive carbohydrate intake As related to pastries, non-hunger eating.  As evidenced by dietary recall.    Intervention:  Nutrition Education. Discussed trans fat and food sources. Discussed ways to get more vegetables in diet.  Plan:  Consider choosing English muffin over biscuit when eating out. Consider making your own biscuits instead of using the canned. Consider reading the ingredients on shelf stable foods and avoid partially hydrogenated vegetable oils (trans fats) recipe ideas to get more vegetables in your diet ShoppingMeter.[email or web address]  Teaching Method Utilized:  Visual Auditory  Handouts given  during visit include:  none  Barriers to learning/adherence to lifestyle change: none  Demonstrated degree of understanding via:  Teach Back   Monitoring/Evaluation:  Dietary intake, exercise, sleep, and body weight in 1 month(s).

## 2017-08-08 NOTE — Patient Instructions (Signed)
Consider choosing muffin over biscuit when eating out. Consider making your own biscuits instead of using the canned. Consider reading the ingredients on shelf stable foods and avoid partially hydrogenated vegetable oils (trans fats) recipe ideas to get more vegetables in your diet [email or web address]

## 2017-08-09 ENCOUNTER — Ambulatory Visit: Payer: 59 | Admitting: Registered"

## 2017-08-10 ENCOUNTER — Encounter: Payer: Self-pay | Admitting: Registered"

## 2017-08-23 ENCOUNTER — Ambulatory Visit: Payer: 59 | Admitting: Registered"

## 2017-08-30 ENCOUNTER — Encounter: Payer: 59 | Admitting: Registered"

## 2017-08-30 DIAGNOSIS — Z713 Dietary counseling and surveillance: Secondary | ICD-10-CM

## 2017-08-30 NOTE — Progress Notes (Signed)
Medical Nutrition Therapy:  Appt start time: 5056 end time:  9794.  [facility] visit 3 of 3  Assessment:  Primary concerns today: Pt states her schedule has been changing and got out of her exercise routine, but feels it will be easy to get back once school starts. Pt states she is still thinking about ways to change her work schedule so she get on a more consistent sleep schedule as well.   Patient states her neighbor gave her tips on how to make homemade biscuits for her family.  Preferred Learning Style:   No preference indicated   Learning Readiness:   Ready  MEDICATIONS: reviewed   DIETARY INTAKE:  24-hr recall: did not assess this visit  Usual physical activity: ADLs  Estimated energy needs: 1800 calories  Progress Towards Goal(s):  New goal.   Nutritional Diagnosis:  NI-5.8.2 Excessive carbohydrate intake As related to pastries, non-hunger eating.  As evidenced by dietary recall.    Intervention:  Nutrition Education. Discussed potential benefit of organic food (dirty dozen/clean 15) to reduce pesticide exposure and reduce cancer risk.   Plan:  Aim to get back into your exercise routing when school starts Consider eat 3 balanced meals per day  Continue to think of ways to get into a regular sleep cycle  Teaching Method Utilized:  Visual Auditory  Handouts given during visit include:  none   Barriers to learning/adherence to lifestyle change: none  Demonstrated degree of understanding via:  Teach Back   Monitoring/Evaluation:  Dietary intake, exercise, sleep, and body weight prn.

## 2017-08-30 NOTE — Patient Instructions (Signed)
Aim to get back into your exercise routing when school starts Consider eat 3 balanced meals per day  Continue to think of ways to get into a regular sleep cycle

## 2017-09-07 DIAGNOSIS — Z79890 Hormone replacement therapy (postmenopausal): Secondary | ICD-10-CM | POA: Diagnosis not present

## 2017-09-07 DIAGNOSIS — Z1379 Encounter for other screening for genetic and chromosomal anomalies: Secondary | ICD-10-CM | POA: Diagnosis not present

## 2017-09-07 DIAGNOSIS — Z1231 Encounter for screening mammogram for malignant neoplasm of breast: Secondary | ICD-10-CM | POA: Diagnosis not present

## 2017-09-07 DIAGNOSIS — Z1501 Genetic susceptibility to malignant neoplasm of breast: Secondary | ICD-10-CM | POA: Diagnosis not present

## 2017-09-07 DIAGNOSIS — E8940 Asymptomatic postprocedural ovarian failure: Secondary | ICD-10-CM | POA: Diagnosis not present

## 2017-09-07 DIAGNOSIS — Z803 Family history of malignant neoplasm of breast: Secondary | ICD-10-CM | POA: Diagnosis not present

## 2017-09-07 DIAGNOSIS — Z1239 Encounter for other screening for malignant neoplasm of breast: Secondary | ICD-10-CM | POA: Diagnosis not present

## 2017-09-07 DIAGNOSIS — D229 Melanocytic nevi, unspecified: Secondary | ICD-10-CM | POA: Diagnosis not present

## 2017-09-07 DIAGNOSIS — Z1509 Genetic susceptibility to other malignant neoplasm: Secondary | ICD-10-CM | POA: Diagnosis not present

## 2017-09-22 ENCOUNTER — Ambulatory Visit: Payer: 59 | Admitting: Hematology

## 2017-10-27 ENCOUNTER — Other Ambulatory Visit: Payer: Self-pay | Admitting: Gynecologic Oncology

## 2017-10-27 DIAGNOSIS — N951 Menopausal and female climacteric states: Secondary | ICD-10-CM

## 2017-10-27 MED ORDER — ESTRADIOL 0.0375 MG/24HR TD PTWK: 0.0375 mg | MEDICATED_PATCH | TRANSDERMAL | 0 refills | Status: DC

## 2017-10-27 NOTE — Progress Notes (Signed)
One month refill given then patient needs to make an appointment to receive any further refills since she was due to be seen in 07/2017.

## 2017-11-03 DIAGNOSIS — Z1389 Encounter for screening for other disorder: Secondary | ICD-10-CM | POA: Diagnosis not present

## 2017-11-03 DIAGNOSIS — Z1501 Genetic susceptibility to malignant neoplasm of breast: Secondary | ICD-10-CM | POA: Diagnosis not present

## 2017-11-10 ENCOUNTER — Other Ambulatory Visit: Payer: Self-pay | Admitting: Gynecologic Oncology

## 2017-11-10 DIAGNOSIS — N951 Menopausal and female climacteric states: Secondary | ICD-10-CM

## 2017-11-14 ENCOUNTER — Other Ambulatory Visit: Payer: Self-pay | Admitting: Gynecologic Oncology

## 2017-11-14 DIAGNOSIS — N951 Menopausal and female climacteric states: Secondary | ICD-10-CM

## 2018-02-08 ENCOUNTER — Ambulatory Visit: Payer: 59 | Admitting: Family Medicine

## 2018-02-08 ENCOUNTER — Ambulatory Visit (INDEPENDENT_AMBULATORY_CARE_PROVIDER_SITE_OTHER): Payer: 59 | Admitting: Family Medicine

## 2018-02-08 ENCOUNTER — Encounter: Payer: Self-pay | Admitting: Family Medicine

## 2018-02-08 VITALS — BP 106/68 | HR 65 | Temp 98.4°F | Ht 66.0 in | Wt 200.0 lb

## 2018-02-08 DIAGNOSIS — B370 Candidal stomatitis: Secondary | ICD-10-CM | POA: Diagnosis not present

## 2018-02-08 MED ORDER — NYSTATIN 100000 UNIT/ML MT SUSP: 5.0000 mL | Freq: Four times a day (QID) | OROMUCOSAL | 0 refills | Status: DC

## 2018-02-08 NOTE — Patient Instructions (Signed)
Oral Thrush, Adult  Oral thrush is an infection in your mouth and throat. It causes white patches on your tongue and in your mouth. Follow these instructions at home: Helping with soreness   To lessen your pain: ? Drink cold liquids, like water and iced tea. ? Eat frozen ice pops or frozen juices. ? Eat foods that are easy to swallow, like gelatin and ice cream. ? Drink from a straw if the patches in your mouth are painful. General instructions  Take or use over-the-counter and prescription medicines only as told by your doctor. Medicine for oral thrush may be something to swallow, or it may be something to put on the infected area.  Eat plain yogurt that has live cultures in it. Read the label to make sure.  If you wear dentures: ? Take out your dentures before you go to bed. ? Brush them well. ? Soak them in a denture cleaner.  Rinse your mouth with warm salt-water many times a day. To make the salt-water mixture, completely dissolve 1/2-1 teaspoon of salt in 1 cup of warm water. Contact a doctor if:  Your problems are getting worse.  Your problems do not get better in less than 7 days with treatment.  Your infection is spreading. This may show as white patches on the skin outside of your mouth.  You are nursing your baby and you have redness and pain in the nipples. This information is not intended to replace advice given to you by your health care provider. Make sure you discuss any questions you have with your health care provider. Document Released: 04/13/2009 Document Revised: 10/12/2015 Document Reviewed: 10/12/2015 Elsevier Interactive Patient Education  2019 Elsevier Inc.  

## 2018-02-08 NOTE — Progress Notes (Signed)
Subjective:   Patient ID: [name], female    DOB: 02-Oct-1980, 38 y.o.   MRN: [id number]  [name] is a pleasant 38 y.o. year old female who presents to clinic today with Sores on Tongue (Patient is here today C/O sores in her mouth.  She has little red sores inside of top and bottom lips, and tip and sides of tongue.  They are little red places, the one ones inside bottom lip bled 3-weeks-ago which is when it presented.  It gets better then worsens again.  She tells me that the estrogen patch was increased the beginning of December due to surgically enduced menopause.  Wonders if this could be the culprit? Had [facility] put in 1st week in Dec as well.)  on 02/08/2018  HPI:  Sores on her tongue- Noticed little red sores on the inside of top and bottom of her tongue and lips.  Very raw.  Does not hurt to swallow. Started estrogen in 12/2017 and wonders if this is the culprit.  No recent abx use ans has not been sick recently.  Current Outpatient Medications on File Prior to Visit  Medication Sig Dispense Refill  . Cholecalciferol (VITAMIN D) 2000 units tablet Take 2,000 Units by mouth daily.    Marland Kitchen estradiol (CLIMARA - DOSED IN MG/24 HR) 0.075 mg/24hr patch Place 0.075 mg onto the skin once a week.    Marland Kitchen ibuprofen (ADVIL,MOTRIN) 200 MG tablet Take 400 mg by mouth daily as needed. For pain.     . Multiple Vitamins-Minerals (MULTIVITAMIN PO) Take 1 tablet by mouth daily.     No current facility-administered medications on file prior to visit.     No Known Allergies  Past Medical History:  Diagnosis Date  . BRCA1 positive   . 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 Family history of ovarian cancer   . 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 Sore throat    recent 10/17/2014 to see PCP on 10/17/14     Past Surgical History:  Procedure Laterality Date  . ABDOMINAL HYSTERECTOMY  12/17/2010  . CESAREAN SECTION  2004, 2006  . DILATION AND CURETTAGE OF UTERUS      . DILATION AND EVACUATION  08/12/2010   Procedure: DILATATION AND EVACUATION (D&E);  Surgeon: Melina Schools, MD;  Location: [facility];  Service: Gynecology;  Laterality: N/A;  . LAPAROSCOPIC OOPHERECTOMY Bilateral 10/22/2014   Procedure: BILATERAL LAPAROSCOPIC OOPHERECTOMY;  Surgeon: Nancy Marus, MD;  Location: [facility];  Service: Gynecology;  Laterality: Bilateral;  . LASIK Bilateral 03/02/2017    Family History  Problem Relation Age of Onset  . Ovarian cancer Mother 81  . BRCA 1/2 Mother        BRCA1 mutation  . Ovarian cancer Maternal Aunt 70  . Diabetes Other   . Diabetes Other   . Coronary artery disease Other   . Breast cancer Cousin 60  . Cancer Other        "abdominal ca" in 3 sisters of mat grandfather    Social History   Socioeconomic History  . Marital status: Married    Spouse name: Not on file  . Number of children: Not on file  . Years of education: Not on file  . Highest education level: Not on file  Occupational History  . Not on file  Social Needs  . Financial resource strain: Not on file  . Food insecurity:    Worry: Not on file  Inability: Not on file  . Transportation needs:    Medical: Not on file    Non-medical: Not on file  Tobacco Use  . Smoking status: Never Smoker  . Smokeless tobacco: Never Used  Substance and Sexual Activity  . Alcohol use: Yes    Alcohol/week: 0.0 standard drinks    Comment: rarely  . Drug use: No  . Sexual activity: Yes    Birth control/protection: None  Lifestyle  . Physical activity:    Days per week: Not on file    Minutes per session: Not on file  . Stress: Not on file  Relationships  . Social connections:    Talks on phone: Not on file    Gets together: Not on file    Attends religious service: Not on file    Active member of club or organization: Not on file    Attends meetings of clubs or organizations: Not on file    Relationship status: Not on file  . Intimate partner violence:    Fear of current or  ex partner: Not on file    Emotionally abused: Not on file    Physically abused: Not on file    Forced sexual activity: Not on file  Other Topics Concern  . Not on file  Social History Narrative  . Not on file   The PMH, PSH, Social History, Family History, Medications, and allergies have been reviewed in [facility], and have been updated if relevant.   Review of Systems  Constitutional: Negative.   HENT: Positive for sore throat. Negative for rhinorrhea, sinus pressure, sinus pain, sneezing, tinnitus, trouble swallowing and voice change.   Gastrointestinal: Negative.   Neurological: Negative.   Psychiatric/Behavioral: Negative.   All other systems reviewed and are negative.      Objective:    BP 106/68 (BP Location: Left Arm, Patient Position: Sitting, Cuff Size: Normal)   Pulse 65   Temp 98.4 F (36.9 C) (Oral)   Ht _0  (1.676 m)   Wt 200 lb (90.7 kg)   LMP 05/31/2010   SpO2 98%   BMI 32.28 kg/m    Physical Exam Vitals signs and nursing note reviewed.  Constitutional:      Appearance: Normal appearance. She is obese.  HENT:     Head: Normocephalic and atraumatic.     Right Ear: Tympanic membrane normal.     Left Ear: Tympanic membrane normal.     Nose: Nose normal.     Mouth/Throat:     Tongue: Lesions present.     Palate: Lesions present.     Comments: Consistent with thrust Eyes:     Pupils: Pupils are equal, round, and reactive to light.  Cardiovascular:     Pulses: Normal pulses.  Neurological:     General: No focal deficit present.     Mental Status: She is alert.  Psychiatric:        Judgment: Judgment normal.           Assessment & Plan:   No diagnosis found. No follow-ups on file.

## 2018-02-08 NOTE — Assessment & Plan Note (Signed)
NO indication of esophageal involvement. Treat with nystatin rinse. Call or return to clinic prn if these symptoms worsen or fail to improve as anticipated. The patient indicates understanding of these issues and agrees with the plan.

## 2018-03-12 DIAGNOSIS — Z1509 Genetic susceptibility to other malignant neoplasm: Secondary | ICD-10-CM | POA: Diagnosis not present

## 2018-03-12 DIAGNOSIS — Z803 Family history of malignant neoplasm of breast: Secondary | ICD-10-CM | POA: Diagnosis not present

## 2018-03-12 DIAGNOSIS — K769 Liver disease, unspecified: Secondary | ICD-10-CM | POA: Diagnosis not present

## 2018-03-12 DIAGNOSIS — Z1239 Encounter for other screening for malignant neoplasm of breast: Secondary | ICD-10-CM | POA: Diagnosis not present

## 2018-03-12 DIAGNOSIS — Z1501 Genetic susceptibility to malignant neoplasm of breast: Secondary | ICD-10-CM | POA: Diagnosis not present

## 2018-03-26 DIAGNOSIS — Z1501 Genetic susceptibility to malignant neoplasm of breast: Secondary | ICD-10-CM | POA: Diagnosis not present

## 2018-03-26 DIAGNOSIS — Z01419 Encounter for gynecological examination (general) (routine) without abnormal findings: Secondary | ICD-10-CM | POA: Diagnosis not present

## 2018-03-26 DIAGNOSIS — Z1509 Genetic susceptibility to other malignant neoplasm: Secondary | ICD-10-CM | POA: Diagnosis not present

## 2018-08-19 NOTE — Progress Notes (Signed)
Subjective:   Patient ID: [name], female    DOB: [birth year], 38 y.o.   MRN: [id number]  [name] is a pleasant 38 y.o. year old female who presents to clinic today with Annual Exam (Pt screened at vehicle.  She is here today for a CPE.  Last Mammogram at [facility] on 1.11.2019. She had a granola bar and coffe at 7:30am.)  on 08/20/2018  HPI:  Followed by [facility]- h/o GTD and BRCA mutation. Remote h/o hysterectomy for molar pregnancy in 2012, risk reducing BSO in 2016.She has used hormone replacement therapy in the form of an estradiol patch.   Left breast mass- results reviewed from [facility]. MRI breast biopsy 02/10/2017- left breast mass.- path negative.  MRI of breast with and without contrast- 03/12/18-  No suspicious masses or axillary adenopathy.   Was last seen in breast cancer clinic on 03/26/18.  Note reviewed. ? Double mastectomy was discussed at that time.  She has not yet decided when she will have this surgery.Marland Kitchen - scheduled q 6 months MRIs Mom died of ovarian CA.   Last BME done by Dr. Paul Half (gyn/onc) on 08/24/16.  Note reviewed.   Wt Readings from Last 3 Encounters:  08/20/18 199 lb 12.8 oz (90.6 kg)  02/08/18 200 lb (90.7 kg)  03/13/17 200 lb 3.2 oz (90.8 kg)     Lab Results  Component Value Date   CHOL 204 (H) 03/13/2017   HDL 55.90 03/13/2017   LDLCALC 121 (H) 03/13/2017   TRIG 133.0 03/13/2017   CHOLHDL 4 03/13/2017   Lab Results  Component Value Date   CREATININE 0.69 03/13/2017   Lab Results  Component Value Date   TSH 1.01 03/13/2017   Lab Results  Component Value Date   WBC 6.3 03/13/2017   HGB 13.6 03/13/2017   HCT 41.0 03/13/2017   MCV 87.2 03/13/2017   PLT 236 03/13/2017   Current Outpatient Medications on File Prior to Visit  Medication Sig Dispense Refill  . Cholecalciferol (VITAMIN D) 2000 units tablet Take 2,000 Units by mouth daily.    Marland Kitchen estradiol (CLIMARA - DOSED IN MG/24 HR) 0.075 mg/24hr patch Place  0.075 mg onto the skin once a week.    Marland Kitchen ibuprofen (ADVIL,MOTRIN) 200 MG tablet Take 400 mg by mouth daily as needed. For pain.     . Multiple Vitamins-Minerals (MULTIVITAMIN PO) Take 1 tablet by mouth daily.     No current facility-administered medications on file prior to visit.     Allergies  Allergen Reactions  . Gadobenate Hives    Past Medical History:  Diagnosis Date  . BRCA1 positive   . 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 Family history of ovarian cancer   . 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 Sore throat    recent 10/17/2014 to see PCP on 10/17/14     Past Surgical History:  Procedure Laterality Date  . ABDOMINAL HYSTERECTOMY  12/17/2010  . CESAREAN SECTION  2004, 2006  . DILATION AND CURETTAGE OF UTERUS    . DILATION AND EVACUATION  08/12/2010   Procedure: DILATATION AND EVACUATION (D&E);  Surgeon: Melina Schools, MD;  Location: [place] [facility];  Service: Gynecology;  Laterality: N/A;  . LAPAROSCOPIC OOPHERECTOMY Bilateral 10/22/2014   Procedure: BILATERAL LAPAROSCOPIC OOPHERECTOMY;  Surgeon: Nancy Marus, MD;  Location: [facility];  Service: Gynecology;  Laterality: Bilateral;  . LASIK Bilateral 03/02/2017    Family History  Problem Relation Age of  Onset  . Ovarian cancer Mother 80  . BRCA 1/2 Mother        BRCA1 mutation  . Ovarian cancer Maternal Aunt 28  . Diabetes Other   . Diabetes Other   . Coronary artery disease Other   . Breast cancer Cousin 31  . Cancer Other        "abdominal ca" in 3 sisters of mat grandfather    Social History   Socioeconomic History  . Marital status: Married    Spouse name: Not on file  . Number of children: Not on file  . Years of education: Not on file  . Highest education level: Not on file  Occupational History  . Not on file  Social Needs  . Financial resource strain: Not on file  . Food insecurity    Worry: Not on file    Inability: Not on file  . Transportation needs    Medical: Not on file     Non-medical: Not on file  Tobacco Use  . Smoking status: Never Smoker  . Smokeless tobacco: Never Used  Substance and Sexual Activity  . Alcohol use: Yes    Alcohol/week: 0.0 standard drinks    Comment: rarely  . Drug use: No  . Sexual activity: Yes    Birth control/protection: None  Lifestyle  . Physical activity    Days per week: Not on file    Minutes per session: Not on file  . Stress: Not on file  Relationships  . Social Herbalist on phone: Not on file    Gets together: Not on file    Attends religious service: Not on file    Active member of club or organization: Not on file    Attends meetings of clubs or organizations: Not on file    Relationship status: Not on file  . Intimate partner violence    Fear of current or ex partner: Not on file    Emotionally abused: Not on file    Physically abused: Not on file    Forced sexual activity: Not on file  Other Topics Concern  . Not on file  Social History Narrative  . Not on file   The PMH, PSH, Social History, Family History, Medications, and allergies have been reviewed in [facility], and have been updated if relevant.    Review of Systems  Constitutional: Negative.  Negative for unexpected weight change.  HENT: Negative.   Respiratory: Negative.   Cardiovascular: Negative.   Gastrointestinal: Negative.   Musculoskeletal: Negative.   Allergic/Immunologic: Negative.   Neurological: Negative.   Hematological: Negative.   Psychiatric/Behavioral: Negative.  Negative for dysphoric mood, self-injury, sleep disturbance and suicidal ideas.  All other systems reviewed and are negative.      Objective:    BP 124/74 (BP Location: Left Arm, Patient Position: Sitting, Cuff Size: Normal)   Pulse 78   Temp 98.5 F (36.9 C) (Oral)   Ht 5' 6" (1.676 m)   Wt 199 lb 12.8 oz (90.6 kg)   LMP 05/31/2010   SpO2 98%   BMI 32.25 kg/m   Wt Readings from Last 3 Encounters:  08/20/18 199 lb 12.8 oz (90.6 kg)  02/08/18  200 lb (90.7 kg)  03/13/17 200 lb 3.2 oz (90.8 kg)     Physical Exam   General:  Well-developed,well-nourished,in no acute distress; alert,appropriate and cooperative throughout examination Head:  normocephalic and atraumatic.   Eyes:  vision grossly intact, PERRL Ears:  R ear  normal and L ear normal externally, TMs clear bilaterally Nose:  no external deformity.   Mouth:  good dentition.   Neck:  No deformities, masses, or tenderness noted.  Lungs:  Normal respiratory effort, chest expands symmetrically. Lungs are clear to auscultation, no crackles or wheezes. Heart:  Normal rate and regular rhythm. S1 and S2 normal without gallop, murmur, click, rub or other extra sounds. Abdomen:  Bowel sounds positive,abdomen soft and non-tender without masses, organomegaly or hernias noted. Msk:  No deformity or scoliosis noted of thoracic or lumbar spine.   Extremities:  No clubbing, cyanosis, edema, or deformity noted with normal full range of motion of all joints.   Neurologic:  alert & oriented X3 and gait normal.   Skin:  Intact without suspicious lesions or rashes Cervical Nodes:  No lymphadenopathy noted Axillary Nodes:  No palpable lymphadenopathy Psych:  Cognition and judgment appear intact. Alert and cooperative with normal attention span and concentration. No apparent delusions, illusions, hallucinations

## 2018-08-20 ENCOUNTER — Ambulatory Visit (INDEPENDENT_AMBULATORY_CARE_PROVIDER_SITE_OTHER): Payer: 59 | Admitting: Family Medicine

## 2018-08-20 ENCOUNTER — Encounter: Payer: Self-pay | Admitting: Family Medicine

## 2018-08-20 ENCOUNTER — Telehealth: Payer: Self-pay

## 2018-08-20 VITALS — BP 124/74 | HR 78 | Temp 98.5°F | Ht 66.0 in | Wt 199.8 lb

## 2018-08-20 DIAGNOSIS — Z1239 Encounter for other screening for malignant neoplasm of breast: Secondary | ICD-10-CM | POA: Diagnosis not present

## 2018-08-20 DIAGNOSIS — E785 Hyperlipidemia, unspecified: Secondary | ICD-10-CM

## 2018-08-20 DIAGNOSIS — E559 Vitamin D deficiency, unspecified: Secondary | ICD-10-CM

## 2018-08-20 DIAGNOSIS — N6320 Unspecified lump in the left breast, unspecified quadrant: Secondary | ICD-10-CM | POA: Diagnosis not present

## 2018-08-20 DIAGNOSIS — Z0000 Encounter for general adult medical examination without abnormal findings: Secondary | ICD-10-CM | POA: Diagnosis not present

## 2018-08-20 DIAGNOSIS — R739 Hyperglycemia, unspecified: Secondary | ICD-10-CM

## 2018-08-20 DIAGNOSIS — Z01419 Encounter for gynecological examination (general) (routine) without abnormal findings: Secondary | ICD-10-CM | POA: Diagnosis not present

## 2018-08-20 LAB — LIPID PANEL
Cholesterol: 195 mg/dL (ref 0–200)
HDL: 53.8 mg/dL (ref >39.00)
LDL Cholesterol: 126 mg/dL — ABNORMAL HIGH (ref 0–99)
NonHDL: 141.33
Total CHOL/HDL Ratio: 4
Triglycerides: 77 mg/dL (ref 0.0–149.0)
VLDL: 15.4 mg/dL (ref 0.0–40.0)

## 2018-08-20 LAB — COMPREHENSIVE METABOLIC PANEL
ALT: 15 U/L (ref 0–35)
AST: 18 U/L (ref 0–37)
Albumin: 4.7 g/dL (ref 3.5–5.2)
Alkaline Phosphatase: 71 U/L (ref 39–117)
BUN: 12 mg/dL (ref 6–23)
CO2: 26 mEq/L (ref 19–32)
Calcium: 9.3 mg/dL (ref 8.4–10.5)
Chloride: 103 mEq/L (ref 96–112)
Creatinine, Ser: 0.78 mg/dL (ref 0.40–1.20)
GFR: 82.02 mL/min (ref >60.00)
Glucose, Bld: 102 mg/dL — ABNORMAL HIGH (ref 70–99)
Potassium: 3.8 mEq/L (ref 3.5–5.1)
Sodium: 138 mEq/L (ref 135–145)
Total Bilirubin: 0.4 mg/dL (ref 0.2–1.2)
Total Protein: 7.4 g/dL (ref 6.0–8.3)

## 2018-08-20 LAB — CBC WITH DIFFERENTIAL/PLATELET
Basophils Absolute: 0.1 10*3/uL (ref 0.0–0.1)
Basophils Relative: 1 % (ref 0.0–3.0)
Eosinophils Absolute: 0 10*3/uL (ref 0.0–0.7)
Eosinophils Relative: 0.6 % (ref 0.0–5.0)
HCT: 39.1 % (ref 36.0–46.0)
Hemoglobin: 13.3 g/dL (ref 12.0–15.0)
Lymphocytes Relative: 24.5 % (ref 12.0–46.0)
Lymphs Abs: 1.4 10*3/uL (ref 0.7–4.0)
MCHC: 34 g/dL (ref 30.0–36.0)
MCV: 88.9 fl (ref 78.0–100.0)
Monocytes Absolute: 0.3 10*3/uL (ref 0.1–1.0)
Monocytes Relative: 6.2 % (ref 3.0–12.0)
Neutro Abs: 3.8 10*3/uL (ref 1.4–7.7)
Neutrophils Relative %: 67.7 % (ref 43.0–77.0)
Platelets: 232 10*3/uL (ref 150.0–400.0)
RBC: 4.4 Mil/uL (ref 3.87–5.11)
RDW: 13.2 % (ref 11.5–15.5)
WBC: 5.6 10*3/uL (ref 4.0–10.5)

## 2018-08-20 LAB — HEMOGLOBIN A1C: Hgb A1c MFr Bld: 5.3 % (ref 4.6–6.5)

## 2018-08-20 LAB — TSH: TSH: 2.05 u[IU]/mL (ref 0.35–4.50)

## 2018-08-20 LAB — VITAMIN D 25 HYDROXY (VIT D DEFICIENCY, FRACTURES): VITD: 42.95 ng/mL (ref 30.00–100.00)

## 2018-08-20 NOTE — Telephone Encounter (Signed)
Questions for Screening COVID-19  Symptom onset:n/a  Travel or Contacts: no  During this illness, did/does the patient experience any of the following symptoms? Fever >100.28F []   Yes [x]   No []   Unknown Subjective fever (felt feverish) []   Yes [x]   No []   Unknown Chills []   Yes [x]   No []   Unknown Muscle aches (myalgia) []   Yes [x]   No []   Unknown Runny nose (rhinorrhea) []   Yes [x]   No []   Unknown Sore throat []   Yes [x]   No []   Unknown Cough (new onset or worsening of chronic cough) []   Yes [x]   No []   Unknown Shortness of breath (dyspnea) []   Yes [x]   No []   Unknown Nausea or vomiting []   Yes [x]   No []   Unknown Headache []   Yes [x]   No []   Unknown Abdominal pain  []   Yes [x]   No []   Unknown Diarrhea (?3 loose/looser than normal stools/24hr period) []   Yes [x]   No []   Unknown Other, specify:  Patient risk factors: Smoker? []   Current []   Former []   Never If female, currently pregnant? []   Yes []   No  Patient Active Problem List   Diagnosis Date Noted  . Thrush 02/08/2018  . 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 Breast cancer screening, high risk patient 03/02/2016  . Family history of ovarian cancer   . Well woman exam 03/03/2014  . BRCA1 positive 03/05/2012    Plan:  []   High risk for COVID-19 with red flags go to ED (with CP, SOB, weak/lightheaded, or fever > 101.5). Call ahead.  []   High risk for COVID-19 but stable. Inform provider and coordinate time for [facility] visit.   []   No red flags but URI signs or symptoms okay for [facility] visit.

## 2018-08-20 NOTE — Patient Instructions (Signed)
Great to see you. I will call you with your lab results from today and you can view them online.   

## 2018-08-20 NOTE — Assessment & Plan Note (Signed)
Negative path, followed by [facility] with q 6 month MRIs.

## 2018-08-20 NOTE — Assessment & Plan Note (Addendum)
Reviewed preventive care protocols, scheduled due services, and updated immunizations Discussed nutrition, exercise, diet, and healthy lifestyle.  She has an appointment with nutritionist due to her weight.

## 2018-09-19 ENCOUNTER — Encounter: Payer: 59 | Attending: Family Medicine | Admitting: Registered"

## 2018-09-19 ENCOUNTER — Encounter: Payer: Self-pay | Admitting: Registered"

## 2018-09-19 ENCOUNTER — Other Ambulatory Visit: Payer: Self-pay

## 2018-09-19 DIAGNOSIS — Z713 Dietary counseling and surveillance: Secondary | ICD-10-CM | POA: Insufficient documentation

## 2018-09-19 NOTE — Progress Notes (Signed)
[employer] Employee Visit 1 of 3  Appt start time: 1130 end time:  1215.   Assessment:  Primary concerns today: Health insurance requirement.  Employee states she found the visits informative last year and would like to continue learning about healthy eating.  Employee was also interested in learning more about GMO and organic foods.  Sleeping: 2 months ago switched to day shift. 7-8 hrs hours. Sleep has improved since last year, employee states she feels better now that she is able to get into more of a routine.  Physical activity: Was 3-4x/week walking 3 miles with spouse until schedule changed. Employee states she is better at doing physical activity if she can do it with someone and was really enjoying the frequent walks with her husband. Employee states her dog used to have more energy, but now gets tired more easily so that does not giver her as much exercise. Employee had a friend she used to walk with but her friend's children's needs make it difficult to plan walks with her. Employee states she is trying to figure out how to walk more with her husband again.  MEDICATIONS: reviewed   DIETARY INTAKE:  Avoided foods include pork.    24-hr recall:  B ( AM): scrambled eggs, toast w applebutter OR breakfast burritos  Snk ( AM): none  L ( PM): [employer] tomato soup with [place] sandwich, [employer] Snk ( PM): [place] yogurt OR cinnamon roll D ( PM): chicken, salmon 1x week, tilapia with roasted broccoli or shredded cabbage, 2x/month less red meat OR chicken lo mein, rice OR homemade tortilla chicken quesadilla  Snk ( PM): small rice crispy treat (45 cal.) OR frozen bar OR fruit Beverages: water, coffee, cherry coke zero  Usual physical activity: sporadic  Progress Towards Goal(s):  New goals.     Intervention:  Nutrition education. Discussed ways to include more vegetables and physical activity  Plan: (for next 2 weeks) Continue working on getting back into a physical activity  routine. Walking with [place], 2x week and one [facility] trail Include more veggie in your diet, start with carrots & hummus Continue eating nuts, great protein, healthy fat and fiber Clean 15 and dirty dozen if you are interested in pesticide potential on foods. Think up another good nutrition question for next visit  Handouts given during visit include:  none   Barriers to learning/adherence to lifestyle change: none  Demonstrated degree of understanding via:  Teach Back   Monitoring/Evaluation:  Dietary intake, exercise, and body weight in 2 week(s).

## 2018-09-19 NOTE — Patient Instructions (Addendum)
Plan: Continue working on getting back into a physical activity routine. Walking with [place], 2x week and one [facility] trail Include more veggie in your diet, start with carrots & hummus Continue eating nuts, great protein, healthy fat and fiber Clean 15 and dirty dozen if you are interested in pesticide potential on foods. Think up another good nutrition question for next visit

## 2018-10-03 ENCOUNTER — Encounter: Payer: 59 | Attending: Family Medicine | Admitting: Registered"

## 2018-10-03 ENCOUNTER — Encounter: Payer: Self-pay | Admitting: Registered"

## 2018-10-03 ENCOUNTER — Other Ambulatory Visit: Payer: Self-pay

## 2018-10-03 DIAGNOSIS — Z713 Dietary counseling and surveillance: Secondary | ICD-10-CM | POA: Insufficient documentation

## 2018-10-03 NOTE — Patient Instructions (Signed)
Continue with your plan to teach your son cooking skill. Continue with your increased excerise.

## 2018-10-03 NOTE — Progress Notes (Signed)
[facility] Employee Visit 2 of 3  Appt start time:  [id number] end time:  [id number].   Assessment:  Primary concerns today: Health insurance requirement.  Employee had question about [employer] recommended shakes for life to maintain weight loss.   Employee had question about estrogen and effect on weight. D/t having ovaries removed has estrogen replacement therapy, was low doese, now medium patch.  Physical activity:   MEDICATIONS: reviewed   DIETARY INTAKE:  Avoided foods include pork.    24-hr recall: not assessed B ( AM):  Snk ( AM): none  L ( PM):  Snk ( PM):  D ( PM):  Snk ( PM):  Beverages: water, coffee, cherry coke zero 1x week  Usual physical activity: 2x week (from 0-1x/week)  Progress Towards Goal(s):  In progress     Intervention:  Nutrition education. Discussed ways to include more vegetables and physical activity  Goal progress: Continue working on getting back into a physical activity routine. Started 2x week. Walking with [place], 2x week and one [facility] trail (didn't make it) New goal: increase number of home cooked meals and teaching son how to cook.  Handouts given during visit include:  none   Barriers to learning/adherence to lifestyle change: none  Demonstrated degree of understanding via:  Teach Back   Monitoring/Evaluation:  Dietary intake, exercise, and body weight in 4 week(s).

## 2018-10-04 DIAGNOSIS — Z1509 Genetic susceptibility to other malignant neoplasm: Secondary | ICD-10-CM | POA: Diagnosis not present

## 2018-10-04 DIAGNOSIS — Z1379 Encounter for other screening for genetic and chromosomal anomalies: Secondary | ICD-10-CM | POA: Diagnosis not present

## 2018-10-04 DIAGNOSIS — Z1501 Genetic susceptibility to malignant neoplasm of breast: Secondary | ICD-10-CM | POA: Diagnosis not present

## 2018-10-04 DIAGNOSIS — Z803 Family history of malignant neoplasm of breast: Secondary | ICD-10-CM | POA: Diagnosis not present

## 2018-10-04 DIAGNOSIS — Z1239 Encounter for other screening for malignant neoplasm of breast: Secondary | ICD-10-CM | POA: Diagnosis not present

## 2018-10-04 DIAGNOSIS — Z1231 Encounter for screening mammogram for malignant neoplasm of breast: Secondary | ICD-10-CM | POA: Diagnosis not present

## 2018-10-09 DIAGNOSIS — L739 Follicular disorder, unspecified: Secondary | ICD-10-CM | POA: Diagnosis not present

## 2018-10-09 DIAGNOSIS — D229 Melanocytic nevi, unspecified: Secondary | ICD-10-CM | POA: Diagnosis not present

## 2018-10-09 DIAGNOSIS — L814 Other melanin hyperpigmentation: Secondary | ICD-10-CM | POA: Diagnosis not present

## 2018-10-09 DIAGNOSIS — D239 Other benign neoplasm of skin, unspecified: Secondary | ICD-10-CM | POA: Diagnosis not present

## 2018-10-22 DIAGNOSIS — H52203 Unspecified astigmatism, bilateral: Secondary | ICD-10-CM | POA: Diagnosis not present

## 2018-10-22 DIAGNOSIS — Z135 Encounter for screening for eye and ear disorders: Secondary | ICD-10-CM | POA: Diagnosis not present

## 2019-01-07 ENCOUNTER — Other Ambulatory Visit: Payer: Self-pay

## 2019-01-07 DIAGNOSIS — Z20822 Contact with and (suspected) exposure to covid-19: Secondary | ICD-10-CM

## 2019-04-08 DIAGNOSIS — Z1239 Encounter for other screening for malignant neoplasm of breast: Secondary | ICD-10-CM | POA: Diagnosis not present

## 2019-04-08 DIAGNOSIS — Z1509 Genetic susceptibility to other malignant neoplasm: Secondary | ICD-10-CM | POA: Diagnosis not present

## 2019-04-08 DIAGNOSIS — Z1501 Genetic susceptibility to malignant neoplasm of breast: Secondary | ICD-10-CM | POA: Diagnosis not present

## 2019-04-08 DIAGNOSIS — Z803 Family history of malignant neoplasm of breast: Secondary | ICD-10-CM | POA: Diagnosis not present

## 2019-05-31 ENCOUNTER — Ambulatory Visit: Payer: 59

## 2019-06-24 ENCOUNTER — Ambulatory Visit: Payer: 59 | Attending: Internal Medicine

## 2019-06-24 DIAGNOSIS — Z23 Encounter for immunization: Secondary | ICD-10-CM

## 2019-06-24 NOTE — Progress Notes (Signed)
   Covid-19 Vaccination Clinic  Name:  [name]    MRN: [id number] DOB: 02-21-1980  06/24/2019  [name] was observed post Covid-19 immunization for 15 minutes without incident. She was provided with Vaccine Information Sheet and instruction to access the V-Safe system.   [name] was instructed to call 911 with any severe reactions post vaccine: Marland Kitchen Difficulty breathing  . Swelling of face and throat  . A fast heartbeat  . A bad rash all over body  . Dizziness and weakness   Immunizations Administered    Name Date Dose VIS Date Route   Pfizer COVID-19 Vaccine 06/24/2019  5:09 PM 0.3 mL 03/27/2018 Intramuscular   Manufacturer: [place]   Lot: [id number]   [place]: [id number]

## 2019-08-22 MED FILL — AMOXICILLIN 500 MG CAPSULE: 500 | 7 days supply | Qty: 14 | Fill #0

## 2019-10-10 DIAGNOSIS — Z1231 Encounter for screening mammogram for malignant neoplasm of breast: Secondary | ICD-10-CM | POA: Diagnosis not present

## 2019-10-10 DIAGNOSIS — Z1379 Encounter for other screening for genetic and chromosomal anomalies: Secondary | ICD-10-CM | POA: Diagnosis not present

## 2019-10-10 DIAGNOSIS — Z1501 Genetic susceptibility to malignant neoplasm of breast: Secondary | ICD-10-CM | POA: Diagnosis not present

## 2019-10-10 DIAGNOSIS — Z803 Family history of malignant neoplasm of breast: Secondary | ICD-10-CM | POA: Diagnosis not present

## 2019-10-10 DIAGNOSIS — Z1509 Genetic susceptibility to other malignant neoplasm: Secondary | ICD-10-CM | POA: Diagnosis not present

## 2019-10-10 DIAGNOSIS — Z1239 Encounter for other screening for malignant neoplasm of breast: Secondary | ICD-10-CM | POA: Diagnosis not present

## 2019-11-05 ENCOUNTER — Ambulatory Visit: Payer: 59 | Admitting: Family Medicine

## 2019-11-05 ENCOUNTER — Encounter: Payer: Self-pay | Admitting: Family Medicine

## 2019-11-05 ENCOUNTER — Other Ambulatory Visit: Payer: Self-pay

## 2019-11-05 DIAGNOSIS — Z1509 Genetic susceptibility to other malignant neoplasm: Secondary | ICD-10-CM | POA: Diagnosis not present

## 2019-11-05 DIAGNOSIS — E785 Hyperlipidemia, unspecified: Secondary | ICD-10-CM | POA: Insufficient documentation

## 2019-11-05 DIAGNOSIS — E782 Mixed hyperlipidemia: Secondary | ICD-10-CM | POA: Diagnosis not present

## 2019-11-05 DIAGNOSIS — Z1501 Genetic susceptibility to malignant neoplasm of breast: Secondary | ICD-10-CM

## 2019-11-05 DIAGNOSIS — E8940 Asymptomatic postprocedural ovarian failure: Secondary | ICD-10-CM | POA: Diagnosis not present

## 2019-11-05 NOTE — Progress Notes (Signed)
Annual Exam   Chief Complaint:  Chief Complaint  Patient presents with  . Establish Care    no concerns     History of Present Illness:  [name] is a 39 y.o. [phone] who LMP was Patient's last menstrual period was 05/31/2010., presents today for her annual examination.     Nutrition Exercise: nothing regular - but on occasion Diet: ok, could be better She does get adequate calcium and Vitamin D in her diet.   Social History   Tobacco Use  Smoking Status Never Smoker  Smokeless Tobacco Never Used   Social History   Substance and Sexual Activity  Alcohol Use Yes  . Alcohol/week: 0.0 standard drinks   Comment: once a month   Social History   Substance and Sexual Activity  Drug Use No    Safety The patient wears seatbelts: yes.     The patient feels safe at home and in their relationships: yes.  General Health Dentist in the last year: Yes Eye doctor: yes  Menstrual Surgical menopause On estrogen replacement  GYN She is single partner, contraception - status post hysterectomy.    Cervical Cancer Screening:   Hysterectomy with cervix removed in 2012   Breast Cancer Screening There is FH of breast cancer. There is FH of ovarian cancer. BRCA screening - she has BRCA gene positive.  Discussed that for average risk women between age 65-49 screening may reduce the risk of breast cancer death, however, at a lower rate than those over age 28. And that the the false-positive rates resulting in unnecessary biopsies with more screening is higher. The balance of benefits vs harms likely improves as you progress through your 40s. The patient does want a mammogram this year.   -- follows with high risk clinic and screened twice a year with mammogram and [place]   Weight Wt Readings from Last 3 Encounters:  11/05/19 194 lb 8 oz (88.2 kg)  08/20/18 199 lb 12.8 oz (90.6 kg)  02/08/18 200 lb (90.7 kg)   Patient has high BMI  BMI Readings from Last 1  Encounters:  11/05/19 30.92 kg/m     Chronic disease screening Blood pressure monitoring:  BP Readings from Last 3 Encounters:  11/05/19 110/78  08/20/18 124/74  02/08/18 106/68    Lipid Monitoring: Indication for screening: age >24, obesity, diabetes, family hx, CV risk factors.  Lipid screening: Yes  Lab Results  Component Value Date   CHOL 195 08/20/2018   HDL 53.80 08/20/2018   LDLCALC 126 (H) 08/20/2018   TRIG 77.0 08/20/2018   CHOLHDL 4 08/20/2018     Diabetes Screening: age >26, overweight, family hx, PCOS, hx of gestational diabetes, at risk ethnicity Diabetes Screening screening: Not Indicated  Lab Results  Component Value Date   HGBA1C 5.3 08/20/2018     Past Medical History:  Diagnosis Date  . BRCA1 positive   . Cancer [facility])    hydatidiform mole  . Family history of ovarian cancer   . 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 Sore throat    recent 10/17/2014 to see PCP on 10/17/14     Past Surgical History:  Procedure Laterality Date  . ABDOMINAL HYSTERECTOMY  12/17/2010  . CESAREAN SECTION  2004, 2006  . DILATION AND CURETTAGE OF UTERUS    . DILATION AND EVACUATION  08/12/2010   Procedure: DILATATION AND EVACUATION (D&E);  Surgeon: Melina Schools, MD;  Location: [facility];  Service: Gynecology;  Laterality: N/A;  .  LAPAROSCOPIC OOPHERECTOMY Bilateral 10/22/2014   Procedure: BILATERAL LAPAROSCOPIC OOPHERECTOMY;  Surgeon: Nancy Marus, MD;  Location: [facility];  Service: Gynecology;  Laterality: Bilateral;  . LASIK Bilateral 03/02/2017    Prior to Admission medications   Medication Sig Start Date End Date Taking? Authorizing Provider  Cholecalciferol (VITAMIN D) 2000 units tablet Take 2,000 Units by mouth daily.   Yes [provider]  estradiol (CLIMARA - DOSED IN MG/24 HR) 0.0375 mg/24hr patch Place 0.0375 mg onto the skin once a week. 07/13/19  Yes [provider]  ibuprofen (ADVIL,MOTRIN) 200 MG tablet Take 400 mg  by mouth daily as needed. For pain.    Yes [provider]  Multiple Vitamins-Minerals (MULTIVITAMIN PO) Take 1 tablet by mouth daily.   Yes [provider]  Red Yeast Rice Extract (RED YEAST RICE PO) Take by mouth daily.   Yes [provider]    Allergies  Allergen Reactions  . Gadobenate Hives    Gynecologic History: Patient's last menstrual period was 05/31/2010.  Obstetric History: G4P2010  Social History   Socioeconomic History  . Marital status: Married    Spouse name: Quillian Quince  . Number of children: 2  . Years of education: bachelors  . Highest education level: Not on file  Occupational History  . Not on file  Tobacco Use  . Smoking status: Never Smoker  . Smokeless tobacco: Never Used  Vaping Use  . Vaping Use: Never used  Substance and Sexual Activity  . Alcohol use: Yes    Alcohol/week: 0.0 standard drinks    Comment: once a month  . Drug use: No  . Sexual activity: Yes    Birth control/protection: Surgical  Other Topics Concern  . 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 Difficulty of Paying Living Expenses: Not on file  Food Insecurity:   . Worried About Charity fundraiser in the Last Year: Not on file  . Ran Out of Food in the Last Year: Not on file  Transportation Needs:   . Lack of Transportation (Medical): Not on file  . Lack of Transportation (Non-Medical): Not on file  Physical Activity:   . Days of Exercise per Week: Not on file  . Minutes of Exercise per  Session: Not on file  Stress:   . Feeling of Stress : Not on file  Social Connections:   . Frequency of Communication with Friends and Family: Not on file  . Frequency of Social Gatherings with Friends and Family: Not on file  . Attends Religious Services: Not on file  . Active Member of Clubs or Organizations: Not on file  . Attends Archivist Meetings: Not on file  . Marital Status: Not on file  Intimate Partner Violence:   . Fear of Current or Ex-Partner: Not on file  . Emotionally Abused: Not on file  .  Physically Abused: Not on file  . Sexually Abused: Not on file    Family History  Problem Relation Age of Onset  . Ovarian cancer Mother 60  . BRCA 1/2 Mother        BRCA1 mutation  . Ovarian cancer Maternal Aunt 72  . Ovarian cancer Maternal Aunt 60  . Breast cancer Cousin 37  . Cancer Other        "abdominal ca" in 3 sisters of mat grandfather  . Diabetes Maternal Grandfather   . Diabetes Paternal Grandmother   . CAD Paternal Grandmother     Review of Systems  Constitutional: Negative for chills and fever.  HENT: Negative for congestion and sore throat.   Eyes: Negative for blurred vision and double vision.  Respiratory: Negative for shortness of breath.   Cardiovascular: Negative for chest pain.  Gastrointestinal: Negative for heartburn, nausea and vomiting.  Genitourinary: Negative.   Musculoskeletal: Negative.  Negative for myalgias.  Skin: Negative for rash.  Neurological: Negative for dizziness and headaches.  Endo/Heme/Allergies: Does not bruise/bleed easily.  Psychiatric/Behavioral: Negative for depression. The patient is not nervous/anxious.      Physical Exam BP 110/78   Pulse 73   Temp 97.6 F (36.4 C) (Temporal)   Ht 5' 6.5" (1.689 m)   Wt 194 lb 8 oz (88.2 kg)   LMP 05/31/2010   SpO2 98%   BMI 30.92 kg/m    BP Readings from Last 3 Encounters:  11/05/19 110/78  08/20/18 124/74  02/08/18 106/68      Physical  Exam Constitutional:      General: She is not in acute distress.    Appearance: She is well-developed. She is not diaphoretic.  HENT:     Head: Normocephalic and atraumatic.     Right Ear: External ear normal.     Left Ear: External ear normal.     Nose: Nose normal.  Eyes:     General: No scleral icterus.    Conjunctiva/sclera: Conjunctivae normal.  Cardiovascular:     Rate and Rhythm: Normal rate and regular rhythm.     Heart sounds: No murmur heard.   Pulmonary:     Effort: Pulmonary effort is normal. No respiratory distress.     Breath sounds: Normal breath sounds. No wheezing.  Abdominal:     General: Bowel sounds are normal. There is no distension.     Palpations: Abdomen is soft. There is no mass.     Tenderness: There is no abdominal tenderness. There is no guarding or rebound.  Musculoskeletal:        General: Normal range of motion.     Cervical back: Neck supple.  Lymphadenopathy:     Cervical: No cervical adenopathy.  Skin:    General: Skin is warm and dry.     Capillary Refill: Capillary refill takes less than 2 seconds.  Neurological:     Mental Status: She is alert and oriented to person, place, and time.     Deep Tendon Reflexes: Reflexes normal.  Psychiatric:        Behavior: Behavior normal.      Results:  PHQ-9:   Depression screen [facility] 2/9 11/05/2019 09/19/2018 08/30/2017  Decreased Interest 0 0 0  Down, Depressed, Hopeless 0 0 0  PHQ - 2 Score 0 0 0  Some recent data might be hidden       Assessment: 39 y.o. G91P2010 female here for routine annual physical examination.  Plan: Problem List Items Addressed This Visit  Endocrine   Premature surgical menopause     Other   BRCA1 positive   Hyperlipidemia      Screening: -- Blood pressure screen normal -- cholesterol screening: not due for screening -- Weight screening: overweight: continue to monitor -- Diabetes Screening: not due for screening -- Nutrition: Encouraged healthy  diet  The 10-year ASCVD risk score Mikey Bussing DC Jr., et al., 2013) is: 0.4%   Values used to calculate the score:     Age: 6 years     Sex: Female     Is Non-Hispanic African American: No     Diabetic: No     Tobacco smoker: No     Systolic Blood Pressure: 301 mmHg     Is BP treated: No     HDL Cholesterol: 53.8 mg/dL     Total Cholesterol: 195 mg/dL  -- Statin therapy for Age 57-75 with CVD risk >7.5%  Psych -- Depression screening (PHQ-9):   Depression screen [facility] 2/9 11/05/2019 09/19/2018 08/30/2017  Decreased Interest 0 0 0  Down, Depressed, Hopeless 0 0 0  PHQ - 2 Score 0 0 0  Some recent data might be hidden      Safety -- tobacco screening: not using -- alcohol screening:  low-risk usage. -- no evidence of domestic violence or intimate partner violence.   Cancer Screening -- pap smear not collected per ASCCP guidelines - s/p hysterectomy  -- family history of breast cancer screening: BRCA positive and follows with speciality high risk clinic -- Mammogram - up to date -- Colon cancer (age 33+)-- n/a  Immunizations Immunization History  Administered Date(s) Administered  . Influenza Split 10/19/2014  . Influenza, Quadrivalent, Recombinant, Inj, Pf 10/20/2016  . Influenza,inj,Quad PF,6+ Mos 10/25/2019  . Influenza-Unspecified 11/01/2015, 10/15/2017  . PFIZER SARS-COV-2 Vaccination 05/31/2019, 06/24/2019    -- flu vaccine up to date -- TDAP q10 years unknown, record requested -- Covid-19 Vaccine up to date   Encouraged healthy diet and exercise. Encouraged regular vision and dental care.   Lesleigh Noe, MD

## 2019-11-05 NOTE — Patient Instructions (Signed)
Preventive Care 40-40 Years Old, Female Preventive care refers to visits with your health care provider and lifestyle choices that can promote health and wellness. This includes:  A yearly physical exam. This may also be called an annual well check.  Regular dental visits and eye exams.  Immunizations.  Screening for certain conditions.  Healthy lifestyle choices, such as eating a healthy diet, getting regular exercise, not using drugs or products that contain nicotine and tobacco, and limiting alcohol use. What can I expect for my preventive care visit? Physical exam Your health care provider will check your:  Height and weight. This may be used to calculate body mass index (BMI), which tells if you are at a healthy weight.  Heart rate and blood pressure.  Skin for abnormal spots. Counseling Your health care provider may ask you questions about your:  Alcohol, tobacco, and drug use.  Emotional well-being.  Home and relationship well-being.  Sexual activity.  Eating habits.  Work and work Statistician.  Method of birth control.  Menstrual cycle.  Pregnancy history. What immunizations do I need?  Influenza (flu) vaccine  This is recommended every year. Tetanus, diphtheria, and pertussis (Tdap) vaccine  You may need a Td booster every 10 years. Varicella (chickenpox) vaccine  You may need this if you have not been vaccinated. Zoster (shingles) vaccine  You may need this after age 16. Measles, mumps, and rubella (MMR) vaccine  You may need at least one dose of MMR if you were born in 1957 or later. You may also need a second dose. Pneumococcal conjugate (PCV13) vaccine  You may need this if you have certain conditions and were not previously vaccinated. Pneumococcal polysaccharide (PPSV23) vaccine  You may need one or two doses if you smoke cigarettes or if you have certain conditions. Meningococcal conjugate (MenACWY) vaccine  You may need this if you  have certain conditions. Hepatitis A vaccine  You may need this if you have certain conditions or if you travel or work in places where you may be exposed to hepatitis A. Hepatitis B vaccine  You may need this if you have certain conditions or if you travel or work in places where you may be exposed to hepatitis B. Haemophilus influenzae type b (Hib) vaccine  You may need this if you have certain conditions. Human papillomavirus (HPV) vaccine  If recommended by your health care provider, you may need three doses over 6 months. You may receive vaccines as individual doses or as more than one vaccine together in one shot (combination vaccines). Talk with your health care provider about the risks and benefits of combination vaccines. What tests do I need? Blood tests  Lipid and cholesterol levels. These may be checked every 5 years, or more frequently if you are over 40 years old.  Hepatitis C test.  Hepatitis B test. Screening  Lung cancer screening. You may have this screening every year starting at age 40 if you have a 30-pack-year history of smoking and currently smoke or have quit within the past 15 years.  Colorectal cancer screening. All adults should have this screening starting at age 40 and continuing until age 40. Your health care provider may recommend screening at age 40 if you are at increased risk. You will have tests every 1-10 years, depending on your results and the type of screening test.  Diabetes screening. This is done by checking your blood sugar (glucose) after you have not eaten for a while (fasting). You may have this  done every 1-3 years.  Mammogram. This may be done every 1-2 years. Talk with your health care provider about when you should start having regular mammograms. This may depend on whether you have a family history of breast cancer.  BRCA-related cancer screening. This may be done if you have a family history of breast, ovarian, tubal, or peritoneal  cancers.  Pelvic exam and Pap test. This may be done every 3 years starting at age 40. Starting at age 40, this may be done every 5 years if you have a Pap test in combination with an HPV test. Other tests  Sexually transmitted disease (STD) testing.  Bone density scan. This is done to screen for osteoporosis. You may have this scan if you are at high risk for osteoporosis. Follow these instructions at home: Eating and drinking  Eat a diet that includes fresh fruits and vegetables, whole grains, lean protein, and low-fat dairy.  Take vitamin and mineral supplements as recommended by your health care provider.  Do not drink alcohol if: ? Your health care provider tells you not to drink. ? You are pregnant, may be pregnant, or are planning to become pregnant.  If you drink alcohol: ? Limit how much you have to 0-1 drink a day. ? Be aware of how much alcohol is in your drink. In the [place] one drink equals one 12 oz bottle of beer (355 mL), one 5 oz glass of wine (148 mL), or one 1 oz glass of hard liquor (44 mL). Lifestyle  Take daily care of your teeth and gums.  Stay active. Exercise for at least 30 minutes on 5 or more days each week.  Do not use any products that contain nicotine or tobacco, such as cigarettes, e-cigarettes, and chewing tobacco. If you need help quitting, ask your health care provider.  If you are sexually active, practice safe sex. Use a condom or other form of birth control (contraception) in order to prevent pregnancy and STIs (sexually transmitted infections).  If told by your health care provider, take low-dose aspirin daily starting at age 40. What's next?  Visit your health care provider once a year for a well check visit.  Ask your health care provider how often you should have your eyes and teeth checked.  Stay up to date on all vaccines. This information is not intended to replace advice given to you by your health care provider. Make sure you  discuss any questions you have with your health care provider. Document Revised: 09/28/2017 Document Reviewed: 09/28/2017 Elsevier Patient Education  2020 [employer].

## 2020-02-21 DIAGNOSIS — Z135 Encounter for screening for eye and ear disorders: Secondary | ICD-10-CM | POA: Diagnosis not present

## 2020-02-21 DIAGNOSIS — H52203 Unspecified astigmatism, bilateral: Secondary | ICD-10-CM | POA: Diagnosis not present

## 2020-04-06 DIAGNOSIS — N6459 Other signs and symptoms in breast: Secondary | ICD-10-CM | POA: Diagnosis not present

## 2020-04-06 DIAGNOSIS — Z1509 Genetic susceptibility to other malignant neoplasm: Secondary | ICD-10-CM | POA: Diagnosis not present

## 2020-04-06 DIAGNOSIS — Z803 Family history of malignant neoplasm of breast: Secondary | ICD-10-CM | POA: Diagnosis not present

## 2020-04-06 DIAGNOSIS — Z1239 Encounter for other screening for malignant neoplasm of breast: Secondary | ICD-10-CM | POA: Diagnosis not present

## 2020-04-06 DIAGNOSIS — Z1501 Genetic susceptibility to malignant neoplasm of breast: Secondary | ICD-10-CM | POA: Diagnosis not present

## 2020-06-09 ENCOUNTER — Other Ambulatory Visit: Payer: Self-pay

## 2020-06-09 ENCOUNTER — Encounter: Payer: Self-pay | Admitting: Family Medicine

## 2020-06-09 ENCOUNTER — Ambulatory Visit (INDEPENDENT_AMBULATORY_CARE_PROVIDER_SITE_OTHER): Payer: 59 | Admitting: Family Medicine

## 2020-06-09 VITALS — BP 98/60 | HR 76 | Temp 97.8°F | Ht 65.75 in | Wt 203.5 lb

## 2020-06-09 DIAGNOSIS — Z1501 Genetic susceptibility to malignant neoplasm of breast: Secondary | ICD-10-CM | POA: Diagnosis not present

## 2020-06-09 DIAGNOSIS — Z1509 Genetic susceptibility to other malignant neoplasm: Secondary | ICD-10-CM | POA: Diagnosis not present

## 2020-06-09 DIAGNOSIS — Z0000 Encounter for general adult medical examination without abnormal findings: Secondary | ICD-10-CM | POA: Diagnosis not present

## 2020-06-09 NOTE — Patient Instructions (Signed)
Preventive Care 41-41 Years Old, Female Preventive care refers to lifestyle choices and visits with your health care provider that can promote health and wellness. This includes:  A yearly physical exam. This is also called an annual wellness visit.  Regular dental and eye exams.  Immunizations.  Screening for certain conditions.  Healthy lifestyle choices, such as: ? Eating a healthy diet. ? Getting regular exercise. ? Not using drugs or products that contain nicotine and tobacco. ? Limiting alcohol use. What can I expect for my preventive care visit? Physical exam Your health care provider will check your:  Height and weight. These may be used to calculate your BMI (body mass index). BMI is a measurement that tells if you are at a healthy weight.  Heart rate and blood pressure.  Body temperature.  Skin for abnormal spots. Counseling Your health care provider may ask you questions about your:  Past medical problems.  Family's medical history.  Alcohol, tobacco, and drug use.  Emotional well-being.  Home life and relationship well-being.  Sexual activity.  Diet, exercise, and sleep habits.  Work and work Statistician.  Access to firearms.  Method of birth control.  Menstrual cycle.  Pregnancy history. What immunizations do I need? Vaccines are usually given at various ages, according to a schedule. Your health care provider will recommend vaccines for you based on your age, medical history, and lifestyle or other factors, such as travel or where you work.   What tests do I need? Blood tests  Lipid and cholesterol levels. These may be checked every 5 years, or more often if you are over 3 years old.  Hepatitis C test.  Hepatitis B test. Screening  Lung cancer screening. You may have this screening every year starting at age 73 if you have a 30-pack-year history of smoking and currently smoke or have quit within the past 15 years.  Colorectal cancer  screening. ? All adults should have this screening starting at age 52 and continuing until age 17. ? Your health care provider may recommend screening at age 49 if you are at increased risk. ? You will have tests every 1-10 years, depending on your results and the type of screening test.  Diabetes screening. ? This is done by checking your blood sugar (glucose) after you have not eaten for a while (fasting). ? You may have this done every 1-3 years.  Mammogram. ? This may be done every 1-2 years. ? Talk with your health care provider about when you should start having regular mammograms. This may depend on whether you have a family history of breast cancer.  BRCA-related cancer screening. This may be done if you have a family history of breast, ovarian, tubal, or peritoneal cancers.  Pelvic exam and Pap test. ? This may be done every 3 years starting at age 10. ? Starting at age 11, this may be done every 5 years if you have a Pap test in combination with an HPV test. Other tests  STD (sexually transmitted disease) testing, if you are at risk.  Bone density scan. This is done to screen for osteoporosis. You may have this scan if you are at high risk for osteoporosis. Talk with your health care provider about your test results, treatment options, and if necessary, the need for more tests. Follow these instructions at home: Eating and drinking  Eat a diet that includes fresh fruits and vegetables, whole grains, lean protein, and low-fat dairy products.  Take vitamin and mineral supplements  as recommended by your health care provider.  Do not drink alcohol if: ? Your health care provider tells you not to drink. ? You are pregnant, may be pregnant, or are planning to become pregnant.  If you drink alcohol: ? Limit how much you have to 0-1 drink a day. ? Be aware of how much alcohol is in your drink. In the [place] one drink equals one 12 oz bottle of beer (355 mL), one 5 oz glass of  wine (148 mL), or one 1 oz glass of hard liquor (44 mL).   Lifestyle  Take daily care of your teeth and gums. Brush your teeth every morning and night with fluoride toothpaste. Floss one time each day.  Stay active. Exercise for at least 30 minutes 5 or more days each week.  Do not use any products that contain nicotine or tobacco, such as cigarettes, e-cigarettes, and chewing tobacco. If you need help quitting, ask your health care provider.  Do not use drugs.  If you are sexually active, practice safe sex. Use a condom or other form of protection to prevent STIs (sexually transmitted infections).  If you do not wish to become pregnant, use a form of birth control. If you plan to become pregnant, see your health care provider for a prepregnancy visit.  If told by your health care provider, take low-dose aspirin daily starting at age 50.  Find healthy ways to cope with stress, such as: ? Meditation, yoga, or listening to music. ? Journaling. ? Talking to a trusted person. ? Spending time with friends and family. Safety  Always wear your seat belt while driving or riding in a vehicle.  Do not drive: ? If you have been drinking alcohol. Do not ride with someone who has been drinking. ? When you are tired or distracted. ? While texting.  Wear a helmet and other protective equipment during sports activities.  If you have firearms in your house, make sure you follow all gun safety procedures. What's next?  Visit your health care provider once a year for an annual wellness visit.  Ask your health care provider how often you should have your eyes and teeth checked.  Stay up to date on all vaccines. This information is not intended to replace advice given to you by your health care provider. Make sure you discuss any questions you have with your health care provider. Document Revised: 10/22/2019 Document Reviewed: 09/28/2017 Elsevier Patient Education  2021 [employer]vier Inc.  

## 2020-06-09 NOTE — Progress Notes (Signed)
Annual Exam   Chief Complaint:  Chief Complaint  Patient presents with  . Annual Exam    History of Present Illness:  [name] is a 40 y.o. G4P2010 who LMP was Patient's last menstrual period was 05/31/2010., presents today for her annual examination.     Nutrition Diet: not great recently Exercise: planning to start walking She does get adequate calcium and Vitamin D in her diet.   Social History   Tobacco Use  Smoking Status Never Smoker  Smokeless Tobacco Never Used   Social History   Substance and Sexual Activity  Alcohol Use Yes  . Alcohol/week: 0.0 standard drinks   Comment: once a month   Social History   Substance and Sexual Activity  Drug Use No    Safety The patient wears seatbelts: yes.     The patient feels safe at home and in their relationships: yes.  General Health Dentist in the last year: Yes Eye doctor: yes  Menstrual S/p hysterectomy and oophorectomy on estradiol   GYN She is single partner, contraception - status post hysterectomy.    Cervical Cancer Screening:   S/p hystectomy with cervix removed   Breast Cancer Screening There is FH of breast cancer. There is FH of ovarian cancer. 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  Discussed that for average risk women between age 6-49 screening may reduce the risk of breast cancer death, however, at a lower rate than those over age 54. And that the the false-positive rates resulting in unnecessary biopsies with more screening is higher. The balance of benefits vs harms likely improves as you progress through your 40s. The patient does want a mammogram this year.     Weight Wt Readings from Last 3 Encounters:  06/09/20 203 lb 8 oz (92.3 kg)  11/05/19 194 lb 8 oz (88.2 kg)  08/20/18 199 lb 12.8 oz (90.6 kg)   Patient has high BMI  BMI Readings from Last 1 Encounters:  06/09/20 33.10 kg/m     Chronic disease screening Blood pressure monitoring:  BP Readings from Last 3  Encounters:  06/09/20 98/60  11/05/19 110/78  08/20/18 124/74    Lipid Monitoring: Indication for screening: age >22, obesity, diabetes, family hx, CV risk factors.  Lipid screening: Yes  Lab Results  Component Value Date   CHOL 195 08/20/2018   HDL 53.80 08/20/2018   LDLCALC 126 (H) 08/20/2018   TRIG 77.0 08/20/2018   CHOLHDL 4 08/20/2018     Diabetes Screening: age >65, overweight, family hx, PCOS, hx of gestational diabetes, at risk ethnicity Diabetes Screening screening: Yes  Lab Results  Component Value Date   HGBA1C 5.3 08/20/2018     Past Medical History:  Diagnosis Date  . BRCA1 positive   . 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 Family history of ovarian cancer   . 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 Sore throat    recent 10/17/2014 to see PCP on 10/17/14     Past Surgical History:  Procedure Laterality Date  . ABDOMINAL HYSTERECTOMY  12/17/2010  . CESAREAN SECTION  2004, 2006  . DILATION AND CURETTAGE OF UTERUS    . DILATION AND EVACUATION  08/12/2010   Procedure: DILATATION AND EVACUATION (D&E);  Surgeon: Melina Schools, MD;  Location: [facility];  Service: Gynecology;  Laterality: N/A;  . LAPAROSCOPIC OOPHERECTOMY Bilateral 10/22/2014   Procedure: BILATERAL LAPAROSCOPIC OOPHERECTOMY;  Surgeon: Nancy Marus, MD;  Location: [facility];  Service: Gynecology;  Laterality:  Bilateral;  . LASIK Bilateral 03/02/2017    Prior to Admission medications   Medication Sig Start Date End Date Taking? Authorizing Provider  Cholecalciferol (VITAMIN D) 2000 units tablet Take 2,000 Units by mouth daily.   Yes [provider]  estradiol (CLIMARA - DOSED IN MG/24 HR) 0.0375 mg/24hr patch Place 0.0375 mg onto the skin once a week. 07/13/19  Yes [provider]  ibuprofen (ADVIL,MOTRIN) 200 MG tablet Take 400 mg by mouth daily as needed. For pain.   Yes [provider]  Multiple Vitamins-Minerals (MULTIVITAMIN PO) Take 1 tablet by  mouth daily.   Yes [provider]  Red Yeast Rice Extract (RED YEAST RICE PO) Take by mouth daily.   Yes [provider]    Allergies  Allergen Reactions  . Gadobenate Hives    Gynecologic History: Patient's last menstrual period was 05/31/2010.  Obstetric History: G4P2010  Social History   Socioeconomic History  . Marital status: Married    Spouse name: Quillian Quince  . Number of children: 2  . Years of education: bachelors  . Highest education level: Not on file  Occupational History  . Not on file  Tobacco Use  . Smoking status: Never Smoker  . Smokeless tobacco: Never Used  Vaping Use  . Vaping Use: Never used  Substance and Sexual Activity  . Alcohol use: Yes    Alcohol/week: 0.0 standard drinks    Comment: once a month  . Drug use: No  . Sexual activity: Yes    Birth control/protection: Surgical  Other Topics Concern  . 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 Ovarian cancer Mother 90  . BRCA 1/2 Mother        BRCA1 mutation  . Ovarian cancer Maternal Aunt 21  . Ovarian cancer Maternal Aunt 60  . Breast cancer Cousin 51  . Cancer Other        "abdominal ca" in 3 sisters of mat grandfather  . Diabetes Maternal Grandfather   . Diabetes  Paternal Grandmother   . CAD Paternal Grandmother     Review of Systems  Constitutional: Negative for chills and fever.  HENT: Negative for congestion and sore throat.   Eyes: Negative for blurred vision and double vision.  Respiratory: Negative for shortness of breath.   Cardiovascular: Negative for chest pain.  Gastrointestinal: Negative for heartburn, nausea and vomiting.  Genitourinary: Negative.   Musculoskeletal: Negative.  Negative for myalgias.  Skin: Negative for rash.  Neurological: Negative for dizziness and headaches.  Endo/Heme/Allergies: Does not bruise/bleed easily.  Psychiatric/Behavioral: Negative for depression. The  patient is not nervous/anxious.      Physical Exam BP 98/60   Pulse 76   Temp 97.8 F (36.6 C) (Temporal)   Ht 5' 5.75" (1.67 m)   Wt 203 lb 8 oz (92.3 kg)   LMP 05/31/2010   SpO2 97%   BMI 33.10 kg/m    BP Readings from Last 3 Encounters:  06/09/20 98/60  11/05/19 110/78  08/20/18 124/74      Physical Exam Constitutional:      General: She is not in acute distress.    Appearance: She is well-developed. She is not diaphoretic.  HENT:     Head: Normocephalic and atraumatic.     Right Ear: External ear normal.     Left Ear: External ear normal.     Nose: Nose normal.  Eyes:     General: No scleral icterus.    Conjunctiva/sclera: Conjunctivae normal.  Cardiovascular:     Rate and Rhythm: Normal rate and regular rhythm.     Heart sounds: No murmur heard.   Pulmonary:     Effort: Pulmonary effort is normal. No respiratory distress.     Breath sounds: Normal breath sounds. No wheezing.  Abdominal:     General: Bowel sounds are normal. There is no distension.     Palpations: Abdomen is soft. There is no mass.     Tenderness: There is no abdominal tenderness. There is no guarding or rebound.  Musculoskeletal:        General: Normal range of motion.     Cervical back: Neck supple.  Lymphadenopathy:     Cervical: No cervical  adenopathy.  Skin:    General: Skin is warm and dry.     Capillary Refill: Capillary refill takes less than 2 seconds.  Neurological:     Mental Status: She is alert and oriented to person, place, and time.     Deep Tendon Reflexes: Reflexes normal.  Psychiatric:        Behavior: Behavior normal.      Results:  PHQ-9:   Depression screen [facility] 2/9 06/09/2020 11/05/2019 09/19/2018  Decreased Interest 0 0 0  Down, Depressed, Hopeless 0 0 0  PHQ - 2 Score 0 0 0  Some recent data might be hidden      Assessment: 40 y.o. G60P2010 female here for routine annual physical examination.  Plan: Problem List Items Addressed This Visit      Other   BRCA1 positive    Other Visit Diagnoses    Annual physical exam    -  Primary   Relevant Orders   Lipid panel   Comprehensive metabolic panel      Screening: -- Blood pressure screen normal -- cholesterol screening: will obtain -- Weight screening: overweight: continue to monitor -- Diabetes Screening: not due for screening -- Nutrition: Encouraged healthy diet  The 10-year ASCVD risk score Mikey Bussing DC Jr., et al., 2013) is: 0.4%   Values used to calculate the score:     Age: 25 years     Sex: Female     Is Non-Hispanic African American: No     Diabetic: No     Tobacco smoker: No     Systolic Blood Pressure: 98 mmHg     Is BP treated: No     HDL Cholesterol: 53.8 mg/dL     Total Cholesterol: 195 mg/dL  -- Statin therapy for Age 68-75 with CVD risk >7.5%  Psych -- Depression screening (PHQ-9):   Depression screen [facility] 2/9  06/09/2020 11/05/2019 09/19/2018  Decreased Interest 0 0 0  Down, Depressed, Hopeless 0 0 0  PHQ - 2 Score 0 0 0  Some recent data might be hidden      Safety -- tobacco screening: not using -- alcohol screening:  low-risk usage. -- no evidence of domestic violence or intimate partner violence.   Cancer Screening -- pap smear not collected per ASCCP guidelines -- family history of breast cancer  screening: done. not at high risk. -- Mammogram - up to date -- Colon cancer (age 31+)-- not indicated  Immunizations Immunization History  Administered Date(s) Administered  . Influenza Split 10/19/2014  . Influenza, Quadrivalent, Recombinant, Inj, Pf 10/20/2016  . Influenza,inj,Quad PF,6+ Mos 10/25/2019  . Influenza-Unspecified 11/01/2015, 10/15/2017  . PFIZER(Purple Top)SARS-COV-2 Vaccination 05/31/2019, 06/24/2019    -- flu vaccine up to date -- TDAP q10 years unknown, record requested -- Covid-19 Vaccine up to date   Encouraged healthy diet and exercise. Encouraged regular vision and dental care.   Lesleigh Noe, MD

## 2020-10-07 ENCOUNTER — Telehealth (INDEPENDENT_AMBULATORY_CARE_PROVIDER_SITE_OTHER): Payer: 59 | Admitting: Nurse Practitioner

## 2020-10-07 ENCOUNTER — Other Ambulatory Visit: Payer: Self-pay

## 2020-10-07 ENCOUNTER — Encounter: Payer: Self-pay | Admitting: Nurse Practitioner

## 2020-10-07 VITALS — Temp 98.2°F

## 2020-10-07 DIAGNOSIS — U071 COVID-19: Secondary | ICD-10-CM | POA: Insufficient documentation

## 2020-10-07 MED ORDER — MOLNUPIRAVIR EUA 200MG CAPSULE: 4.0000 | ORAL_CAPSULE | Freq: Two times a day (BID) | ORAL | 0 refills | Status: AC

## 2020-10-07 NOTE — Assessment & Plan Note (Signed)
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.  Started feeling poorly on 10/03/2020.  Tested positive on 10/06/2020.  Patient does have a comorbid condition of obesity with a BMI greater than 30.  Did discuss treatment options she is not a candidate for paxlovid due to not having recent lab work.  Did discuss that molnupiravir is under emergency use authorization only.  Did discuss side effects including belly upset and taste disturbance.  Did let her know that medication is unknown and reproductive status.  Patient has had a hysterectomy. Patient to start molnupiravir.  Beer and Mucinex for symptomatic treatment.

## 2020-10-07 NOTE — Progress Notes (Signed)
Patient ID: [name], female    DOB: [birth year], 40 y.o.   MRN: [id number]  Virtual visit completed through [place], a video enabled telemedicine application. Due to national recommendations of social distancing due to COVID-19, a virtual visit is felt to be most appropriate for this patient at this time. Reviewed limitations, risks, security and privacy concerns of performing a virtual visit and the availability of in person appointments. I also reviewed that there may be a patient responsible charge related to this service. The patient agreed to proceed.   Patient location: home Provider location: [place] [facility], office Persons participating in this virtual visit: patient, provider   If any vitals were documented, they were collected by patient at home unless specified below.    Temp 98.2 F (36.8 C) Comment: per patient  LMP 05/31/2010    CC: Covid 19 Subjective:   HPI: [name] is a 40 y.o. female presenting on 10/07/2020 for Covid Positive (On 10/06/20, sx started on 10/03/20. Sx are sore throat, drainage, congestion, sinus pressure. Has used Flonase, Dayquil and Motrin one time)  Symptoms 10/03/2020. Was tested on 10/06/2020 and test positive. Symptoms have stayed the same. States that she has been vaccinated against covid. 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 She does have the risk factor of Obesity according to [facility] guidelines.  She has used flonase, dyquill, nyquill, and motrin with some relief.   Relevant past medical, surgical, family and social history reviewed and updated as indicated. Interim medical history since our last visit reviewed. Allergies and medications reviewed and updated. Outpatient Medications Prior to Visit  Medication Sig Dispense Refill   Cholecalciferol (VITAMIN D) 2000 units tablet Take 2,000 Units by mouth daily.     estradiol (CLIMARA - DOSED IN MG/24 HR) 0.0375 mg/24hr patch  Place 0.0375 mg onto the skin once a week.     ibuprofen (ADVIL,MOTRIN) 200 MG tablet Take 400 mg by mouth daily as needed. For pain.     Multiple Vitamins-Minerals (MULTIVITAMIN PO) Take 1 tablet by mouth daily.     Red Yeast Rice Extract (RED YEAST RICE PO) Take by mouth daily.     No facility-administered medications prior to visit.     Per HPI unless specifically indicated in ROS section below Review of Systems  Constitutional:  Negative for chills, fatigue and fever.  HENT:  Positive for sinus pressure and sore throat.   Respiratory:  Positive for cough. Negative for shortness of breath.   Cardiovascular:  Negative for chest pain.  Gastrointestinal:  Negative for abdominal pain, diarrhea, nausea and vomiting.  Musculoskeletal:  Negative for myalgias.  Neurological:  Positive for headaches.  Objective:  Temp 98.2 F (36.8 C) Comment: per patient  LMP 05/31/2010   Wt Readings from Last 3 Encounters:  06/09/20 203 lb 8 oz (92.3 kg)  11/05/19 194 lb 8 oz (88.2 kg)  08/20/18 199 lb 12.8 oz (90.6 kg)       Physical exam: Gen: alert, NAD, not ill appearing Pulm: speaks in complete sentences without increased work of breathing Psych: normal mood, normal thought content      Results for orders placed or performed in visit on 08/20/18  Comp Met (CMET)  Result Value Ref Range   Sodium 138 135 - 145 mEq/L   Potassium 3.8 3.5 - 5.1 mEq/L   Chloride 103 96 - 112 mEq/L   CO2 26 19 - 32 mEq/L  Glucose, Bld 102 (H) 70 - 99 mg/dL   BUN 12 6 - 23 mg/dL   Creatinine, Ser 0.78 0.40 - 1.20 mg/dL   Total Bilirubin 0.4 0.2 - 1.2 mg/dL   Alkaline Phosphatase 71 39 - 117 U/L   AST 18 0 - 37 U/L   ALT 15 0 - 35 U/L   Total Protein 7.4 6.0 - 8.3 g/dL   Albumin 4.7 3.5 - 5.2 g/dL   Calcium 9.3 8.4 - 10.5 mg/dL   GFR 82.02 >60.00 mL/min  CBC w/Diff  Result Value Ref Range   WBC 5.6 4.0 - 10.5 K/uL   RBC 4.40 3.87 - 5.11 Mil/uL   Hemoglobin 13.3 12.0 - 15.0 g/dL   HCT 39.1 36.0 - 46.0  %   MCV 88.9 78.0 - 100.0 fl   MCHC 34.0 30.0 - 36.0 g/dL   RDW 13.2 11.5 - 15.5 %   Platelets 232.0 150.0 - 400.0 K/uL   Neutrophils Relative % 67.7 43.0 - 77.0 %   Lymphocytes Relative 24.5 12.0 - 46.0 %   Monocytes Relative 6.2 3.0 - 12.0 %   Eosinophils Relative 0.6 0.0 - 5.0 %   Basophils Relative 1.0 0.0 - 3.0 %   Neutro Abs 3.8 1.4 - 7.7 K/uL   Lymphs Abs 1.4 0.7 - 4.0 K/uL   Monocytes Absolute 0.3 0.1 - 1.0 K/uL   Eosinophils Absolute 0.0 0.0 - 0.7 K/uL   Basophils Absolute 0.1 0.0 - 0.1 K/uL  TSH  Result Value Ref Range   TSH 2.05 0.35 - 4.50 uIU/mL  Lipid Profile  Result Value Ref Range   Cholesterol 195 0 - 200 mg/dL   Triglycerides 77.0 0.0 - 149.0 mg/dL   HDL 53.80 >39.00 mg/dL   VLDL 15.4 0.0 - 40.0 mg/dL   LDL Cholesterol 126 (H) 0 - 99 mg/dL   Total CHOL/HDL Ratio 4    NonHDL 141.33   VITAMIN D 25 Hydroxy (Vit-D Deficiency, Fractures)  Result Value Ref Range   VITD 42.95 30.00 - 100.00 ng/mL  Hemoglobin A1c  Result Value Ref Range   Hgb A1c MFr Bld 5.3 4.6 - 6.5 %   Assessment & Plan:   Problem List Items Addressed This Visit       Other   COVID-19 - Primary    Patient is an employee of [employer].  Started feeling poorly on 10/03/2020.  Tested positive on 10/06/2020.  Patient does have a comorbid condition of obesity with a BMI greater than 30.  Did discuss treatment options she is not a candidate for paxlovid due to not having recent lab work.  Did discuss that molnupiravir is under emergency use authorization only.  Did discuss side effects including belly upset and taste disturbance.  Did let her know that medication is unknown and reproductive status.  Patient has had a hysterectomy. Patient to start molnupiravir.  Beer and Mucinex for symptomatic treatment.      Relevant Medications   molnupiravir EUA 200 mg CAPS     No orders of the defined types were placed in this encounter.  No orders of the defined types were placed in this encounter.   I  discussed the assessment and treatment plan with the patient. The patient was provided an opportunity to ask questions and all were answered. The patient agreed with the plan and demonstrated an understanding of the instructions. The patient was advised to call back or seek an in-person evaluation if the symptoms worsen or if the condition  fails to improve as anticipated.  Follow up plan: Return if symptoms worsen or fail to improve.  Romilda Garret, NP

## 2020-10-16 DIAGNOSIS — Z1239 Encounter for other screening for malignant neoplasm of breast: Secondary | ICD-10-CM | POA: Diagnosis not present

## 2020-10-16 DIAGNOSIS — Z1379 Encounter for other screening for genetic and chromosomal anomalies: Secondary | ICD-10-CM | POA: Diagnosis not present

## 2020-10-16 DIAGNOSIS — Z1509 Genetic susceptibility to other malignant neoplasm: Secondary | ICD-10-CM | POA: Diagnosis not present

## 2020-10-16 DIAGNOSIS — Z1501 Genetic susceptibility to malignant neoplasm of breast: Secondary | ICD-10-CM | POA: Diagnosis not present

## 2020-10-16 DIAGNOSIS — Z803 Family history of malignant neoplasm of breast: Secondary | ICD-10-CM | POA: Diagnosis not present

## 2020-10-16 DIAGNOSIS — Z1231 Encounter for screening mammogram for malignant neoplasm of breast: Secondary | ICD-10-CM | POA: Diagnosis not present

## 2021-04-09 DIAGNOSIS — Z1501 Genetic susceptibility to malignant neoplasm of breast: Secondary | ICD-10-CM | POA: Diagnosis not present

## 2021-04-09 DIAGNOSIS — Z1509 Genetic susceptibility to other malignant neoplasm: Secondary | ICD-10-CM | POA: Diagnosis not present

## 2021-04-09 DIAGNOSIS — N6459 Other signs and symptoms in breast: Secondary | ICD-10-CM | POA: Diagnosis not present

## 2021-04-09 DIAGNOSIS — Z1239 Encounter for other screening for malignant neoplasm of breast: Secondary | ICD-10-CM | POA: Diagnosis not present

## 2021-04-09 DIAGNOSIS — Z803 Family history of malignant neoplasm of breast: Secondary | ICD-10-CM | POA: Diagnosis not present

## 2021-06-23 ENCOUNTER — Other Ambulatory Visit: Payer: Self-pay

## 2021-06-23 MED ORDER — ESTRADIOL 0.0375 MG/24HR TD PTWK
0.0375 mg | MEDICATED_PATCH | TRANSDERMAL | 0 refills | Status: AC
  Filled 2021-06-23 (×2): qty 4, 28d supply, fill #0

## 2021-06-29 DIAGNOSIS — Z01419 Encounter for gynecological examination (general) (routine) without abnormal findings: Secondary | ICD-10-CM | POA: Diagnosis not present

## 2021-08-18 ENCOUNTER — Other Ambulatory Visit: Payer: Self-pay | Admitting: Family Medicine

## 2021-08-18 DIAGNOSIS — E782 Mixed hyperlipidemia: Secondary | ICD-10-CM

## 2021-08-18 DIAGNOSIS — E669 Obesity, unspecified: Secondary | ICD-10-CM

## 2021-08-18 NOTE — Progress Notes (Signed)
Ordering liver and kidney function as well as cholesterol labs.   If patient would like additional labs, I recommend she wait until the day of her visit to discuss concerns and desired blood work

## 2021-09-02 ENCOUNTER — Other Ambulatory Visit (INDEPENDENT_AMBULATORY_CARE_PROVIDER_SITE_OTHER): Payer: 59

## 2021-09-02 DIAGNOSIS — E669 Obesity, unspecified: Secondary | ICD-10-CM | POA: Diagnosis not present

## 2021-09-02 DIAGNOSIS — E782 Mixed hyperlipidemia: Secondary | ICD-10-CM

## 2021-09-02 LAB — COMPREHENSIVE METABOLIC PANEL
ALT: 17 U/L (ref 0–35)
AST: 19 U/L (ref 0–37)
Albumin: 4.5 g/dL (ref 3.5–5.2)
Alkaline Phosphatase: 71 U/L (ref 39–117)
BUN: 14 mg/dL (ref 6–23)
CO2: 29 mEq/L (ref 19–32)
Calcium: 9.3 mg/dL (ref 8.4–10.5)
Chloride: 104 mEq/L (ref 96–112)
Creatinine, Ser: 0.67 mg/dL (ref 0.40–1.20)
GFR: 107.75 mL/min (ref >60.00)
Glucose, Bld: 91 mg/dL (ref 70–99)
Potassium: 4.1 mEq/L (ref 3.5–5.1)
Sodium: 140 mEq/L (ref 135–145)
Total Bilirubin: 0.4 mg/dL (ref 0.2–1.2)
Total Protein: 7.4 g/dL (ref 6.0–8.3)

## 2021-09-02 LAB — LIPID PANEL
Cholesterol: 200 mg/dL (ref 0–200)
HDL: 53.9 mg/dL (ref >39.00)
LDL Cholesterol: 120 mg/dL — ABNORMAL HIGH (ref 0–99)
NonHDL: 146.07
Total CHOL/HDL Ratio: 4
Triglycerides: 130 mg/dL (ref 0.0–149.0)
VLDL: 26 mg/dL (ref 0.0–40.0)

## 2021-09-03 ENCOUNTER — Other Ambulatory Visit: Payer: 59

## 2021-09-06 ENCOUNTER — Ambulatory Visit (INDEPENDENT_AMBULATORY_CARE_PROVIDER_SITE_OTHER): Payer: 59 | Admitting: Family Medicine

## 2021-09-06 ENCOUNTER — Encounter: Payer: Self-pay | Admitting: Family Medicine

## 2021-09-06 VITALS — BP 100/62 | HR 79 | Temp 97.6°F | Ht 66.0 in | Wt 203.1 lb

## 2021-09-06 DIAGNOSIS — Z0000 Encounter for general adult medical examination without abnormal findings: Secondary | ICD-10-CM

## 2021-09-06 DIAGNOSIS — L309 Dermatitis, unspecified: Secondary | ICD-10-CM | POA: Diagnosis not present

## 2021-09-06 DIAGNOSIS — Z23 Encounter for immunization: Secondary | ICD-10-CM | POA: Diagnosis not present

## 2021-09-06 MED ORDER — TRIAMCINOLONE ACETONIDE 0.1 % EX CREA: 1.0000 | TOPICAL_CREAM | Freq: Two times a day (BID) | CUTANEOUS | 0 refills | Status: DC

## 2021-09-06 NOTE — Patient Instructions (Addendum)
Triamcinolone - twice daily if no improvement in 2 weeks let me know   Your cholesterol is high.  You can work to lower cholesterol through diet and exercise.  I would recommend considering the following:   1) Diet -Mediterranean diet, vegetarian or meat restricted diet, low carbohydrate diet, avoiding trans fats, or the Dash diet.   2) You can consider trying omega-3 fatty acids or red yeast rice which may have some benefit.  But dietary changes are better than supplements.  3) Exercise - I recommended 30 minutes of brisk walking 5-7 days a week

## 2021-09-06 NOTE — Progress Notes (Signed)
Annual Exam   Chief Complaint:  Chief Complaint  Patient presents with   Annual Exam   Skin Discoloration    On R shin     History of Present Illness:  [name] is a 41 y.o. G4P2010 who LMP was Patient's last menstrual period was 05/31/2010., presents today for her annual examination.    #Skin lesion - sometimes itchy - present x 1 year - was scabbed when she went camping with sun exposure - sometimes will be doing yard work - had a similar patch on the arm several years ago - has not tried any treatment  Nutrition Diet: regular, healthy Exercise: has been trying to exercise, getting some barriers - physical job She does get adequate calcium and Vitamin D in her diet.   Social History   Tobacco Use  Smoking Status Never  Smokeless Tobacco Never   Social History   Substance and Sexual Activity  Alcohol Use Yes   Alcohol/week: 0.0 standard drinks of alcohol   Comment: once a month   Social History   Substance and Sexual Activity  Drug Use No    Safety The patient wears seatbelts: yes.     The patient feels safe at home and in their relationships: yes.  General Health Dentist in the last year: Yes Eye doctor: yes  Menstrual S/p hysterectomy  GYN She is single partner, contraception - status post hysterectomy.    Cervical Cancer Screening:   Last Pap: s/p hysterectomy   Breast Cancer Screening There is  FH of breast cancer. There is FH of ovarian cancer. BRCA screening already done.  Discussed that for average risk women between age 40-49 screening may reduce the risk of breast cancer death, however, at a lower rate than those over age 59. And that the the false-positive rates resulting in unnecessary biopsies with more screening is higher. The balance of benefits vs harms likely improves as you progress through your 40s. The patient does want a mammogram this year.     Weight Wt Readings from Last 3 Encounters:  09/06/21 203 lb 2 oz  (92.1 kg)  06/09/20 203 lb 8 oz (92.3 kg)  11/05/19 194 lb 8 oz (88.2 kg)   Patient has high BMI  BMI Readings from Last 1 Encounters:  09/06/21 32.79 kg/m     Chronic disease screening Blood pressure monitoring:  BP Readings from Last 3 Encounters:  09/06/21 100/62  06/09/20 98/60  11/05/19 110/78    Lipid Monitoring: Indication for screening: age >22, obesity, diabetes, family hx, CV risk factors.  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   Sore throat    recent 10/17/2014 to see PCP on 10/17/14     Past Surgical History:  Procedure Laterality Date   ABDOMINAL HYSTERECTOMY  12/17/2010   CESAREAN SECTION  2004, 2006   DILATION AND CURETTAGE OF UTERUS  DILATION AND EVACUATION  08/12/2010   Procedure: DILATATION AND EVACUATION (D&E);  Surgeon: Melina Schools, MD;  Location: [facility];  Service: Gynecology;  Laterality: N/A;   LAPAROSCOPIC OOPHERECTOMY Bilateral 10/22/2014   Procedure: BILATERAL LAPAROSCOPIC OOPHERECTOMY;  Surgeon: Nancy Marus, MD;  Location: [facility];  Service: Gynecology;  Laterality: Bilateral;   LASIK Bilateral 03/02/2017    Prior to Admission medications   Medication Sig Start Date End Date Taking? Authorizing Provider  Cholecalciferol (VITAMIN D) 2000 units tablet Take 2,000 Units by mouth daily.   Yes [provider]  estradiol (CLIMARA - DOSED IN MG/24 HR) 0.0375 mg/24hr patch Place 1 patch onto the skin  once a week 06/23/21  Yes   ibuprofen (ADVIL,MOTRIN) 200 MG tablet Take 400 mg by mouth daily as needed. For pain.   Yes [provider]  Multiple Vitamins-Minerals (MULTIVITAMIN PO) Take 1 tablet by mouth daily.   Yes [provider]  Red Yeast Rice Extract (RED YEAST RICE PO) Take by mouth daily.   Yes [provider]    Allergies  Allergen Reactions   Gadobenate Hives    Gynecologic History: Patient's last menstrual period was 05/31/2010.  Obstetric History: G4P2010  Social History   Socioeconomic History   Marital status: Married    Spouse name: [name]   Number of children: 2   Years of education: bachelors   Highest education level: Not on file  Occupational History   Not on file  Tobacco Use   Smoking status: Never   Smokeless tobacco: Never  Vaping Use   Vaping Use: Never used  Substance and Sexual Activity   Alcohol use: Yes    Alcohol/week: 0.0 standard drinks of alcohol    Comment: once a month   Drug use: No   Sexual activity: Yes    Birth control/protection: Surgical  Other Topics Concern   Not on file  Social History Narrative   11/05/19   From: [place] but in the states [place] an infant   Living: with [name], husband (2003)   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, 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      Family: 2 children (teenagers) - [name] and [name]       Enjoys: hiking, camping, cooking, cycling (helmet)      Exercise: nothing regular - but on occasion   Diet: ok, could be better      Safety   Seat belts: Yes    Guns: No   Safe in relationships: Yes    Social Determinants of Radio broadcast assistant Strain: Not on file  Food Insecurity: Not on file  Transportation Needs: Not on file  Physical Activity: Not on file  Stress: Not on file  Social Connections: Not on file  Intimate Partner Violence: Not on file    Family History  Problem Relation Age of Onset   Ovarian cancer Mother 70   BRCA 1/2 Mother        BRCA1 mutation   Ovarian cancer  Maternal Aunt 59   Ovarian cancer Maternal Aunt 60   Breast cancer Cousin 27   Cancer Other        "abdominal ca" in 3 sisters of mat grandfather   Diabetes Maternal Grandfather    Diabetes Paternal Grandmother    CAD Paternal Grandmother     Review of Systems  Constitutional:  Negative for chills and fever.  HENT:  Negative for congestion and sore throat.   Eyes:  Negative for blurred vision and double vision.  Respiratory:  Negative for shortness of breath.   Cardiovascular:  Negative for chest pain.  Gastrointestinal:  Negative for heartburn, nausea and vomiting.  Genitourinary: Negative.   Musculoskeletal: Negative.  Negative for myalgias.  Skin:  Negative for rash.  Neurological:  Negative for dizziness and headaches.  Endo/Heme/Allergies:  Does not bruise/bleed easily.  Psychiatric/Behavioral:  Negative for depression. The patient is not nervous/anxious.      Physical Exam BP 100/62   Pulse 79   Temp 97.6 F (36.4 C) (Temporal)   Ht 5' 6"  (1.676 m)   Wt 203 lb 2 oz (92.1 kg)   LMP 05/31/2010   SpO2 96%   BMI 32.79 kg/m    BP Readings from Last 3 Encounters:  09/06/21 100/62  06/09/20 98/60  11/05/19 110/78      Physical Exam Constitutional:      General: She is not in acute distress.    Appearance: She is well-developed. She is not diaphoretic.  HENT:     Head: Normocephalic and atraumatic.     Right Ear: External ear normal.     Left Ear: External ear normal.     Nose: Nose normal.  Eyes:     General: No scleral icterus.    Extraocular Movements: Extraocular movements intact.     Conjunctiva/sclera: Conjunctivae normal.  Cardiovascular:     Rate and Rhythm: Normal rate and regular rhythm.     Heart sounds: No murmur heard. Pulmonary:     Effort: Pulmonary effort is normal. No respiratory distress.     Breath sounds: Normal breath sounds. No wheezing.  Abdominal:     General: Bowel sounds are normal. There is no distension.     Palpations:  Abdomen is soft. There is no mass.     Tenderness: There is no abdominal tenderness. There is no guarding or rebound.  Musculoskeletal:        General: Normal range of motion.     Cervical back: Neck supple.  Lymphadenopathy:     Cervical: No cervical adenopathy.  Skin:    General: Skin is warm and dry.     Capillary Refill: Capillary refill takes less than 2 seconds.     Comments: Right shin with papular raised rash with mild erythema   Neurological:     Mental Status: She is alert and oriented to person, place, and time.     Deep Tendon Reflexes: Reflexes normal.  Psychiatric:        Mood and Affect: Mood normal.        Behavior: Behavior normal.      Results:  PHQ-9:     09/06/2021   12:05 PM 06/09/2020    2:55 PM 11/05/2019   11:44 AM  Depression screen PHQ 2/9  Decreased Interest 0 0 0  Down, Depressed, Hopeless 0 0 0  PHQ - 2 Score 0 0 0       Assessment: 41 y.o. G21P2010 female here for routine annual physical examination.  Plan: Problem List Items Addressed This Visit   None Visit Diagnoses     Annual physical exam    -  Primary   Dermatitis       Relevant Medications   triamcinolone cream (KENALOG) 0.1 %   Need for Tdap vaccination       Relevant Orders   Tdap vaccine greater than or equal to 7yo IM (Completed)       Screening: -- Blood pressure screen normal -- cholesterol screening:  elevated -- Weight screening: overweight: continue  to monitor -- Diabetes Screening: not due for screening -- Nutrition: Encouraged healthy diet  The 10-year ASCVD risk score (Arnett DK, et al., 2019) is: 0.4%   Values used to calculate the score:     Age: 21 years     Sex: Female     Is Non-Hispanic African American: No     Diabetic: No     Tobacco smoker: No     Systolic Blood Pressure: 356 mmHg     Is BP treated: No     HDL Cholesterol: 53.9 mg/dL     Total Cholesterol: 200 mg/dL  -- Statin therapy for Age 99-75 with CVD risk >7.5%  Psych --  Depression screening (PHQ-9):    Safety -- tobacco screening: not using -- alcohol screening:  low-risk usage. -- no evidence of domestic violence or intimate partner violence.   Cancer Screening -- pap smear no longer needed -- family history of breast cancer screening: done. not at high risk. -- Mammogram -  up to date -- Colon cancer (age 45+)-- not indicated  Immunizations Immunization History  Administered Date(s) Administered   Influenza Split 10/19/2014   Influenza, Quadrivalent, Recombinant, Inj, Pf 10/20/2016   Influenza,inj,Quad PF,6+ Mos 10/25/2019   Influenza-Unspecified 11/01/2015, 10/15/2017   PFIZER(Purple Top)SARS-COV-2 Vaccination 05/31/2019, 06/24/2019   Tdap 09/06/2021    -- flu vaccine encouraged seasonal -- TDAP q10 years not up to date - given today --  -- Covid-19 Vaccine up to date   Encouraged healthy diet and exercise. Encouraged regular vision and dental care.   Lesleigh Noe, MD

## 2021-11-02 DIAGNOSIS — N6489 Other specified disorders of breast: Secondary | ICD-10-CM | POA: Diagnosis not present

## 2021-11-02 DIAGNOSIS — Z1509 Genetic susceptibility to other malignant neoplasm: Secondary | ICD-10-CM | POA: Diagnosis not present

## 2021-11-02 DIAGNOSIS — Z803 Family history of malignant neoplasm of breast: Secondary | ICD-10-CM | POA: Diagnosis not present

## 2021-11-02 DIAGNOSIS — Z1239 Encounter for other screening for malignant neoplasm of breast: Secondary | ICD-10-CM | POA: Diagnosis not present

## 2021-11-02 DIAGNOSIS — Z1501 Genetic susceptibility to malignant neoplasm of breast: Secondary | ICD-10-CM | POA: Diagnosis not present

## 2021-11-02 DIAGNOSIS — Z1231 Encounter for screening mammogram for malignant neoplasm of breast: Secondary | ICD-10-CM | POA: Diagnosis not present

## 2022-02-03 ENCOUNTER — Telehealth: Payer: Self-pay

## 2022-02-03 NOTE — Telephone Encounter (Signed)
LVM for patient tcb and schedule 

## 2022-02-03 NOTE — Telephone Encounter (Signed)
Received refill request    Please call and schedule patient a TOC in order to provide refills.  Let me know once scheduled. Thanks!

## 2022-05-16 ENCOUNTER — Encounter: Payer: Self-pay | Admitting: *Deleted

## 2022-05-16 ENCOUNTER — Encounter: Payer: Self-pay | Admitting: Family

## 2022-05-16 ENCOUNTER — Ambulatory Visit: Payer: Commercial Managed Care - PPO | Admitting: Family

## 2022-05-16 VITALS — BP 128/72 | HR 68 | Temp 97.7°F | Ht 66.0 in | Wt 202.8 lb

## 2022-05-16 DIAGNOSIS — Z1501 Genetic susceptibility to malignant neoplasm of breast: Secondary | ICD-10-CM | POA: Diagnosis not present

## 2022-05-16 DIAGNOSIS — Z1509 Genetic susceptibility to other malignant neoplasm: Secondary | ICD-10-CM

## 2022-05-16 DIAGNOSIS — E8940 Asymptomatic postprocedural ovarian failure: Secondary | ICD-10-CM

## 2022-05-16 DIAGNOSIS — E782 Mixed hyperlipidemia: Secondary | ICD-10-CM | POA: Diagnosis not present

## 2022-05-16 DIAGNOSIS — L989 Disorder of the skin and subcutaneous tissue, unspecified: Secondary | ICD-10-CM

## 2022-05-16 DIAGNOSIS — E7800 Pure hypercholesterolemia, unspecified: Secondary | ICD-10-CM | POA: Diagnosis not present

## 2022-05-16 DIAGNOSIS — E559 Vitamin D deficiency, unspecified: Secondary | ICD-10-CM

## 2022-05-16 NOTE — Assessment & Plan Note (Signed)
Continue daily supplementation 

## 2022-05-16 NOTE — Assessment & Plan Note (Signed)
Continue f/u with oncologist as necessary

## 2022-05-16 NOTE — Assessment & Plan Note (Signed)
Will reassess at CPE in August  Work on low cholesterol diet and exercise as tolerated

## 2022-05-16 NOTE — Progress Notes (Signed)
Established Patient Office Visit  Subjective:  Patient ID: [name], female    DOB: [birth year]  Age: 42 y.o. MRN: [id number]  CC:  Chief Complaint  Patient presents with   Establish Care    TOC from Dr Selena Batten    HPI [name] is here for a transition of care visit.  Prior provider was: Dr. Gweneth Dimitri   Pt is with acute concerns.  Has a white hypopigmented area on left anterior forearm mid aspect. She states has been for a few months. Doesn't recollect how this could have occurred. Also a similar spot left upper anterior arm. Doesn't itch or hurt. Flat.    chronic concerns:  On estradiol patch, with Dr. Hinton Dyer at [facility].   BRCA 1, currently followed by Dr. Cliffton Asters oncologist. She is followed for routine high risk breast f/u. Mammogram 11/02/21 unremarkable. 02/10/2017, left breast MRI fibrocystic changes.  Did have bil salpino-oophorectomy  They have also recommended mastectomy however pt still considering, she has an appt to discuss this further with surgeon.  Has strong fmh breast and ovarian cancer.      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   Sore throat    recent 10/17/2014 to see PCP on 10/17/14     Past Surgical History:  Procedure Laterality Date   CESAREAN SECTION  2004, 2006   DILATION AND CURETTAGE OF UTERUS     DILATION AND EVACUATION  08/12/2010   Procedure: DILATATION AND EVACUATION (D&E);  Surgeon: Bing Plume, MD;  Location: [facility];  Service: Gynecology;  Laterality: N/A;   LAPAROSCOPIC OOPHERECTOMY Bilateral 10/22/2014   Procedure: BILATERAL LAPAROSCOPIC OOPHERECTOMY;  Surgeon: Cleda Mccreedy, MD;  Location: [facility];  Service: Gynecology;  Laterality: Bilateral;   LAPAROSCOPIC TOTAL HYSTERECTOMY  12/17/2010   42 y/o   LASIK Bilateral 03/02/2017    Family History  Problem Relation Age of  Onset   Ovarian cancer Mother 57   BRCA 1/2 Mother        BRCA1 mutation   Diabetes Maternal Grandfather    Diabetes Paternal Grandmother    CAD Paternal Grandmother    Ovarian cancer Maternal Aunt 59   Ovarian cancer Maternal Aunt 7   Breast cancer Cousin 27   Cancer Other        "abdominal ca" in 3 sisters of mat grandfather    Social History   Socioeconomic History   Marital status: Married    Spouse name: Reuel Boom   Number of children: 2   Years of education: bachelors   Highest education level: Not on file  Occupational History   Occupation: Teacher, 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[employer]  Tobacco Use   Smoking status: Never   Smokeless tobacco: Never  Vaping Use   Vaping Use: Never used  Substance and Sexual Activity   Alcohol use: Yes    Alcohol/week: 0.0 standard drinks of alcohol    Comment: once a month   Drug use: No   Sexual activity: Yes    Partners: Male    Birth control/protection: Surgical  Other Topics Concern   Not on file  Social History Narrative   11/05/19   From: [place] but in the states as an infant   Living: with Reuel Boom, husband (2003)   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 [employer]  Family: 2 children (teenagers) - Jacquenette Shone and Donata Clay       Enjoys: hiking, camping, cooking, cycling (helmet)      Exercise: nothing regular - but on occasion   Diet: ok, could be better      Safety   Seat belts: Yes    Guns: No   Safe in relationships: Yes    Social Determinants of Corporate investment banker Strain: Not on file  Food Insecurity: Not on file  Transportation Needs: Not on file  Physical Activity: Not on file  Stress: Not on file  Social Connections: Not on file  Intimate Partner Violence: Not on file    Outpatient Medications Prior to Visit  Medication Sig Dispense Refill   Cholecalciferol (VITAMIN D) 2000 units tablet Take 2,000 Units by mouth daily.     estradiol (CLIMARA - DOSED IN MG/24 HR) 0.0375 mg/24hr patch Place 1 patch onto the skin once a week 12  patch 0   ibuprofen (ADVIL,MOTRIN) 200 MG tablet Take 400 mg by mouth daily as needed. For pain.     Multiple Vitamins-Minerals (MULTIVITAMIN PO) Take 1 tablet by mouth daily.     Red Yeast Rice Extract (RED YEAST RICE PO) Take by mouth daily.     triamcinolone cream (KENALOG) 0.1 % Apply 1 Application topically 2 (two) times daily. 30 g 0   No facility-administered medications prior to visit.    Allergies  Allergen Reactions   Gadobenate Hives    ROS: Pertinent symptoms negative unless otherwise noted in HPI     Objective:    Physical Exam Vitals reviewed.  Constitutional:      Appearance: Normal appearance. She is obese.  Eyes:     General:        Right eye: No discharge.        Left eye: No discharge.     Conjunctiva/sclera: Conjunctivae normal.  Cardiovascular:     Rate and Rhythm: Normal rate and regular rhythm.  Pulmonary:     Effort: Pulmonary effort is normal. No respiratory distress.  Musculoskeletal:        General: Normal range of motion.     Cervical back: Normal range of motion.  Skin:    Comments: Hypopigmented skin flat lesion on left anterior forearm and also slight on left upper anterior arm   Neurological:     General: No focal deficit present.     Mental Status: She is alert and oriented to person, place, and time. Mental status is at baseline.  Psychiatric:        Mood and Affect: Mood normal.        Behavior: Behavior normal.        Thought Content: Thought content normal.        Judgment: Judgment normal.       BP 128/72   Pulse 68   Temp 97.7 F (36.5 C) (Temporal)   Ht  (1.676 m)   Wt 202 lb 12.8 oz (92 kg)   LMP 05/31/2010   SpO2 98%   BMI 32.73 kg/m  Wt Readings from Last 3 Encounters:  05/16/22 202 lb 12.8 oz (92 kg)  09/06/21 203 lb 2 oz (92.1 kg)  06/09/20 203 lb 8 oz (92.3 kg)     Health Maintenance Due  Topic Date Due   MAMMOGRAM  05/04/2022    There are no preventive care reminders to display for this  patient.  Lab Results  Component Value Date   TSH 2.05  08/20/2018   Lab Results  Component Value Date   WBC 5.6 08/20/2018   HGB 13.3 08/20/2018   HCT 39.1 08/20/2018   MCV 88.9 08/20/2018   PLT 232.0 08/20/2018   Lab Results  Component Value Date   NA 140 09/02/2021   K 4.1 09/02/2021   CHLORIDE 106 01/07/2016   CO2 29 09/02/2021   GLUCOSE 91 09/02/2021   BUN 14 09/02/2021   CREATININE 0.67 09/02/2021   BILITOT 0.4 09/02/2021   ALKPHOS 71 09/02/2021   AST 19 09/02/2021   ALT 17 09/02/2021   PROT 7.4 09/02/2021   ALBUMIN 4.5 09/02/2021   CALCIUM 9.3 09/02/2021   ANIONGAP 7 01/07/2016   EGFR >90 01/07/2016   GFR 107.75 09/02/2021   Lab Results  Component Value Date   CHOL 200 09/02/2021   Lab Results  Component Value Date   HDL 53.90 09/02/2021   Lab Results  Component Value Date   LDLCALC 120 (H) 09/02/2021   Lab Results  Component Value Date   TRIG 130.0 09/02/2021   Lab Results  Component Value Date   CHOLHDL 4 09/02/2021   Lab Results  Component Value Date   HGBA1C 5.3 08/20/2018      Assessment & Plan:   Vitamin D deficiency Assessment & Plan: Continue daily supplementation     Elevated LDL cholesterol level  Skin lesion of upper extremity -     Ambulatory referral to Dermatology  Premature surgical menopause Assessment & Plan: Cont f/u with GYN for estradiol   BRCA1 positive Assessment & Plan: Continue f/u with oncologist as necessary   Mixed hyperlipidemia Assessment & Plan: Will reassess at CPE in August  Work on low cholesterol diet and exercise as tolerated      No orders of the defined types were placed in this encounter.   Follow-up: Return in about 6 months (around 11/15/2022) for f/u CPE.    Mort Sawyers, FNP

## 2022-05-16 NOTE — Assessment & Plan Note (Signed)
Cont f/u with GYN for estradiol

## 2022-05-16 NOTE — Patient Instructions (Signed)
A referral was placed today for dermatology.  Please let us know if you have not heard back within 2 weeks about the referral.   Regards,   Montray Kliebert FNP-C   

## 2022-06-16 DIAGNOSIS — Z1239 Encounter for other screening for malignant neoplasm of breast: Secondary | ICD-10-CM | POA: Diagnosis not present

## 2022-06-16 DIAGNOSIS — Z1501 Genetic susceptibility to malignant neoplasm of breast: Secondary | ICD-10-CM | POA: Diagnosis not present

## 2022-06-16 DIAGNOSIS — Z1509 Genetic susceptibility to other malignant neoplasm: Secondary | ICD-10-CM | POA: Diagnosis not present

## 2022-07-19 DIAGNOSIS — Z4001 Encounter for prophylactic removal of breast: Secondary | ICD-10-CM | POA: Diagnosis not present

## 2022-07-19 DIAGNOSIS — Z1501 Genetic susceptibility to malignant neoplasm of breast: Secondary | ICD-10-CM | POA: Diagnosis not present

## 2022-07-19 DIAGNOSIS — Z1509 Genetic susceptibility to other malignant neoplasm: Secondary | ICD-10-CM | POA: Diagnosis not present

## 2022-07-29 DIAGNOSIS — M9904 Segmental and somatic dysfunction of sacral region: Secondary | ICD-10-CM | POA: Diagnosis not present

## 2022-07-29 DIAGNOSIS — M62830 Muscle spasm of back: Secondary | ICD-10-CM | POA: Diagnosis not present

## 2022-07-29 DIAGNOSIS — M9903 Segmental and somatic dysfunction of lumbar region: Secondary | ICD-10-CM | POA: Diagnosis not present

## 2022-09-01 ENCOUNTER — Ambulatory Visit: Payer: Commercial Managed Care - PPO | Admitting: Internal Medicine

## 2022-09-01 ENCOUNTER — Encounter: Payer: Self-pay | Admitting: Internal Medicine

## 2022-09-01 VITALS — BP 110/70 | HR 90 | Temp 97.7°F | Ht 66.0 in | Wt 197.0 lb

## 2022-09-01 DIAGNOSIS — E782 Mixed hyperlipidemia: Secondary | ICD-10-CM

## 2022-09-01 DIAGNOSIS — R21 Rash and other nonspecific skin eruption: Secondary | ICD-10-CM | POA: Diagnosis not present

## 2022-09-01 DIAGNOSIS — Z79890 Hormone replacement therapy (postmenopausal): Secondary | ICD-10-CM | POA: Diagnosis not present

## 2022-09-01 LAB — CBC
HCT: 39.5 % (ref 36.0–46.0)
Hemoglobin: 13 g/dL (ref 12.0–15.0)
MCHC: 32.9 g/dL (ref 30.0–36.0)
MCV: 88.5 fl (ref 78.0–100.0)
Platelets: 255 10*3/uL (ref 150.0–400.0)
RBC: 4.47 Mil/uL (ref 3.87–5.11)
RDW: 13.3 % (ref 11.5–15.5)
WBC: 5 10*3/uL (ref 4.0–10.5)

## 2022-09-01 LAB — COMPREHENSIVE METABOLIC PANEL
ALT: 23 U/L (ref 0–35)
AST: 21 U/L (ref 0–37)
Albumin: 4.3 g/dL (ref 3.5–5.2)
Alkaline Phosphatase: 77 U/L (ref 39–117)
BUN: 9 mg/dL (ref 6–23)
CO2: 30 mEq/L (ref 19–32)
Calcium: 9.3 mg/dL (ref 8.4–10.5)
Chloride: 104 mEq/L (ref 96–112)
Creatinine, Ser: 0.7 mg/dL (ref 0.40–1.20)
GFR: 105.87 mL/min (ref >60.00)
Glucose, Bld: 99 mg/dL (ref 70–99)
Potassium: 4.4 mEq/L (ref 3.5–5.1)
Sodium: 141 mEq/L (ref 135–145)
Total Bilirubin: 0.4 mg/dL (ref 0.2–1.2)
Total Protein: 7.3 g/dL (ref 6.0–8.3)

## 2022-09-01 LAB — LIPID PANEL
Cholesterol: 156 mg/dL (ref 0–200)
HDL: 50.4 mg/dL (ref >39.00)
LDL Cholesterol: 79 mg/dL (ref 0–99)
NonHDL: 105.37
Total CHOL/HDL Ratio: 3
Triglycerides: 133 mg/dL (ref 0.0–149.0)
VLDL: 26.6 mg/dL (ref 0.0–40.0)

## 2022-09-01 NOTE — Progress Notes (Signed)
Subjective:    Patient ID: [name], female    DOB: [birth year], 42 y.o.   MRN: [id number]  HPI Here due to spots on skin  Started with scratch while at beach in [place] On dorsum of left wrist Next day or 2 days---noted brown spots in other places (8 days ago) And now some up her arm ?some on leg as well No itching  Some loose stools 6 days ago--lasted 3 days Went away with imodium (has stopped this now)  Current Outpatient Medications on File Prior to Visit  Medication Sig Dispense Refill   Cholecalciferol (VITAMIN D) 2000 units tablet Take 2,000 Units by mouth daily.     estradiol (CLIMARA - DOSED IN MG/24 HR) 0.0375 mg/24hr patch Place 1 patch onto the skin once a week 12 patch 0   ibuprofen (ADVIL,MOTRIN) 200 MG tablet Take 400 mg by mouth daily as needed. For pain.     No current facility-administered medications on file prior to visit.    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   Sore throat    recent 10/17/2014 to see PCP on 10/17/14     Past Surgical History:  Procedure Laterality Date   CESAREAN SECTION  2004, 2006   DILATION AND CURETTAGE OF UTERUS     DILATION AND EVACUATION  08/12/2010   Procedure: DILATATION AND EVACUATION (D&E);  Surgeon: Bing Plume, MD;  Location: [facility];  Service: Gynecology;  Laterality: N/A;   LAPAROSCOPIC OOPHERECTOMY Bilateral 10/22/2014   Procedure: BILATERAL LAPAROSCOPIC OOPHERECTOMY;  Surgeon: Cleda Mccreedy, MD;  Location: [facility];  Service: Gynecology;  Laterality: Bilateral;   LAPAROSCOPIC TOTAL HYSTERECTOMY  12/17/2010   42 y/o   LASIK Bilateral 03/02/2017    Family History  Problem Relation Age of Onset   Ovarian cancer Mother 18   BRCA 1/2 Mother        BRCA1 mutation   Diabetes Maternal Grandfather     Diabetes Paternal Grandmother    CAD Paternal Grandmother    Ovarian cancer Maternal Aunt 59   Ovarian cancer Maternal Aunt 34   Breast cancer Cousin 27   Cancer Other        "abdominal ca" in 3 sisters of mat grandfather    Social History   Socioeconomic History   Marital status: Married    Spouse name: Reuel Boom   Number of children: 2   Years of education: bachelors   Highest education level: Not on file  Occupational History   Occupation: Teacher, 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[employer]  Tobacco Use   Smoking status: Never   Smokeless tobacco: Never  Vaping Use   Vaping status: Never Used  Substance and Sexual Activity   Alcohol use: Yes    Alcohol/week: 0.0 standard drinks of alcohol    Comment: once a month   Drug use: No   Sexual activity: Yes    Partners: Male    Birth control/protection: Surgical  Other Topics Concern   Not on file  Social History Narrative   11/05/19   From: [place] but in the states as an infant   Living: with Reuel Boom, husband (2003)   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      Family: 2 children (teenagers) - Jacquenette Shone and  Donata Clay       Enjoys: hiking, camping, cooking, cycling (helmet)      Exercise: nothing regular - but on occasion   Diet: ok, could be better      Safety   Seat belts: Yes    Guns: No   Safe in relationships: Yes    Social Determinants of Corporate investment banker Strain: Not on file  Food Insecurity: Not on file  Transportation Needs: Not on file  Physical Activity: Not on file  Stress: Not on file  Social Connections: Not on file  Intimate Partner Violence: Not on file   Review of Systems No fever No cough or SOB--just has some mild congestion    Objective:   Physical Exam Constitutional:      Appearance: Normal appearance.  Lymphadenopathy:     Cervical: No cervical adenopathy.     Upper Body:     Left upper body: No axillary adenopathy.  Skin:    Comments: Granulated scratch over left wrist dorsum Multiple small  hyperpigmented macules (non blanching) on dorsum of hand--and several up arm No redness or inflammation  Neurological:     Mental Status: She is alert.            Assessment & Plan:

## 2022-09-01 NOTE — Assessment & Plan Note (Signed)
Clearly not infectious Not sure if it could be related to the scratch--but not sure how Doubt some kind of immune etiology (complexes??) No symptoms Can just observe for now----might need derm eval if worsens

## 2022-09-01 NOTE — Addendum Note (Signed)
Addended by: Alvina Chou on: 09/01/2022 10:43 AM   Modules accepted: Orders

## 2022-11-14 DIAGNOSIS — Z1239 Encounter for other screening for malignant neoplasm of breast: Secondary | ICD-10-CM | POA: Diagnosis not present

## 2022-11-14 DIAGNOSIS — Z1509 Genetic susceptibility to other malignant neoplasm: Secondary | ICD-10-CM | POA: Diagnosis not present

## 2022-11-14 DIAGNOSIS — Z1231 Encounter for screening mammogram for malignant neoplasm of breast: Secondary | ICD-10-CM | POA: Diagnosis not present

## 2022-11-14 DIAGNOSIS — Z1501 Genetic susceptibility to malignant neoplasm of breast: Secondary | ICD-10-CM | POA: Diagnosis not present

## 2022-11-14 DIAGNOSIS — R92313 Mammographic fatty tissue density, bilateral breasts: Secondary | ICD-10-CM | POA: Diagnosis not present

## 2022-11-16 ENCOUNTER — Ambulatory Visit: Payer: Commercial Managed Care - PPO | Admitting: Family

## 2022-11-16 ENCOUNTER — Encounter: Payer: Self-pay | Admitting: Family

## 2022-11-16 VITALS — BP 114/70 | HR 69 | Temp 98.2°F | Ht 66.0 in | Wt 200.4 lb

## 2022-11-16 DIAGNOSIS — E559 Vitamin D deficiency, unspecified: Secondary | ICD-10-CM

## 2022-11-16 DIAGNOSIS — E8940 Asymptomatic postprocedural ovarian failure: Secondary | ICD-10-CM | POA: Diagnosis not present

## 2022-11-16 DIAGNOSIS — Z8639 Personal history of other endocrine, nutritional and metabolic disease: Secondary | ICD-10-CM | POA: Diagnosis not present

## 2022-11-16 DIAGNOSIS — Z79890 Hormone replacement therapy (postmenopausal): Secondary | ICD-10-CM

## 2022-11-16 DIAGNOSIS — R011 Cardiac murmur, unspecified: Secondary | ICD-10-CM

## 2022-11-16 DIAGNOSIS — Z0000 Encounter for general adult medical examination without abnormal findings: Secondary | ICD-10-CM

## 2022-11-16 DIAGNOSIS — Z8759 Personal history of other complications of pregnancy, childbirth and the puerperium: Secondary | ICD-10-CM

## 2022-11-16 DIAGNOSIS — E782 Mixed hyperlipidemia: Secondary | ICD-10-CM

## 2022-11-16 NOTE — Progress Notes (Signed)
Subjective:  Patient ID: [name], female    DOB: 02-Feb-1980  Age: 42 y.o. MRN: [id number]  Patient Care Team: Mort Sawyers, FNP as PCP - General (Family Medicine)   CC:  Chief Complaint  Patient presents with   Annual Exam    HPI [name] is a 42 y.o. female who presents today for an annual physical exam. She reports consuming a general diet.  Walking 2-3 times a week walking briskly   She generally feels well. She reports sleeping fairly well. She does not have additional problems to discuss today.   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 has f/u next month with NP at oncology. She is pending bil mastectomy probably next year. Currently with biannual mammogram screenings.  Last pap: h/o hysterectomy for molar pregnancy , on HRT  Declines flu vaccination.   Pt is without acute concerns.  Lab Results  Component Value Date   CHOL 156 09/01/2022   HDL 50.40 09/01/2022   LDLCALC 79 09/01/2022   TRIG 133.0 09/01/2022   CHOLHDL 3 09/01/2022   Heart murmur on physical exam, pt without chest pain, DOE and or sob.   Advanced Directives Patient does not have advanced directives  DEPRESSION SCREENING    11/16/2022   10:27 AM 05/16/2022   10:46 AM 09/06/2021   12:05 PM 06/09/2020    2:55 PM 11/05/2019   11:44 AM 09/19/2018   11:27 AM 08/30/2017    5:09 PM  PHQ 2/9 Scores  PHQ - 2 Score 0 0 0 0 0 0 0  PHQ- 9 Score 0           ROS: Negative unless specifically indicated above in HPI.    Current Outpatient Medications:    Cholecalciferol (VITAMIN D) 2000 units tablet, Take 5,000 Units by mouth daily., Disp: , Rfl:    estradiol (CLIMARA - DOSED IN MG/24 HR) 0.0375 mg/24hr patch, Place 1 patch onto the skin once a week, Disp: 12 patch, Rfl: 0   ibuprofen (ADVIL,MOTRIN) 200 MG tablet, Take 400 mg by mouth daily as needed. For pain., Disp: , Rfl:    Red Yeast Rice 600  MG CAPS, Take by mouth., Disp: , Rfl:     Objective:    BP 114/70   Pulse 69   Temp 98.2 F (36.8 C)   Ht 5\' 6"  (1.676 m)   Wt 200 lb 6.4 oz (90.9 kg)   LMP 05/31/2010   SpO2 98%   BMI 32.35 kg/m   BP Readings from Last 3 Encounters:  11/16/22 114/70  09/01/22 110/70  05/16/22 128/72      Physical Exam Constitutional:      General: She is not in acute distress.    Appearance: Normal appearance. She is obese. She is not ill-appearing.  HENT:     Head: Normocephalic.     Right Ear: Tympanic membrane normal.     Left Ear: Tympanic membrane normal.     Nose: Nose normal.     Mouth/Throat:     Mouth: Mucous membranes are moist.  Eyes:     Extraocular Movements: Extraocular movements intact.     Pupils: Pupils are equal, round, and reactive to light.  Cardiovascular:     Rate and Rhythm: Normal rate and regular rhythm.     Heart sounds: Murmur heard.  Pulmonary:     Effort: Pulmonary effort is normal.     Breath  sounds: Normal breath sounds.  Abdominal:     General: Abdomen is flat. Bowel sounds are normal.     Palpations: Abdomen is soft.     Tenderness: There is no guarding or rebound.  Musculoskeletal:        General: Normal range of motion.     Cervical back: Normal range of motion.  Skin:    General: Skin is warm.     Capillary Refill: Capillary refill takes less than 2 seconds.  Neurological:     General: No focal deficit present.     Mental Status: She is alert.  Psychiatric:        Mood and Affect: Mood normal.        Behavior: Behavior normal.        Thought Content: Thought content normal.        Judgment: Judgment normal.          Assessment & Plan:  History of vitamin D deficiency  History of molar pregnancy  Surgical menopause on hormone replacement therapy  Encounter for general adult medical examination without abnormal findings Assessment & Plan: Patient Counseling(The following topics were reviewed):  Preventative care handout  given to pt  Health maintenance and immunizations reviewed. Please refer to Health maintenance section. Pt advised on safe sex, wearing seatbelts in car, and proper nutrition labwork ordered today for annual Dental health: Discussed importance of regular tooth brushing, flossing, and dental visits.    Heart murmur Assessment & Plan: New on exam, very slight.  No active chest pain or sob.  R/o cardiac cause    Orders: -     ECHOCARDIOGRAM COMPLETE; Future  Mixed hyperlipidemia Assessment & Plan: Work on low cholesterol diet and exercise as tolerated recent lipid panel within recommended limits continue red yeast rice over-the-counter    Vitamin D deficiency Assessment & Plan: Advised patient to decrease supplementation from 5000 once daily vitamin D2 01-1998 once daily       Follow-up: Return in about 1 year (around 11/16/2023) for f/u CPE.   Mort Sawyers, FNP

## 2022-11-16 NOTE — Patient Instructions (Signed)
  Stop by the lab prior to leaving today. I will notify you of your results once received.   Recommendations on keeping yourself healthy:  - Exercise at least 30-45 minutes a day, 3-4 days a week.  - Eat a low-fat diet with lots of fruits and vegetables, up to 7-9 servings per day.  - Seatbelts can save your life. Wear them always.  - Smoke detectors on every level of your home, check batteries every year.  - Eye Doctor - have an eye exam every 1-2 years  - Safe sex - if you may be exposed to STDs, use a condom.  - Alcohol -  If you drink, do it moderately, less than 2 drinks per day.  - Health Care Power of Attorney. Choose someone to speak for you if you are not able.  - Depression is common in our stressful world.If you're feeling down or losing interest in things you normally enjoy, please come in for a visit.  - Violence - If anyone is threatening or hurting you, please call immediately.  Due to recent changes in healthcare laws, you may see results of your imaging and/or laboratory studies on MyChart before I have had a chance to review them.  I understand that in some cases there may be results that are confusing or concerning to you. Please understand that not all results are received at the same time and often I may need to interpret multiple results in order to provide you with the best plan of care or course of treatment. Therefore, I ask that you please give me 2 business days to thoroughly review all your results before contacting my office for clarification. Should we see a critical lab result, you will be contacted sooner.   I will see you again in one year for your annual comprehensive exam unless otherwise stated and or with acute concerns.  It was a pleasure seeing you today! Please do not hesitate to reach out with any questions and or concerns.  Regards,   Yarah Fuente    

## 2022-11-16 NOTE — Assessment & Plan Note (Addendum)
New on exam, very slight.  No active chest pain or sob.  R/o cardiac cause

## 2022-11-16 NOTE — Assessment & Plan Note (Signed)
Work on low cholesterol diet and exercise as tolerated recent lipid panel within recommended limits continue red yeast rice over-the-counter

## 2022-11-16 NOTE — Assessment & Plan Note (Signed)
Advised patient to decrease supplementation from 5000 once daily vitamin D2 01-1998 once daily

## 2022-11-16 NOTE — Assessment & Plan Note (Signed)
Patient Counseling(The following topics were reviewed): ? Preventative care handout given to pt  ?Health maintenance and immunizations reviewed. Please refer to Health maintenance section. ?Pt advised on safe sex, wearing seatbelts in car, and proper nutrition ?labwork ordered today for annual ?Dental health: Discussed importance of regular tooth brushing, flossing, and dental visits. ? ? ?

## 2022-12-09 ENCOUNTER — Encounter: Payer: Self-pay | Admitting: Family

## 2022-12-09 NOTE — Telephone Encounter (Signed)
Can you guys help with this?

## 2023-01-12 ENCOUNTER — Ambulatory Visit: Payer: Commercial Managed Care - PPO

## 2023-01-17 ENCOUNTER — Ambulatory Visit
Admission: RE | Admit: 2023-01-17 | Discharge: 2023-01-17 | Disposition: A | Discharge status: Home / Self Care | Payer: Commercial Managed Care - PPO | Source: Ambulatory Visit | Attending: Family

## 2023-01-17 DIAGNOSIS — R011 Cardiac murmur, unspecified: Secondary | ICD-10-CM | POA: Insufficient documentation

## 2023-01-17 DIAGNOSIS — I351 Nonrheumatic aortic (valve) insufficiency: Secondary | ICD-10-CM | POA: Diagnosis not present

## 2023-01-17 LAB — ECHOCARDIOGRAM COMPLETE
AR max vel: 1.7 cm2
AV Area VTI: 1.83 cm2
AV Area mean vel: 1.83 cm2
AV Mean grad: 3.3 mm[Hg]
AV Peak grad: 6 mm[Hg]
Ao pk vel: 1.22 m/s
Area-P 1/2: 4.89 cm2
MV VTI: 2.25 cm2
S' Lateral: 2.8 cm

## 2023-07-14 ENCOUNTER — Other Ambulatory Visit (HOSPITAL_COMMUNITY): Payer: Self-pay

## 2023-07-14 MED ORDER — OMRON 3 SERIES BP MONITOR DEVI
0 refills | Status: AC
  Filled 2023-07-14: qty 1, 30d supply, fill #0

## 2023-07-28 ENCOUNTER — Other Ambulatory Visit (HOSPITAL_COMMUNITY): Payer: Self-pay

## 2023-08-01 ENCOUNTER — Encounter (HOSPITAL_COMMUNITY): Payer: Self-pay

## 2023-08-01 ENCOUNTER — Other Ambulatory Visit (HOSPITAL_COMMUNITY): Payer: Self-pay

## 2023-08-01 MED ORDER — ESTRADIOL 0.0375 MG/24HR TD PTWK
0.0375 mg | MEDICATED_PATCH | TRANSDERMAL | 4 refills | Status: AC
  Filled 2023-08-01: qty 12, 84d supply, fill #0

## 2023-08-02 ENCOUNTER — Other Ambulatory Visit (HOSPITAL_COMMUNITY): Payer: Self-pay

## 2023-08-08 DIAGNOSIS — Z1501 Genetic susceptibility to malignant neoplasm of breast: Secondary | ICD-10-CM | POA: Diagnosis not present

## 2023-08-08 DIAGNOSIS — Z1509 Genetic susceptibility to other malignant neoplasm: Secondary | ICD-10-CM | POA: Diagnosis not present

## 2023-08-08 DIAGNOSIS — Z7189 Other specified counseling: Secondary | ICD-10-CM | POA: Diagnosis not present

## 2023-08-08 DIAGNOSIS — Z1239 Encounter for other screening for malignant neoplasm of breast: Secondary | ICD-10-CM | POA: Diagnosis not present

## 2023-08-10 ENCOUNTER — Other Ambulatory Visit (HOSPITAL_COMMUNITY): Payer: Self-pay

## 2023-09-07 DIAGNOSIS — Z01818 Encounter for other preprocedural examination: Secondary | ICD-10-CM | POA: Diagnosis not present

## 2023-09-07 DIAGNOSIS — Z1501 Genetic susceptibility to malignant neoplasm of breast: Secondary | ICD-10-CM | POA: Diagnosis not present

## 2023-09-07 DIAGNOSIS — Z1509 Genetic susceptibility to other malignant neoplasm: Secondary | ICD-10-CM | POA: Diagnosis not present

## 2023-09-07 DIAGNOSIS — Z9189 Other specified personal risk factors, not elsewhere classified: Secondary | ICD-10-CM | POA: Diagnosis not present

## 2023-09-07 DIAGNOSIS — Z803 Family history of malignant neoplasm of breast: Secondary | ICD-10-CM | POA: Diagnosis not present

## 2023-10-09 DIAGNOSIS — Z1509 Genetic susceptibility to other malignant neoplasm: Secondary | ICD-10-CM | POA: Diagnosis not present

## 2023-10-09 DIAGNOSIS — Z4001 Encounter for prophylactic removal of breast: Secondary | ICD-10-CM | POA: Diagnosis not present

## 2023-10-09 DIAGNOSIS — Z9013 Acquired absence of bilateral breasts and nipples: Secondary | ICD-10-CM | POA: Diagnosis not present

## 2023-10-09 DIAGNOSIS — Z8041 Family history of malignant neoplasm of ovary: Secondary | ICD-10-CM | POA: Diagnosis not present

## 2023-10-09 DIAGNOSIS — Z0489 Encounter for examination and observation for other specified reasons: Secondary | ICD-10-CM | POA: Diagnosis not present

## 2023-10-09 DIAGNOSIS — Z91041 Radiographic dye allergy status: Secondary | ICD-10-CM | POA: Diagnosis not present

## 2023-10-09 DIAGNOSIS — Z1501 Genetic susceptibility to malignant neoplasm of breast: Secondary | ICD-10-CM | POA: Diagnosis not present

## 2023-10-09 DIAGNOSIS — Z421 Encounter for breast reconstruction following mastectomy: Secondary | ICD-10-CM | POA: Diagnosis not present

## 2023-10-09 DIAGNOSIS — Z803 Family history of malignant neoplasm of breast: Secondary | ICD-10-CM | POA: Diagnosis not present

## 2023-10-11 ENCOUNTER — Other Ambulatory Visit (HOSPITAL_COMMUNITY): Payer: Self-pay

## 2023-10-11 MED ORDER — ONDANSETRON 4 MG PO TBDP
4.0000 mg | ORAL_TABLET | Freq: Three times a day (TID) | ORAL | 0 refills | Status: AC | PRN
  Filled 2023-10-11: qty 10, 4d supply, fill #0

## 2023-10-11 MED ORDER — NAPROXEN 500 MG PO TABS
500.0000 mg | ORAL_TABLET | Freq: Two times a day (BID) | ORAL | 0 refills | Status: AC
  Filled 2023-10-11: qty 14, 7d supply, fill #0

## 2023-10-11 MED ORDER — DOCUSATE SODIUM 100 MG PO CAPS
100.0000 mg | ORAL_CAPSULE | Freq: Two times a day (BID) | ORAL | 0 refills | Status: AC
  Filled 2023-10-11: qty 20, 10d supply, fill #0

## 2023-10-11 MED ORDER — ACETAMINOPHEN 325 MG PO TABS
650.0000 mg | ORAL_TABLET | Freq: Four times a day (QID) | ORAL | 0 refills | Status: AC
  Filled 2023-10-11: qty 80, 10d supply, fill #0

## 2023-10-11 MED ORDER — OXYCODONE HCL 5 MG PO TABS
5.0000 mg | ORAL_TABLET | Freq: Four times a day (QID) | ORAL | 0 refills | Status: AC | PRN
  Filled 2023-10-11: qty 10, 3d supply, fill #0

## 2023-10-11 MED ORDER — GABAPENTIN 100 MG PO CAPS
100.0000 mg | ORAL_CAPSULE | Freq: Three times a day (TID) | ORAL | 7 refills | Status: AC
  Filled 2023-10-11: qty 21, 7d supply, fill #0
  Filled 2023-10-17: qty 21, 7d supply, fill #1
  Filled 2023-10-24: qty 21, 7d supply, fill #2

## 2023-10-11 MED ORDER — LACTULOSE ENCEPHALOPATHY 10 GM/15ML PO SOLN
10.0000 g | Freq: Every day | ORAL | 0 refills | Status: AC
  Filled 2023-10-11: qty 150, 10d supply, fill #0

## 2023-10-18 ENCOUNTER — Other Ambulatory Visit (HOSPITAL_COMMUNITY): Payer: Self-pay

## 2023-10-20 DIAGNOSIS — Z48817 Encounter for surgical aftercare following surgery on the skin and subcutaneous tissue: Secondary | ICD-10-CM | POA: Diagnosis not present

## 2023-10-24 ENCOUNTER — Other Ambulatory Visit (HOSPITAL_COMMUNITY): Payer: Self-pay

## 2023-10-24 DIAGNOSIS — Z1501 Genetic susceptibility to malignant neoplasm of breast: Secondary | ICD-10-CM | POA: Diagnosis not present

## 2023-10-24 DIAGNOSIS — Z48817 Encounter for surgical aftercare following surgery on the skin and subcutaneous tissue: Secondary | ICD-10-CM | POA: Diagnosis not present

## 2023-10-24 DIAGNOSIS — Z1509 Genetic susceptibility to other malignant neoplasm: Secondary | ICD-10-CM | POA: Diagnosis not present

## 2023-10-24 MED ORDER — SILVER SULFADIAZINE 1 % EX CREA
TOPICAL_CREAM | Freq: Two times a day (BID) | CUTANEOUS | 0 refills | Status: AC
  Filled 2023-10-24: qty 50, 15d supply, fill #0

## 2023-11-09 DIAGNOSIS — Z1509 Genetic susceptibility to other malignant neoplasm: Secondary | ICD-10-CM | POA: Diagnosis not present

## 2023-11-09 DIAGNOSIS — Z09 Encounter for follow-up examination after completed treatment for conditions other than malignant neoplasm: Secondary | ICD-10-CM | POA: Diagnosis not present

## 2023-11-09 DIAGNOSIS — Z9189 Other specified personal risk factors, not elsewhere classified: Secondary | ICD-10-CM | POA: Diagnosis not present

## 2023-11-09 DIAGNOSIS — Z1501 Genetic susceptibility to malignant neoplasm of breast: Secondary | ICD-10-CM | POA: Diagnosis not present

## 2023-11-09 DIAGNOSIS — Z15068 Genetic susceptibility to other malignant neoplasm of digestive system: Secondary | ICD-10-CM | POA: Diagnosis not present

## 2023-11-09 DIAGNOSIS — Z1589 Genetic susceptibility to other disease: Secondary | ICD-10-CM | POA: Diagnosis not present

## 2023-11-09 DIAGNOSIS — Z9013 Acquired absence of bilateral breasts and nipples: Secondary | ICD-10-CM | POA: Diagnosis not present

## 2023-11-09 DIAGNOSIS — Z15060 Genetic susceptibility to colorectal cancer: Secondary | ICD-10-CM | POA: Diagnosis not present

## 2023-11-09 DIAGNOSIS — Z803 Family history of malignant neoplasm of breast: Secondary | ICD-10-CM | POA: Diagnosis not present

## 2023-11-21 ENCOUNTER — Other Ambulatory Visit (HOSPITAL_BASED_OUTPATIENT_CLINIC_OR_DEPARTMENT_OTHER): Payer: Self-pay

## 2023-11-21 DIAGNOSIS — Z1509 Genetic susceptibility to other malignant neoplasm: Secondary | ICD-10-CM | POA: Diagnosis not present

## 2023-11-21 DIAGNOSIS — Z9189 Other specified personal risk factors, not elsewhere classified: Secondary | ICD-10-CM | POA: Diagnosis not present

## 2023-11-21 DIAGNOSIS — Z15060 Genetic susceptibility to colorectal cancer: Secondary | ICD-10-CM | POA: Diagnosis not present

## 2023-11-21 DIAGNOSIS — Z15068 Genetic susceptibility to other malignant neoplasm of digestive system: Secondary | ICD-10-CM | POA: Diagnosis not present

## 2023-11-21 DIAGNOSIS — Z1501 Genetic susceptibility to malignant neoplasm of breast: Secondary | ICD-10-CM | POA: Diagnosis not present

## 2023-11-21 DIAGNOSIS — Z1589 Genetic susceptibility to other disease: Secondary | ICD-10-CM | POA: Diagnosis not present

## 2023-11-21 MED ORDER — FLUZONE 0.5 ML IM SUSY
0.5000 mL | PREFILLED_SYRINGE | Freq: Once | INTRAMUSCULAR | 0 refills | Status: AC
  Filled 2023-11-21: qty 0.5, 1d supply, fill #0

## 2023-12-19 ENCOUNTER — Other Ambulatory Visit (HOSPITAL_COMMUNITY): Payer: Self-pay

## 2023-12-19 MED ORDER — ESTRADIOL 0.0375 MG/24HR TD PTWK
0.0375 mg | MEDICATED_PATCH | TRANSDERMAL | 0 refills | Status: DC
  Filled 2023-12-19 – 2024-01-05 (×2): qty 12, 84d supply, fill #0

## 2023-12-20 ENCOUNTER — Other Ambulatory Visit (HOSPITAL_COMMUNITY): Payer: Self-pay

## 2024-01-01 ENCOUNTER — Other Ambulatory Visit (HOSPITAL_COMMUNITY): Payer: Self-pay

## 2024-01-05 ENCOUNTER — Other Ambulatory Visit (HOSPITAL_COMMUNITY): Payer: Self-pay

## 2024-02-07 ENCOUNTER — Other Ambulatory Visit (HOSPITAL_COMMUNITY): Payer: Self-pay

## 2024-02-07 MED ORDER — ESTRADIOL 0.0375 MG/24HR TD PTWK
0.0375 mg | MEDICATED_PATCH | TRANSDERMAL | 3 refills | Status: AC
  Filled 2024-02-07: qty 12, 84d supply, fill #0
